# Patient Record
Sex: Female | Born: 1950
Health system: Southern US, Community
[De-identification: ages and names within clinical notes are randomized; demographics above are authoritative.]

## PROBLEM LIST (undated history)

## (undated) DIAGNOSIS — M7121 Synovial cyst of popliteal space [Baker], right knee: Secondary | ICD-10-CM

## (undated) DIAGNOSIS — E119 Type 2 diabetes mellitus without complications: Secondary | ICD-10-CM

## (undated) DIAGNOSIS — I951 Orthostatic hypotension: Secondary | ICD-10-CM

## (undated) DIAGNOSIS — J019 Acute sinusitis, unspecified: Secondary | ICD-10-CM

## (undated) DIAGNOSIS — E559 Vitamin D deficiency, unspecified: Secondary | ICD-10-CM

## (undated) DIAGNOSIS — G4733 Obstructive sleep apnea (adult) (pediatric): Secondary | ICD-10-CM

## (undated) DIAGNOSIS — R030 Elevated blood-pressure reading, without diagnosis of hypertension: Secondary | ICD-10-CM

## (undated) DIAGNOSIS — I639 Cerebral infarction, unspecified: Secondary | ICD-10-CM

## (undated) DIAGNOSIS — G5601 Carpal tunnel syndrome, right upper limb: Secondary | ICD-10-CM

## (undated) DIAGNOSIS — J9801 Acute bronchospasm: Secondary | ICD-10-CM

## (undated) DIAGNOSIS — J069 Acute upper respiratory infection, unspecified: Secondary | ICD-10-CM

## (undated) DIAGNOSIS — B001 Herpesviral vesicular dermatitis: Secondary | ICD-10-CM

## (undated) DIAGNOSIS — F411 Generalized anxiety disorder: Secondary | ICD-10-CM

## (undated) DIAGNOSIS — R002 Palpitations: Secondary | ICD-10-CM

## (undated) DIAGNOSIS — G44209 Tension-type headache, unspecified, not intractable: Secondary | ICD-10-CM

## (undated) DIAGNOSIS — E785 Hyperlipidemia, unspecified: Secondary | ICD-10-CM

## (undated) DIAGNOSIS — K5790 Diverticulosis of intestine, part unspecified, without perforation or abscess without bleeding: Secondary | ICD-10-CM

## (undated) DIAGNOSIS — F39 Unspecified mood [affective] disorder: Secondary | ICD-10-CM

## (undated) DIAGNOSIS — S93401A Sprain of unspecified ligament of right ankle, initial encounter: Secondary | ICD-10-CM

## (undated) DIAGNOSIS — K219 Gastro-esophageal reflux disease without esophagitis: Secondary | ICD-10-CM

## (undated) DIAGNOSIS — J4530 Mild persistent asthma, uncomplicated: Secondary | ICD-10-CM

## (undated) DIAGNOSIS — R202 Paresthesia of skin: Secondary | ICD-10-CM

## (undated) DIAGNOSIS — I499 Cardiac arrhythmia, unspecified: Secondary | ICD-10-CM

## (undated) DIAGNOSIS — F4321 Adjustment disorder with depressed mood: Secondary | ICD-10-CM

## (undated) DIAGNOSIS — J309 Allergic rhinitis, unspecified: Secondary | ICD-10-CM

## (undated) DIAGNOSIS — R29898 Other symptoms and signs involving the musculoskeletal system: Secondary | ICD-10-CM

## (undated) DIAGNOSIS — R531 Weakness: Secondary | ICD-10-CM

## (undated) DIAGNOSIS — J189 Pneumonia, unspecified organism: Secondary | ICD-10-CM

## (undated) DIAGNOSIS — J45909 Unspecified asthma, uncomplicated: Secondary | ICD-10-CM

## (undated) DIAGNOSIS — G479 Sleep disorder, unspecified: Secondary | ICD-10-CM

## (undated) DIAGNOSIS — F32A Depression, unspecified: Secondary | ICD-10-CM

## (undated) DIAGNOSIS — M9981 Other biomechanical lesions of cervical region: Secondary | ICD-10-CM

## (undated) HISTORY — DX: Obstructive sleep apnea (adult) (pediatric): G47.33

## (undated) HISTORY — DX: Gastro-esophageal reflux disease without esophagitis: K21.9

## (undated) HISTORY — DX: Other symptoms and signs involving the musculoskeletal system: R29.898

## (undated) HISTORY — PX: AUGMENTATION MAMMAPLASTY: SUR837

## (undated) HISTORY — DX: Hyperlipidemia, unspecified: E78.5

## (undated) HISTORY — DX: Cerebral infarction, unspecified: I63.9

## (undated) HISTORY — DX: Other biomechanical lesions of cervical region: M99.81

## (undated) HISTORY — DX: Depression, unspecified: F32.A

## (undated) HISTORY — DX: Mild persistent asthma, uncomplicated: J45.30

## (undated) HISTORY — DX: Paresthesia of skin: R20.2

## (undated) HISTORY — DX: Diverticulosis of intestine, part unspecified, without perforation or abscess without bleeding: K57.90

## (undated) HISTORY — DX: Palpitations: R00.2

## (undated) HISTORY — PX: COLONOSCOPY: SHX174

## (undated) HISTORY — PX: TRANSTHORACIC ECHOCARDIOGRAM: SHX275

---
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
## 1984-08-02 HISTORY — PX: BREAST ENHANCEMENT SURGERY: SHX7

## 1991-08-03 HISTORY — PX: ABDOMINAL HYSTERECTOMY: SHX81

## 1998-07-10 ENCOUNTER — Other Ambulatory Visit: Admission: RE | Admit: 1998-07-10 | Discharge: 1998-07-10 | Payer: Self-pay | Admitting: *Deleted

## 1999-09-07 ENCOUNTER — Other Ambulatory Visit: Admission: RE | Admit: 1999-09-07 | Discharge: 1999-09-07 | Payer: Self-pay | Admitting: *Deleted

## 2000-10-17 ENCOUNTER — Other Ambulatory Visit: Admission: RE | Admit: 2000-10-17 | Discharge: 2000-10-17 | Payer: Self-pay | Admitting: Internal Medicine

## 2001-03-16 ENCOUNTER — Encounter: Payer: Self-pay | Admitting: Cardiology

## 2001-03-16 ENCOUNTER — Inpatient Hospital Stay (HOSPITAL_COMMUNITY): Admission: EM | Admit: 2001-03-16 | Discharge: 2001-03-17 | Payer: Self-pay | Admitting: Emergency Medicine

## 2001-03-16 ENCOUNTER — Encounter: Payer: Self-pay | Admitting: Emergency Medicine

## 2001-03-17 ENCOUNTER — Encounter: Payer: Self-pay | Admitting: Cardiology

## 2009-09-19 ENCOUNTER — Encounter (INDEPENDENT_AMBULATORY_CARE_PROVIDER_SITE_OTHER): Payer: Self-pay | Admitting: *Deleted

## 2010-05-06 ENCOUNTER — Telehealth: Payer: Self-pay | Admitting: Internal Medicine

## 2010-09-03 NOTE — Progress Notes (Signed)
Summary: Schedule Colonoscopy  Phone Note Outgoing Call Call back at [facility] Phone [phone]   Call placed by: Harlow Mares CMA Duncan Dull),  May 06, 2010 10:32 AM Call placed to: Patient Summary of Call: Patients number is disconnected, we will mail them a letter to remind them they are due for their procedure and they need to call back and schedule.   Initial call taken by: Harlow Mares CMA (AAMA),  May 06, 2010 10:32 AM

## 2010-09-03 NOTE — Letter (Signed)
Summary: Colonoscopy Letter  [facility]  [address] [place], [place] [address]   Phone: [phone]  Fax: [phone]      September 19, 2009 MRN: [id number]   LYNDEL DANCEL [address] [place], [place]  [address]   Dear Ms. Larinda Buttery,   According to your medical record, it is time for you to schedule a Colonoscopy. The American Cancer Society recommends this procedure as a method to detect early colon cancer. Patients with a family history of colon cancer, or a personal history of colon polyps or inflammatory bowel disease are at increased risk.  This letter has beeen generated based on the recommendations made at the time of your procedure. If you feel that in your particular situation this may no longer apply, please contact our office.  Please call our office at [phone] to schedule this appointment or to update your records at your earliest convenience.  Thank you for cooperating with [place] to provide you with the very best care possible.   Sincerely,  Wilhemina Bonito. Marina Goodell, M.D.  [facility] [phone]

## 2010-11-06 ENCOUNTER — Emergency Department (INDEPENDENT_AMBULATORY_CARE_PROVIDER_SITE_OTHER): Payer: No Typology Code available for payment source

## 2010-11-06 ENCOUNTER — Emergency Department (HOSPITAL_BASED_OUTPATIENT_CLINIC_OR_DEPARTMENT_OTHER)
Admission: EM | Admit: 2010-11-06 | Discharge: 2010-11-06 | Disposition: A | Discharge status: Home / Self Care | Payer: No Typology Code available for payment source | Attending: Emergency Medicine | Admitting: Emergency Medicine

## 2010-11-06 DIAGNOSIS — M545 Low back pain, unspecified: Secondary | ICD-10-CM | POA: Insufficient documentation

## 2010-11-06 DIAGNOSIS — R079 Chest pain, unspecified: Secondary | ICD-10-CM | POA: Insufficient documentation

## 2010-11-06 DIAGNOSIS — M542 Cervicalgia: Secondary | ICD-10-CM | POA: Insufficient documentation

## 2010-11-06 DIAGNOSIS — E119 Type 2 diabetes mellitus without complications: Secondary | ICD-10-CM | POA: Insufficient documentation

## 2010-11-06 DIAGNOSIS — Y92410 Unspecified street and highway as the place of occurrence of the external cause: Secondary | ICD-10-CM | POA: Insufficient documentation

## 2011-08-30 ENCOUNTER — Ambulatory Visit: Payer: Self-pay

## 2011-08-30 DIAGNOSIS — E1065 Type 1 diabetes mellitus with hyperglycemia: Secondary | ICD-10-CM

## 2011-08-30 DIAGNOSIS — F411 Generalized anxiety disorder: Secondary | ICD-10-CM

## 2011-08-30 DIAGNOSIS — J019 Acute sinusitis, unspecified: Secondary | ICD-10-CM

## 2011-09-05 ENCOUNTER — Telehealth: Payer: Self-pay

## 2011-09-05 NOTE — Telephone Encounter (Signed)
Pt called stating that new diabetes medication is giving her jaw pain and swelling. Pt states it's like lockjaw. Pt would like to know what she should do. She saw Dr. Milus Glazier about a week ago. Please contact pt at [phone].   [facility] in [place]

## 2011-09-06 NOTE — Telephone Encounter (Signed)
Called pt back and gave her instructions from Dr. Milus Glazier. Pt agreed and will RTC Wed.

## 2011-09-06 NOTE — Telephone Encounter (Signed)
Pt states she started taking Onglyza on 1/29 and this past Saturday started having swelling from L ear, all along L jaw into neck and it is painful in this area, as well. No swelling anywhere else, except maybe a little tight in hands. Started taking Amox at same time but has taken several times in past w/ no SEs. Pt stated swelling got a little better, and then worse again after another dose of Onglyza. Pt stopped taking. Please advise.

## 2011-09-06 NOTE — Telephone Encounter (Signed)
Patient to stop the onglyza for 2 days.  Monitor blood sugar.  Return on Wednesday with BS results (bid testing)

## 2011-09-10 ENCOUNTER — Other Ambulatory Visit: Payer: Self-pay | Admitting: Family Medicine

## 2011-09-15 ENCOUNTER — Telehealth: Payer: Self-pay

## 2011-09-15 NOTE — Telephone Encounter (Signed)
Pt has finished antibotic  Thinks she is worse would like more antibotics [phone] [facility], [place], [place]

## 2011-09-16 NOTE — Telephone Encounter (Signed)
SPOKE WITH PATIENT--SHE STATES SHE STILL HAS GREEN MUCOUS AND STILL FEELS CLOGGED UP IN SINUSES AND EARS. PLEASE ADVISE.

## 2011-09-18 ENCOUNTER — Emergency Department (HOSPITAL_BASED_OUTPATIENT_CLINIC_OR_DEPARTMENT_OTHER)
Admission: EM | Admit: 2011-09-18 | Discharge: 2011-09-19 | Disposition: A | Discharge status: Home / Self Care | Payer: Self-pay | Attending: Emergency Medicine | Admitting: Emergency Medicine

## 2011-09-18 ENCOUNTER — Encounter (HOSPITAL_BASED_OUTPATIENT_CLINIC_OR_DEPARTMENT_OTHER): Payer: Self-pay | Admitting: *Deleted

## 2011-09-18 ENCOUNTER — Emergency Department (INDEPENDENT_AMBULATORY_CARE_PROVIDER_SITE_OTHER): Payer: Self-pay

## 2011-09-18 DIAGNOSIS — R51 Headache: Secondary | ICD-10-CM

## 2011-09-18 DIAGNOSIS — M25559 Pain in unspecified hip: Secondary | ICD-10-CM

## 2011-09-18 DIAGNOSIS — W19XXXA Unspecified fall, initial encounter: Secondary | ICD-10-CM

## 2011-09-18 DIAGNOSIS — E119 Type 2 diabetes mellitus without complications: Secondary | ICD-10-CM | POA: Insufficient documentation

## 2011-09-18 DIAGNOSIS — M542 Cervicalgia: Secondary | ICD-10-CM | POA: Insufficient documentation

## 2011-09-18 DIAGNOSIS — S0990XA Unspecified injury of head, initial encounter: Secondary | ICD-10-CM

## 2011-09-18 DIAGNOSIS — W010XXA Fall on same level from slipping, tripping and stumbling without subsequent striking against object, initial encounter: Secondary | ICD-10-CM | POA: Insufficient documentation

## 2011-09-18 DIAGNOSIS — M549 Dorsalgia, unspecified: Secondary | ICD-10-CM

## 2011-09-18 DIAGNOSIS — J45909 Unspecified asthma, uncomplicated: Secondary | ICD-10-CM | POA: Insufficient documentation

## 2011-09-18 DIAGNOSIS — Y92009 Unspecified place in unspecified non-institutional (private) residence as the place of occurrence of the external cause: Secondary | ICD-10-CM | POA: Insufficient documentation

## 2011-09-18 DIAGNOSIS — M79609 Pain in unspecified limb: Secondary | ICD-10-CM | POA: Insufficient documentation

## 2011-09-18 DIAGNOSIS — W1809XA Striking against other object with subsequent fall, initial encounter: Secondary | ICD-10-CM

## 2011-09-18 HISTORY — DX: Cardiac arrhythmia, unspecified: I49.9

## 2011-09-18 MED ORDER — ONDANSETRON 8 MG PO TBDP
ORAL_TABLET | ORAL | Status: AC
  Filled 2011-09-18: qty 1

## 2011-09-18 MED ORDER — ONDANSETRON 8 MG PO TBDP: 8.0000 mg | ORAL_TABLET | Freq: Two times a day (BID) | ORAL | Status: AC | PRN

## 2011-09-18 MED ORDER — HYDROCODONE-ACETAMINOPHEN 5-325 MG PO TABS
2.0000 | ORAL_TABLET | Freq: Once | ORAL | Status: AC
  Administered 2011-09-18: 2 via ORAL

## 2011-09-18 MED ORDER — HYDROCODONE-ACETAMINOPHEN 5-325 MG PO TABS
ORAL_TABLET | ORAL | Status: AC
  Administered 2011-09-18: 2 via ORAL
  Filled 2011-09-18: qty 2

## 2011-09-18 MED ORDER — ONDANSETRON 8 MG PO TBDP
8.0000 mg | ORAL_TABLET | Freq: Once | ORAL | Status: AC
  Administered 2011-09-18: 8 mg via ORAL

## 2011-09-18 MED ORDER — HYDROCODONE-ACETAMINOPHEN 5-325 MG PO TABS: 1.0000 | ORAL_TABLET | ORAL | Status: AC | PRN

## 2011-09-18 NOTE — Discharge Instructions (Signed)
Head Injury, Adult You have had a head injury that does not appear serious at this time. A concussion is a state of changed mental ability, usually from a blow to the head. You should take clear liquids for the rest of the day and then resume your regular diet. You should not take sedatives or alcoholic beverages for as long as directed by your caregiver after discharge. After injuries such as yours, most problems occur within the first 24 hours. SYMPTOMS These minor symptoms may be experienced after discharge:  Memory difficulties.   Dizziness.   Headaches.   Double vision.   Hearing difficulties.   Depression.   Tiredness.   Weakness.   Difficulty with concentration.  If you experience any of these problems, you should not be alarmed. A concussion requires a few days for recovery. Many patients with head injuries frequently experience such symptoms. Usually, these problems disappear without medical care. If symptoms last for more than one day, notify your caregiver. See your caregiver sooner if symptoms are becoming worse rather than better. HOME CARE INSTRUCTIONS   During the next 24 hours you must stay with someone who can watch you for the warning signs listed below.  Although it is unlikely that serious side effects will occur, you should be aware of signs and symptoms which may necessitate your return to this location. Side effects may occur up to 7 - 10 days following the injury. It is important for you to carefully monitor your condition and contact your caregiver or seek immediate medical attention if there is a change in your condition. SEEK IMMEDIATE MEDICAL CARE IF:   There is confusion or drowsiness.   You can not awaken the injured person.   There is nausea (feeling sick to your stomach) or continued, forceful vomiting.   You notice dizziness or unsteadiness which is getting worse, or inability to walk.   You have convulsions or unconsciousness.   You experience  severe, persistent headaches not relieved by over-the-counter or prescription medicines for pain. (Do not take aspirin as this impairs clotting abilities). Take other pain medications only as directed.   You can not use arms or legs normally.   There is clear or bloody discharge from the nose or ears.  MAKE SURE YOU:   Understand these instructions.   Will watch your condition.   Will get help right away if you are not doing well or get worse.  Document Released: 07/19/2005 Document Revised: 03/31/2011 Document Reviewed: 06/06/2009 [facility] Patient Information 2012 [place], [place].   Your blood pressure was mildly elevated today at 152/72. Get your blood pressure rechecked within the next 3 weeks. 8 Tylenol for mild pain or the pain medicine prescribed for bad pain. Return if her condition worsens for any reason

## 2011-09-18 NOTE — ED Notes (Signed)
Pt states she was walking down a ramp and slipped and fell hitting her head. Now c/o head, neck left side pain. Hurts to breathe. Both hips. Placed in c-collar at triage. ?LOC. PERL Ambulatory with limp to ED

## 2011-09-18 NOTE — ED Provider Notes (Addendum)
History    Scribed for Doug Sou, MD, the patient was seen in room MH07/MH07. This chart was scribed by Katha Cabal.   CSN: [id number]  Arrival date & time 09/18/11  2032   None     Chief Complaint  Patient presents with  . Fall    (Consider location/radiation/quality/duration/timing/severity/associated sxs/prior treatment) HPI Wendy Mcclain is a 61 y.o. female who presents to the Emergency Department for fall between 5:30 and 6 PM.  Patient was walking down the ramp on her back porch.  Patient did not see ice on ramp and feet went up from under her. Patient was helped up by a friend.  Patient landed flat on her back.  Patient hit head during fall.   Reports pain in bilateral hips, mid back, left neck and left middle finger.  Patient took ibuprofen prior to arrival.  Pain rated 6/10.  Pain worse with movement and some relief with rest.  Patient was baseline prior to fall.  Patient with history of asthma, diabetes mellitus and heart arrythmia.  Patient is not taking blood thinners.       Past Medical History  Diagnosis Date  . Diabetes mellitus   . Arrhythmia   . Asthma     Past Surgical History  Procedure Date  . Appendectomy   . Abdominal hysterectomy   . Breast surgery     History reviewed. No pertinent family history.  History  Substance Use Topics  . Smoking status: Never Smoker   . Smokeless tobacco: Not on file  . Alcohol Use: No    OB History    Grav Para Term Preterm Abortions TAB SAB Ect Mult Living                  Review of Systems  Constitutional: Negative.   HENT: Positive for neck pain.   Respiratory: Negative.   Cardiovascular: Negative.   Gastrointestinal: Negative.   Musculoskeletal: Positive for back pain.       Bilateral hip pain, left middle finger pain,   Skin: Negative.   Neurological: Negative.   Hematological: Negative.   Psychiatric/Behavioral: Negative.     Allergies  Review of patient's allergies indicates no known  allergies.  Home Medications   Current Outpatient Rx  Name Route Sig Dispense Refill  . GLIPIZIDE 10 MG PO TABS  TAKE ONE TABLET TWICE A DAY 60 tablet 2  . ADULT MULTIVITAMIN W/MINERALS CH Oral Take 1 tablet by mouth daily.    Marland Kitchen NAPROXEN SODIUM 220 MG PO TABS Oral Take 220 mg by mouth once.      BP 167/63  Pulse 80  Temp(Src) 97.8 F (36.6 C) (Oral)  Resp 20  Ht 5\' 3"  (1.6 m)  Wt 132 lb (59.875 kg)  BMI 23.38 kg/m2  SpO2 99%  Physical Exam  Nursing note and vitals reviewed. Constitutional: She appears well-developed and well-nourished.  HENT:  Head: Normocephalic and atraumatic.  Right Ear: Tympanic membrane normal. No hemotympanum.  Left Ear: Tympanic membrane normal. No hemotympanum.       Tender at occiput, no hematoma  Eyes: Conjunctivae are normal. Pupils are equal, round, and reactive to light.  Neck: Neck supple. No tracheal deviation present. No thyromegaly present.       C spine tenderness  Cardiovascular: Normal rate and regular rhythm.   No murmur heard. Pulmonary/Chest: Effort normal and breath sounds normal.       Posterior chest tenderness left greater than right, no flail, no crepitus, no ecchymosis,  Abdominal: Soft. Bowel sounds are normal. She exhibits no distension. There is no tenderness.  Musculoskeletal: Normal range of motion. She exhibits no edema and no tenderness.       Tender over iliac crest bilaterally, hands non tender with FROM bilaterally, extremities non tender   Neurological: She is alert. Coordination normal.       Normal gait,   Skin: Skin is warm and dry. No rash noted.  Psychiatric: She has a normal mood and affect.    ED Course  Procedures (including critical care time)   DIAGNOSTIC STUDIES: Oxygen Saturation is 99% on room air, normal by my interpretation.     COORDINATION OF CARE:  9:59 PM  Physical exam complete.  Pain control.  Will xray pelvis, ribs, CT head and CT cervical spine.   10:01 PM  Norco ordered.      11:40 PM Feels improved after treatment with hydrocodone-A. Pap alert Glasgow Coma Score 15  LABS / RADIOLOGY:   Labs Reviewed - No data to display Dg Ribs Unilateral W/chest Left  09/18/2011  *RADIOLOGY REPORT*  Clinical Data: Status post fall; upper back pain.  LEFT RIBS AND CHEST - 3+ VIEW  Comparison: Chest radiograph performed 11/06/2010  Findings: No displaced rib fractures are seen.  The lungs are well-aerated and clear.  There is no evidence of focal opacification, pleural effusion or pneumothorax.  The cardiomediastinal silhouette is within normal limits.  No acute osseous abnormalities are seen.  Bilateral peripherally calcified breast implants are noted.  IMPRESSION:  1.  No displaced rib fractures seen. 2.  No acute cardiopulmonary process identified.  Original Report Authenticated By: Tonia Ghent, M.D.   Dg Ribs Unilateral W/chest Right  09/18/2011  *RADIOLOGY REPORT*  Clinical Data: Status post fall; landed on back, with back pain.  RIGHT RIBS AND CHEST - 3+ VIEW  Comparison: Chest radiograph performed 11/06/2010  Findings: No displaced rib fractures are seen.  The lungs are well-aerated and clear.  There is no evidence of focal opacification, pleural effusion or pneumothorax.  The cardiomediastinal silhouette is within normal limits.  No acute osseous abnormalities are seen.  Bilateral calcified breast implants are noted.  IMPRESSION:  1.  No displaced rib fractures seen. 2.  No acute cardiopulmonary process identified.  Original Report Authenticated By: Tonia Ghent, M.D.   Dg Pelvis 1-2 Views  09/18/2011  *RADIOLOGY REPORT*  Clinical Data: Status post fall; bilateral hip pain.  PELVIS - 1-2 VIEW  Comparison: Lumbar spine radiographs performed 11/06/2010  Findings: There is no evidence of fracture or dislocation.  Both femoral heads are seated normally within their respective acetabula.  No significant degenerative change is appreciated.  The sacroiliac joints are unremarkable in  appearance.  Sclerotic change is noted at the pubic symphysis.  The visualized bowel gas pattern is grossly unremarkable in appearance.  Scattered phleboliths are noted within the pelvis.  IMPRESSION: No evidence of fracture or dislocation.  Original Report Authenticated By: Tonia Ghent, M.D.   Ct Head Wo Contrast  09/18/2011  *RADIOLOGY REPORT*  Clinical Data:  Status post fall; hit head, with headache and left neck pain.  CT HEAD WITHOUT CONTRAST AND CT CERVICAL SPINE WITHOUT CONTRAST  Technique:  Multidetector CT imaging of the head and cervical spine was performed following the standard protocol without intravenous contrast.  Multiplanar CT image reconstructions of the cervical spine were also generated.  Comparison: Cervical spine radiographs performed 11/06/2010  CT HEAD  Findings: There is no evidence of acute infarction, mass lesion,  or intra- or extra-axial hemorrhage on CT.  The posterior fossa, including the cerebellum, brainstem and fourth ventricle, is within normal limits.  The third and lateral ventricles, and basal ganglia are unremarkable in appearance.  The cerebral hemispheres are symmetric in appearance, with normal gray- white differentiation.  No mass effect or midline shift is seen.  There is no evidence of fracture; visualized osseous structures are unremarkable in appearance.  The orbits are within normal limits. The paranasal sinuses and mastoid air cells are well-aerated.  No significant soft tissue abnormalities are seen.  IMPRESSION: No evidence of traumatic intracranial injury or fracture.  CT CERVICAL SPINE  Findings: There is no evidence of fracture or subluxation. Vertebral bodies demonstrate normal height and alignment. Intervertebral disc spaces are preserved.  Prevertebral soft tissues are within normal limits.  The visualized neural foramina are grossly unremarkable.  The thyroid gland is unremarkable in appearance.  The visualized lung apices are clear.  No significant soft  tissue abnormalities are seen.  IMPRESSION: No evidence of fracture or subluxation along the cervical spine.  Original Report Authenticated By: Tonia Ghent, M.D.   Ct Cervical Spine Wo Contrast  09/18/2011  *RADIOLOGY REPORT*  Clinical Data:  Status post fall; hit head, with headache and left neck pain.  CT HEAD WITHOUT CONTRAST AND CT CERVICAL SPINE WITHOUT CONTRAST  Technique:  Multidetector CT imaging of the head and cervical spine was performed following the standard protocol without intravenous contrast.  Multiplanar CT image reconstructions of the cervical spine were also generated.  Comparison: Cervical spine radiographs performed 11/06/2010  CT HEAD  Findings: There is no evidence of acute infarction, mass lesion, or intra- or extra-axial hemorrhage on CT.  The posterior fossa, including the cerebellum, brainstem and fourth ventricle, is within normal limits.  The third and lateral ventricles, and basal ganglia are unremarkable in appearance.  The cerebral hemispheres are symmetric in appearance, with normal gray- white differentiation.  No mass effect or midline shift is seen.  There is no evidence of fracture; visualized osseous structures are unremarkable in appearance.  The orbits are within normal limits. The paranasal sinuses and mastoid air cells are well-aerated.  No significant soft tissue abnormalities are seen.  IMPRESSION: No evidence of traumatic intracranial injury or fracture.  CT CERVICAL SPINE  Findings: There is no evidence of fracture or subluxation. Vertebral bodies demonstrate normal height and alignment. Intervertebral disc spaces are preserved.  Prevertebral soft tissues are within normal limits.  The visualized neural foramina are grossly unremarkable.  The thyroid gland is unremarkable in appearance.  The visualized lung apices are clear.  No significant soft tissue abnormalities are seen.  IMPRESSION: No evidence of fracture or subluxation along the cervical spine.  Original  Report Authenticated By: Tonia Ghent, M.D.     No diagnosis found.    MDM  Plan prescription hydrocodone-A. Pap Blood pressure recheck within 3 weeks Diagnosis #1 fall #2 minor closed head injury # 3 contusions to multiple sites #4 elevated blood pressure   I personally performed the services described in this documentation, which was scribed in my presence. The recorded information has been reviewed and considered.   Doug Sou, MD 09/18/11 2345  11:50 PM addendum: Patient vomited upon getting up to be discharged, presumably from Vicodin. May be affect of mild concussion. Remains alert and appropriate. Zofran ordered and prescription written  for Zofran  Doug Sou, MD 09/18/11 2356

## 2011-10-27 ENCOUNTER — Encounter: Payer: Self-pay | Admitting: Sports Medicine

## 2011-10-27 ENCOUNTER — Other Ambulatory Visit: Payer: Self-pay | Admitting: Sports Medicine

## 2011-10-27 ENCOUNTER — Ambulatory Visit (INDEPENDENT_AMBULATORY_CARE_PROVIDER_SITE_OTHER): Payer: No Typology Code available for payment source | Admitting: Sports Medicine

## 2011-10-27 VITALS — BP 124/76 | Ht 63.0 in | Wt 133.0 lb

## 2011-10-27 DIAGNOSIS — F39 Unspecified mood [affective] disorder: Secondary | ICD-10-CM

## 2011-10-27 DIAGNOSIS — Z0000 Encounter for general adult medical examination without abnormal findings: Secondary | ICD-10-CM

## 2011-10-27 DIAGNOSIS — E119 Type 2 diabetes mellitus without complications: Secondary | ICD-10-CM

## 2011-10-27 MED ORDER — PAROXETINE HCL 20 MG PO TABS: 20.0000 mg | ORAL_TABLET | ORAL | Status: DC

## 2011-10-27 NOTE — Patient Instructions (Signed)
It was nice to meet you today.  I am sending in a prescription for Paxil to your pharmacy.  Please call your office if you have any problems with this new medication.   Please be in touch with Rudell Cobb regarding the orange card.  We will get labs at your next visit to see how your diabetes is doing.  I will plan on seeing you back in 1 week to check on your mood

## 2011-11-01 ENCOUNTER — Encounter: Payer: Self-pay | Admitting: Internal Medicine

## 2011-11-05 ENCOUNTER — Ambulatory Visit: Payer: No Typology Code available for payment source | Admitting: Sports Medicine

## 2011-11-11 ENCOUNTER — Ambulatory Visit: Payer: No Typology Code available for payment source | Admitting: Sports Medicine

## 2011-11-18 NOTE — Progress Notes (Signed)
HPI:  Wendy Mcclain is a 61 y.o. female presenting today to establish care.  [facility] as well as by Dr. Marina Goodell GI.  She currently reports that she has had depressed mood lately since losing her husband.  She is currently living by herself with plans to move in with her daughter.  She lives in [place] with family nearby.  She has 6 grandchildren.  Significant past medical history includes asthma diabetes reflux multiple benign polyps on colonoscopy, and surgical history including breast augmentation appendectomy, tubal ligation, hysterectomy.    She reports that her mood has been persistently depressed scoring a 7 on the PhD 9 with somewhat difficulty in her life.  She does not feel that this is coping thinning as it has been greater than 4 months.  She reports anhedonia as well as persistent fatigue.  She's had trouble sleeping and has been using "simply sleep."  ROS Negative for chest pain, suicidal ideation, shortness of breath, cough, fevers, chills.  Past Medical Hx Reviewed: yes, as above and in history -  Medications Reviewed: yes Family History Reviewed: yes, please see the history section   PE: GENERAL:    Caucasian  female.  Examined in [facility] .   syncopal in exam room chair   In mild discomfort; norespiratory distress.   PSYCH: Alert and appropriately interactive, tearful when discussing her husband ,  affect is flattened,  HNEENT: AT/[place], MMM, no scleral icterus, EOMi THORAX: HEART: RRR, S1/S2 heard, no murmur LUNGS: CTA B, no wheezes, no crackles ABDOMEN:  +BS, soft, non-tender, no rigidity, no guarding, no masses/organomegaly EXTREMITIES: Moves all 4 extremities spontaneously, warm well perfused, no edema, bilateral DP and PT pulses 2/4.

## 2011-11-23 ENCOUNTER — Encounter: Payer: Self-pay | Admitting: Sports Medicine

## 2011-11-23 DIAGNOSIS — F39 Unspecified mood [affective] disorder: Secondary | ICD-10-CM

## 2011-11-23 DIAGNOSIS — Z0000 Encounter for general adult medical examination without abnormal findings: Secondary | ICD-10-CM | POA: Insufficient documentation

## 2011-11-23 DIAGNOSIS — E119 Type 2 diabetes mellitus without complications: Secondary | ICD-10-CM

## 2011-11-23 HISTORY — DX: Type 2 diabetes mellitus without complications: E11.9

## 2011-11-23 HISTORY — DX: Unspecified mood (affective) disorder: F39

## 2011-11-23 NOTE — Assessment & Plan Note (Addendum)
Will continue glipizide.  Will check A1c at next visit when patient has orange card, reports compliance and her hemoglobin A1c has recently been close to goal

## 2011-11-23 NOTE — Assessment & Plan Note (Signed)
Patient with evidence of subclinical depressive disorder with question of MDD.  Patient is beyond normal coping timeframe and will likely benefit from SSRI.  Will start.  Will followup in one week.

## 2011-11-23 NOTE — Assessment & Plan Note (Addendum)
will plan to perform Pap smear at next visit. Discuss multiple facets of healthcare maintenance with her especially in the setting of her recent loss of her husband patient.  Patient is willing to undergo mammogram and  return for colonoscopy however financial issues have been prohibitive

## 2011-12-11 ENCOUNTER — Other Ambulatory Visit: Payer: Self-pay | Admitting: Physician Assistant

## 2011-12-13 ENCOUNTER — Other Ambulatory Visit: Payer: Self-pay | Admitting: Physician Assistant

## 2012-01-23 ENCOUNTER — Other Ambulatory Visit: Payer: Self-pay | Admitting: Physician Assistant

## 2012-03-01 ENCOUNTER — Ambulatory Visit: Payer: Self-pay | Admitting: Family Medicine

## 2012-03-01 VITALS — BP 120/75 | HR 72 | Temp 98.0°F | Resp 16 | Ht 62.5 in | Wt 131.0 lb

## 2012-03-01 DIAGNOSIS — J329 Chronic sinusitis, unspecified: Secondary | ICD-10-CM

## 2012-03-01 DIAGNOSIS — F3289 Other specified depressive episodes: Secondary | ICD-10-CM

## 2012-03-01 DIAGNOSIS — G4700 Insomnia, unspecified: Secondary | ICD-10-CM

## 2012-03-01 DIAGNOSIS — F329 Major depressive disorder, single episode, unspecified: Secondary | ICD-10-CM

## 2012-03-01 DIAGNOSIS — E119 Type 2 diabetes mellitus without complications: Secondary | ICD-10-CM

## 2012-03-01 DIAGNOSIS — F32A Depression, unspecified: Secondary | ICD-10-CM

## 2012-03-01 DIAGNOSIS — F4321 Adjustment disorder with depressed mood: Secondary | ICD-10-CM

## 2012-03-01 LAB — POCT GLYCOSYLATED HEMOGLOBIN (HGB A1C): Hemoglobin A1C: 9.8

## 2012-03-01 MED ORDER — CITALOPRAM HYDROBROMIDE 20 MG PO TABS: 20.0000 mg | ORAL_TABLET | Freq: Every day | ORAL | Status: AC

## 2012-03-01 MED ORDER — GLIPIZIDE 10 MG PO TABS: 10.0000 mg | ORAL_TABLET | Freq: Two times a day (BID) | ORAL | Status: DC

## 2012-03-01 MED ORDER — ZOLPIDEM TARTRATE 5 MG PO TABS: 5.0000 mg | ORAL_TABLET | Freq: Every evening | ORAL | Status: AC | PRN

## 2012-03-01 MED ORDER — AZITHROMYCIN 250 MG PO TABS: ORAL_TABLET | ORAL | Status: AC

## 2012-03-01 MED ORDER — METFORMIN HCL ER 500 MG PO TB24: 500.0000 mg | ORAL_TABLET | Freq: Every day | ORAL | Status: AC

## 2012-03-01 NOTE — Progress Notes (Signed)
[facility]:  Office Visit  Chief Complaint:  Chief Complaint  Patient presents with  . Diabetes    med refill  . Nasal Congestion    x couple months  . Insomnia    HPI: Wendy Mcclain is a 61 y.o. female who complains of : 1. DM-not on Olngyza, not on metformin, only on glipizide 2. Insmonia-difficulty sleeping only some nights, falling asleep and staying asleep 3. Grieving for husband from cancer, was put on Celexa and paxil but did not take either, afraid of meds  Past Medical History  Diagnosis Date  . Diabetes mellitus   . Arrhythmia   . Asthma    Past Surgical History  Procedure Date  . Appendectomy   . Abdominal hysterectomy   . Breast surgery    History   Social History  . Marital Status: Married    Spouse Name: N/A    Number of Children: N/A  . Years of Education: N/A   Social History Main Topics  . Smoking status: Never Smoker   . Smokeless tobacco: Never Used  . Alcohol Use: No  . Drug Use: No  . Sexually Active: None   Other Topics Concern  . None   Social History Narrative   [place]2 daughters - 1 in [place]; 1 in [place] grandchildren   Family History  Problem Relation Age of Onset  . Aortic aneurysm Paternal Aunt     2 aunts died at age 66  . Alzheimer's disease Mother   . Cancer Father     esophageal  . Cancer Sister     stomach  . Cancer Paternal Grandfather     mouth & larynx  . Cancer Paternal Aunt     colon  . Heart disease    . Diabetes    . Hyperlipidemia    . Hypertension    . Stroke    . Thyroid disease Sister    No Known Allergies Prior to Admission medications   Medication Sig Start Date End Date Taking? Authorizing Provider  glipiZIDE (GLUCOTROL) 10 MG tablet Take 1 tablet (10 mg total) by mouth 2 (two) times daily before a meal. NEEDS OFFICE VISIT/LABS 01/23/12  Yes Chelle S Jeffery, PA-C  Multiple Vitamin (MULITIVITAMIN WITH MINERALS) TABS  Take 1 tablet by mouth daily.   Yes Historical Provider, MD  naproxen sodium (ANAPROX) 220 MG tablet Take 220 mg by mouth once.    Historical Provider, MD  PARoxetine (PAXIL) 20 MG tablet TAKE 1 TABLET BY MOUTH EVERY MORNING 10/27/11   Andrena Mews, DO     ROS: The patient denies fevers, chills, night sweats, unintentional weight loss, chest pain, palpitations, wheezing, dyspnea on exertion, nausea, vomiting, abdominal pain, dysuria, hematuria, melena, numbness, weakness, or tingling.   All other systems have been reviewed and were otherwise negative with the exception of those mentioned in the HPI and as above.    PHYSICAL EXAM: Filed Vitals:   03/01/12 1840  BP: 120/75  Pulse: 72  Temp: 98 F (36.7 C)  Resp: 16   Filed Vitals:   03/01/12 1840  Height: 5' 2.5" (1.588 m)  Weight: 131 lb (59.421 kg)   Body mass index is 23.58 kg/(m^2).  General: Alert, no acute distress HEENT:  Normocephalic, atraumatic, oropharynx patent.  Cardiovascular:  Regular rate and rhythm, no rubs murmurs or gallops.  No Carotid bruits, radial pulse intact. No pedal edema.  Respiratory: Clear to auscultation bilaterally.  No wheezes, rales, or rhonchi.  No cyanosis, no use of accessory musculature GI: No organomegaly, abdomen is soft and non-tender, positive bowel sounds.  No masses. Skin: No rashes. Neurologic: Facial musculature symmetric. Psychiatric: Patient is appropriate throughout our interaction. Lymphatic: No cervical lymphadenopathy Musculoskeletal: Gait intact.   LABS: Results for orders placed in visit on 03/01/12  POCT GLYCOSYLATED HEMOGLOBIN (HGB A1C)      Component Value Range   Hemoglobin A1C 9.8       EKG/XRAY:   Primary read interpreted by Dr. Conley Rolls at [facility].   ASSESSMENT/PLAN: Encounter Diagnoses  Name Primary?  . Diabetes mellitus Yes  . Grieving   . Depression   . Insomnia   . Sinusitis     HBA1c too high 9.8 will restart Metformin ER 500 mg BID Start celexa for  depression ( patient never took it when initially rx, did not go to therapy)  Ambien for insomnia Z-pack for sinusitis F/u 3 months   LE, THAO PHUONG, DO 03/01/2012 8:18 PM

## 2012-03-02 ENCOUNTER — Telehealth: Payer: Self-pay

## 2012-03-02 NOTE — Telephone Encounter (Signed)
Pt did not get the ambien, I have called it to her pharmacy from her visit last pm.

## 2012-03-21 ENCOUNTER — Telehealth: Payer: Self-pay

## 2012-03-21 NOTE — Telephone Encounter (Signed)
Wendy Mcclain saw Dr. Conley Rolls 03/01/12 for sinus infection and is now has drainage from (fluid) in ear and pain in jaw.      [facility] [place].    [phone].

## 2012-03-22 MED ORDER — FLUTICASONE PROPIONATE 50 MCG/ACT NA SUSP: 2.0000 | Freq: Every day | NASAL | Status: DC

## 2012-03-22 NOTE — Telephone Encounter (Signed)
Have patient start Mucinex and I will send in some Flonase as well.

## 2012-03-22 NOTE — Telephone Encounter (Signed)
I have called her to advise.  

## 2012-04-24 ENCOUNTER — Telehealth: Payer: Self-pay

## 2012-04-24 NOTE — Telephone Encounter (Signed)
Patient states she is going on a cruise next week and is requesting ear patches for sea sickness.

## 2012-04-25 MED ORDER — SCOPOLAMINE 1 MG/3DAYS TD PT72: 1.0000 | MEDICATED_PATCH | TRANSDERMAL | Status: DC

## 2012-04-25 NOTE — Telephone Encounter (Signed)
Done

## 2012-06-12 ENCOUNTER — Encounter: Payer: Self-pay | Admitting: Home Health Services

## 2012-06-14 ENCOUNTER — Encounter: Payer: Self-pay | Admitting: Home Health Services

## 2012-09-07 ENCOUNTER — Other Ambulatory Visit: Payer: Self-pay | Admitting: Family Medicine

## 2012-09-07 ENCOUNTER — Other Ambulatory Visit: Payer: Self-pay | Admitting: Physician Assistant

## 2012-09-07 NOTE — Telephone Encounter (Signed)
Needs OV/labs, was due for f/u Oct 2013

## 2012-10-18 ENCOUNTER — Encounter: Payer: Self-pay | Admitting: *Deleted

## 2013-06-05 ENCOUNTER — Encounter: Payer: Self-pay | Admitting: Internal Medicine

## 2013-07-09 ENCOUNTER — Encounter: Payer: Self-pay | Admitting: Sports Medicine

## 2013-07-09 ENCOUNTER — Ambulatory Visit (INDEPENDENT_AMBULATORY_CARE_PROVIDER_SITE_OTHER): Payer: BC Managed Care – PPO | Admitting: Sports Medicine

## 2013-07-09 VITALS — BP 136/68 | HR 83 | Temp 97.9°F | Ht 63.0 in | Wt 146.5 lb

## 2013-07-09 DIAGNOSIS — G479 Sleep disorder, unspecified: Secondary | ICD-10-CM | POA: Insufficient documentation

## 2013-07-09 DIAGNOSIS — R030 Elevated blood-pressure reading, without diagnosis of hypertension: Secondary | ICD-10-CM

## 2013-07-09 DIAGNOSIS — J309 Allergic rhinitis, unspecified: Secondary | ICD-10-CM

## 2013-07-09 DIAGNOSIS — E1165 Type 2 diabetes mellitus with hyperglycemia: Secondary | ICD-10-CM

## 2013-07-09 DIAGNOSIS — F39 Unspecified mood [affective] disorder: Secondary | ICD-10-CM

## 2013-07-09 DIAGNOSIS — Z0000 Encounter for general adult medical examination without abnormal findings: Secondary | ICD-10-CM

## 2013-07-09 HISTORY — DX: Elevated blood-pressure reading, without diagnosis of hypertension: R03.0

## 2013-07-09 HISTORY — DX: Allergic rhinitis, unspecified: J30.9

## 2013-07-09 HISTORY — DX: Sleep disorder, unspecified: G47.9

## 2013-07-09 LAB — POCT UA - MICROALBUMIN: Microalbumin Ur, POC: 30 mg/L

## 2013-07-09 MED ORDER — HYDROCHLOROTHIAZIDE 12.5 MG PO TABS: 12.5000 mg | ORAL_TABLET | Freq: Every day | ORAL | Status: DC

## 2013-07-09 MED ORDER — FLUTICASONE PROPIONATE 50 MCG/ACT NA SUSP: 2.0000 | Freq: Every day | NASAL | Status: DC

## 2013-07-09 MED ORDER — INSULIN GLARGINE 100 UNIT/ML ~~LOC~~ SOLN: 32.0000 [IU] | SUBCUTANEOUS | Status: DC

## 2013-07-09 MED ORDER — DIPHENHYDRAMINE HCL (SLEEP) 25 MG PO TABS: 25.0000 mg | ORAL_TABLET | Freq: Every evening | ORAL | Status: DC | PRN

## 2013-07-09 MED ORDER — ADULT MULTIVITAMIN W/MINERALS CH: 1.0000 | ORAL_TABLET | Freq: Every day | ORAL | Status: DC

## 2013-07-09 NOTE — Progress Notes (Signed)
Wendy Mcclain - 62 y.o. female MRN [id number]  Date of birth: [birth year]  CC, HPI, Interval History & ROS  Wendy Mcclain is here today to followup on her chronic medical conditions including:  Diabetes, Depression, sleep disorder  She reports being followed by Dr. Sharl Ma who has recently started her on insulin therapy due to her A1c being greater than 12.  Pt denies hypoglycemic symptoms/episodes.  Most recent CBGs have been between 140 and 220.  No reported polyuria/polydipsia. Pt is compliant with foot exams and denies any new foot lesions or new sensory changes/dysesthesias.  She reports occasional dizzy spells as below  Pt denies chest pain, dyspnea at rest or exertion, PND, lower extremity edema.  No orthostasis  Patient denies any facial asymmetry, unilateral weakness, or dysarthria.  Dr. Sharl Ma wanted her to be on statin therapy however the patient has declined at this time.  She also reports he continues to have difficulty with falling asleep and sleep maintenance.  She is taking diphenhydramine.  She watches TV while trying to fall asleep.  She does have associated weight gain with the insulin.  She has also noted some fluid retention.  Other acute problems include:  Dizziness and Head congestion She has had 2 vertiginous dizzy spells.  She has a hard time describing this but it seems as though the room will spin when she is out walking.  During these 2 episodes she has checked her sugars and they have both been greater than 140  She associates this with worsening of her congestion.  She has not treated her congestion as discussed last year.  Pt denies any fevers, chills, or rigors.  She denies any significant hearing changes Pertinent History & Care Coordination  No Patient Care Coordination Note on file.  History  Smoking status  . Never Smoker   Smokeless tobacco  . Never Used   Health Maintenance Due  Topic  . Mammogram   . Colonoscopy   . Zostavax   . Influenza Vaccine     Recent Labs  07/09/13 0952  HGBA1C 7.5     Otherwise past Medical, Surgical, Social, and Family History Reviewed per EMR Medications and Allergies reviewed and all updated if necessary. Objective Findings  VITALS: HR: 83 bpm  BP: 136/68 mmHg  TEMP: 97.9 F (36.6 C) (Oral)  RESP:    HT: 5\' 3"  (160 cm)  WT: 146 lb 8 oz (66.452 kg)  BMI: 26   BP Readings from Last 3 Encounters:  07/09/13 136/68  03/01/12 120/75  10/27/11 124/76   Wt Readings from Last 3 Encounters:  07/09/13 146 lb 8 oz (66.452 kg)  03/01/12 131 lb (59.421 kg)  10/27/11 133 lb (60.328 kg)     PHYSICAL EXAM: GENERAL:  adult Caucasian female. In no discomfort; no respiratory distress  PSYCH: alert and appropriate, good insight   HNEENT: H&N: AT/, trachea midline  Eyes: no scleral icterus, no conjunctival exudate  Ears: B serous effusion  Nose: Minimal nasal congestion  Oropharynx: MMM  Dentention:     CARDIO: RRR, S1/S2 heard, no murmur  LUNGS: CTA B, no wheezes, no crackles  ABDOMEN: +BS, soft, non-tender, no rigidity, no guarding, no masses/hepatosplenomegaly  EXTREM:  Warm, well perfused.  Moves all 4 extremities spontaneously; no lateralization.  No noted foot lesions.  Sensation intact to monofilament testing throughout bilateral lower extremity.  Distal pulses 2+/4.  1+ pretibial edema.  GU:   SKIN:      Assessment & Plan   Problems addressed  today: General Plan & Pt Instructions:  1. DM (diabetes mellitus), type 2, uncontrolled   2. Allergic rhinitis   3. Sleep disorder   4. Mood disorder   5. Elevated blood pressure (not hypertension)       Try 5mg  of Melatonin 1 hour bed  See below for sleep behaviors  Call back to Dr. Marina Goodell  Flu Shot today  Start Flonase daily.  Follow up with Dr. Sharl Ma as scheduled  Start your Lipitor that Dr. Sharl Ma recommended  Start HCTZ and come back in 1 month for a BP recheck  If the spinning doesn't get better come back sooner     For further  discussion of A/P and for follow up issues see problem based charting.

## 2013-07-09 NOTE — Assessment & Plan Note (Addendum)
Given elevated blood pressure today and slight weight gain with evidence of edema will start on HCTZ.  If microalbumin positive will need ACE inhibitor as well. Encouraged to fill a prescription for statin that Dr. Sharl Ma had recommended.

## 2013-07-09 NOTE — Assessment & Plan Note (Signed)
Seems to be improved 

## 2013-07-09 NOTE — Assessment & Plan Note (Signed)
Given sleep behavior handout and encouraged melatonin

## 2013-07-09 NOTE — Assessment & Plan Note (Signed)
Rx for Flonase again

## 2013-07-09 NOTE — Assessment & Plan Note (Signed)
Encouraged to get her mammogram She is trying to get her colonoscopy arranged. Flu shot today

## 2013-07-09 NOTE — Assessment & Plan Note (Addendum)
Check microalbumin Deferr tx to Dr. Sharl Ma Consider other oral agents given weight gain with insulin

## 2013-07-09 NOTE — Patient Instructions (Signed)
   Try 5mg  of Melatonin 1 hour bed  See below for sleep behaviors  Call back to Dr. Marina Goodell  Flu Shot today  Start Flonase daily.  Follow up with Dr. Sharl Ma as scheduled  Start your Lipitor that Dr. Sharl Ma recommended  Start HCTZ and come back in 1 month for a BP recheck  If the spinning doesn't get better come back sooner    If you need anything prior to your next visit please call the clinic. Please Bring all medications or accurate medication list with you to each appointment; an accurate medication list is essential in providing you the best care possible.     Sleep is an integral part of our bodies ability to recover from our daily activities and is key to making you body perform at its maximal potential.  Establishing and maintaining a healthy sleep pattern can not only make you feel better it can help many chronic illnesses including high blood pressure, high cholesterol, obesity, chronic pain syndromes, and many others.  Some key things to remember regarding sleep are:  Establish a consistent nightly routine that you do each night before bed.  Avoid caffeine, tobacco and alcohol as all of these drugs will cause sleep disturbances.    Establish an exercise routine.  This can be as simple as walking, dancing or what every you find gets your heart rate elevated to the point you can speak in only 3-4 word sentences.  Exercise will help make falling and staying asleep easier.    If you have difficulty with sleep, reserve the bedroom for sleeping; do not watch TV, read, eat or exercise in your bed room.      - Watching TV in bed can trick your brain into thinking it is day time and will reset your internal clock.  If you are going to watch TV before bed do so outside of the bedroom.  Although it is best to avoid screens for the hour prior to bed that is difficult to do in our current technology driven society - at the very least it is imperative that you remove TV from the bed room.  - If you have a hard time falling asleep avoid showering and exercising prior to bed.

## 2013-07-10 ENCOUNTER — Telehealth: Payer: Self-pay | Admitting: *Deleted

## 2013-07-10 NOTE — Telephone Encounter (Signed)
Dr Berline Chough microalbumin was done waiting on the results. Aj Crunkleton, Virgel Bouquet

## 2013-07-10 NOTE — Telephone Encounter (Signed)
Message copied by Tanna Savoy on Tue Jul 10, 2013  8:54 AM ------      Message from: Gaspar Bidding D      Created: Mon Jul 09, 2013  6:11 PM       Did we get the microalbumin? ------

## 2013-07-16 NOTE — Progress Notes (Signed)
Normal microalbumin - no ACE indicated

## 2013-07-18 ENCOUNTER — Telehealth: Payer: Self-pay | Admitting: Sports Medicine

## 2013-07-18 ENCOUNTER — Ambulatory Visit (AMBULATORY_SURGERY_CENTER): Payer: Self-pay | Admitting: *Deleted

## 2013-07-18 VITALS — Ht 63.0 in | Wt 148.6 lb

## 2013-07-18 DIAGNOSIS — Z1211 Encounter for screening for malignant neoplasm of colon: Secondary | ICD-10-CM

## 2013-07-18 MED ORDER — MOVIPREP 100 G PO SOLR: ORAL | Status: DC

## 2013-07-18 MED ORDER — AZITHROMYCIN 250 MG PO TABS: ORAL_TABLET | ORAL | Status: DC

## 2013-07-18 NOTE — Progress Notes (Signed)
No allergies to eggs or soy. Nausea after anesthesia.

## 2013-07-18 NOTE — Telephone Encounter (Signed)
Spoke with patient and informed her of below 

## 2013-07-18 NOTE — Telephone Encounter (Signed)
Called in Z-pac per pt request; has been >10 days. Pt will need further follow up if not improving.

## 2013-07-18 NOTE — Telephone Encounter (Signed)
Was given flonase for allergies but has symptons of sinus infection. settleing in chest with a cough.  Can  Dr Berline Chough call in a z-pack?

## 2013-07-19 ENCOUNTER — Encounter: Payer: Self-pay | Admitting: Internal Medicine

## 2013-07-19 ENCOUNTER — Encounter: Payer: Self-pay | Admitting: Gastroenterology

## 2013-07-24 ENCOUNTER — Encounter: Payer: Self-pay | Admitting: Internal Medicine

## 2013-07-30 ENCOUNTER — Encounter: Payer: Self-pay | Admitting: Internal Medicine

## 2013-08-01 ENCOUNTER — Ambulatory Visit (AMBULATORY_SURGERY_CENTER): Payer: BC Managed Care – PPO | Admitting: Internal Medicine

## 2013-08-01 ENCOUNTER — Encounter: Payer: Self-pay | Admitting: Internal Medicine

## 2013-08-01 ENCOUNTER — Encounter: Payer: BC Managed Care – PPO | Admitting: Internal Medicine

## 2013-08-01 VITALS — BP 133/76 | HR 60 | Temp 97.2°F | Resp 12 | Ht 63.0 in | Wt 148.0 lb

## 2013-08-01 DIAGNOSIS — Z1211 Encounter for screening for malignant neoplasm of colon: Secondary | ICD-10-CM

## 2013-08-01 LAB — GLUCOSE, CAPILLARY

## 2013-08-01 MED ORDER — SODIUM CHLORIDE 0.9 % IV SOLN: 500.0000 mL | INTRAVENOUS | Status: DC

## 2013-08-01 NOTE — Op Note (Signed)
[facility] [address] [place], [address]   COLONOSCOPY PROCEDURE REPORT  PATIENT: Wendy Mcclain, Wendy Mcclain  MR#: [id number] BIRTHDATE: [birth year] , 62  yrs. old GENDER: Female ENDOSCOPIST: Roxy Cedar, MD REFERRED [id number] Recall PROCEDURE DATE:  08/01/2013 PROCEDURE:   Colonoscopy, screening First Screening Colonoscopy - Avg.  risk and is 50 yrs.  old or older - No.  Prior Negative Screening - Now for repeat screening. 10 or more years since last screening  History of Adenoma - Now for follow-up colonoscopy & has been > or = to 3 yrs.  N/A  Polyps Removed Today? No.  Recommend repeat exam, <10 yrs? No. ASA CLASS:   Class II INDICATIONS:average risk screening. MEDICATIONS: MAC sedation, administered by CRNA and propofol (Diprivan) 200mg  IV  DESCRIPTION OF PROCEDURE:   After the risks benefits and alternatives of the procedure were thoroughly explained, informed consent was obtained.  A digital rectal exam revealed no abnormalities of the rectum.   The LB [id number]  endoscope was introduced through the anus and advanced to the cecum, which was identified by both the appendix and ileocecal valve. No adverse events experienced.   The quality of the prep was good, using MoviPrep  The instrument was then slowly withdrawn as the colon was fully examined.      COLON FINDINGS: Moderate diverticulosis was noted The finding was in the right colon and The finding was left colon.   The colon mucosa was otherwise normal.  Retroflexed views revealed internal hemorrhoids. The time to cecum=3 minutes 28 seconds.  Withdrawal time=10 minutes 45 seconds.  The scope was withdrawn and the procedure completed.  COMPLICATIONS: There were no complications.  ENDOSCOPIC IMPRESSION: 1.   Moderate diverticulosis was noted in the right colon and left colon 2.   The colon mucosa was otherwise normal  RECOMMENDATIONS: 1. Continue current colorectal screening  recommendations for "routine risk" patients with a repeat colonoscopy in 10 years.   eSigned:  Roxy Cedar, MD 08/01/2013 10:10 AM   cc: The Patient    Flo Shanks, DO [facility])

## 2013-08-01 NOTE — Patient Instructions (Signed)

## 2013-08-01 NOTE — Progress Notes (Signed)
Patient did not experience any of the following events: a burn prior to discharge; a fall within the facility; wrong site/side/patient/procedure/implant event; or a hospital transfer or hospital admission upon discharge from the facility. (G8907)  

## 2013-08-03 ENCOUNTER — Telehealth: Payer: Self-pay

## 2013-08-03 NOTE — Telephone Encounter (Signed)
  Follow up Call-  Call back number 08/01/2013  Post procedure Call Back phone  # [phone]  Permission to leave phone message Yes     Patient questions:  Do you have a fever, pain , or abdominal swelling? no Pain Score  0 *  Have you tolerated food without any problems? yes  Have you been able to return to your normal activities? yes  Do you have any questions about your discharge instructions: Diet   no Medications  no Follow up visit  no  Do you have questions or concerns about your Care? no  Actions: * If pain score is 4 or above: No action needed, pain <4.

## 2013-08-13 ENCOUNTER — Encounter: Payer: Self-pay | Admitting: Internal Medicine

## 2013-11-02 NOTE — Telephone Encounter (Signed)
Procedure done/yf

## 2014-02-13 ENCOUNTER — Ambulatory Visit (INDEPENDENT_AMBULATORY_CARE_PROVIDER_SITE_OTHER): Admitting: Emergency Medicine

## 2014-02-13 ENCOUNTER — Ambulatory Visit (INDEPENDENT_AMBULATORY_CARE_PROVIDER_SITE_OTHER)

## 2014-02-13 VITALS — BP 140/88 | HR 86 | Temp 97.4°F | Resp 16 | Ht 62.5 in | Wt 143.8 lb

## 2014-02-13 DIAGNOSIS — R079 Chest pain, unspecified: Secondary | ICD-10-CM

## 2014-02-13 DIAGNOSIS — R05 Cough: Secondary | ICD-10-CM

## 2014-02-13 DIAGNOSIS — R059 Cough, unspecified: Secondary | ICD-10-CM

## 2014-02-13 DIAGNOSIS — S46819A Strain of other muscles, fascia and tendons at shoulder and upper arm level, unspecified arm, initial encounter: Secondary | ICD-10-CM

## 2014-02-13 DIAGNOSIS — S43499A Other sprain of unspecified shoulder joint, initial encounter: Secondary | ICD-10-CM

## 2014-02-13 MED ORDER — NAPROXEN SODIUM 550 MG PO TABS: 550.0000 mg | ORAL_TABLET | Freq: Two times a day (BID) | ORAL | Status: AC

## 2014-02-13 MED ORDER — CYCLOBENZAPRINE HCL 10 MG PO TABS: 10.0000 mg | ORAL_TABLET | Freq: Three times a day (TID) | ORAL | Status: DC | PRN

## 2014-02-13 NOTE — Progress Notes (Signed)
[facility] [address], [place] [address] [phone]  Date:  02/13/2014   Name:  Wendy Mcclain   DOB:  Apr 11, 1951   MRN:  [id number]  PCP:  Janora Norlander, DO    Chief Complaint: Chest Pain   History of Present Illness:  Wendy Mcclain is a 63 y.o. very pleasant female patient who presents with the following:  Patient comes in complaining of pain across upper back intermittently over the past month.  She has an intermittent non productive cough. No wheezing or shortness of breath.  No nausea or vomiting.  No stool change.  Her pain is not pleuritic and lasts only a short time of 20 minutes and eases.  Not associated with diet or activity.  No fever or chills.  No stool change.  No rash.  Has NIDDM and borderline blood pressure elevation not on medication.  Has some nasal congestion.  No drainage or post nasal drip.  No sore throat.  No history of back injury or overuse.  Has been on vacation. No improvement with over the counter medications or other home remedies. jd   Patient Active Problem List   Diagnosis Date Noted  . Allergic rhinitis 07/09/2013  . Sleep disorder 07/09/2013  . Elevated blood pressure (not hypertension) 07/09/2013  . Mood disorder 11/23/2011  . DM (diabetes mellitus), type 2, uncontrolled 11/23/2011  . Health care maintenance 11/23/2011    Past Medical History  Diagnosis Date  . Diabetes mellitus   . Arrhythmia   . Asthma   . GERD (gastroesophageal reflux disease)     Past Surgical History  Procedure Laterality Date  . Appendectomy  1963  . Abdominal hysterectomy  1993  . Breast enhancement surgery  1986    History  Substance Use Topics  . Smoking status: Never Smoker   . Smokeless tobacco: Never Used  . Alcohol Use: No    Family History  Problem Relation Age of Onset  . Aortic aneurysm Paternal Aunt     2 aunts died at age 26  . Alzheimer's disease Mother   . Cancer Father     esophageal  . Cancer Sister      stomach  . Cancer Paternal Grandfather     mouth & larynx  . Cancer Paternal Aunt     colon  . Heart disease    . Diabetes    . Hyperlipidemia    . Hypertension    . Stroke    . Thyroid disease Sister   . Colon cancer Neg Hx     Allergies  Allergen Reactions  . Metformin And Related Diarrhea    Medication list has been reviewed and updated.  Current Outpatient Prescriptions on File Prior to Visit  Medication Sig Dispense Refill  . azithromycin (ZITHROMAX) 250 MG tablet Take 2 tablets today, then 1 tablet daily  6 tablet  0  . diphenhydrAMINE (SOMINEX) 25 MG tablet Take 1 tablet (25 mg total) by mouth at bedtime as needed for sleep.  30 tablet  0  . fluticasone (FLONASE) 50 MCG/ACT nasal spray Place 2 sprays into both nostrils daily.  16 g  6  . glipiZIDE (GLUCOTROL) 10 MG tablet TAKE 1 TABLET BY MOUTH TWICE DAILY BEFORE A MEAL  60 tablet  0  . hydrochlorothiazide (HYDRODIURIL) 12.5 MG tablet Take 1 tablet (12.5 mg total) by mouth daily.  30 tablet  2  . insulin glargine (LANTUS) 100 UNIT/ML injection Inject 0.32 mLs (  32 Units total) into the skin every morning.  10 mL  12  . Multiple Vitamin (MULTIVITAMIN WITH MINERALS) TABS tablet Take 1 tablet by mouth daily.       No current facility-administered medications on file prior to visit.    Review of Systems:  As per HPI, otherwise negative.    Physical Examination: Filed Vitals:   02/13/14 2028  BP: 140/88  Pulse: 86  Temp: 97.4 F (36.3 C)  Resp: 16   Filed Vitals:   02/13/14 2028  Height: 5' 2.5" (1.588 m)  Weight: 143 lb 12.8 oz (65.227 kg)   Body mass index is 25.87 kg/(m^2). Ideal Body Weight: Weight in (lb) to have BMI = 25: 138.6  GEN: WDWN, NAD, Non-toxic, A & O x 3  Persistent cough HEENT: Atraumatic, Normocephalic. Neck supple. No masses, No LAD. Ears and Nose: No external deformity. CV: RRR, No M/G/R. No JVD. No thrill. No extra heart sounds. PULM: CTA B, no wheezes, crackles, rhonchi. No  retractions. No resp. distress. No accessory muscle use. ABD: S, NT, ND, +BS. No rebound. No HSM. EXTR: No c/c/e NEURO Normal gait.  PSYCH: Normally interactive. Conversant. Not depressed or anxious appearing.  Calm demeanor.  BACK:  Tenderness in trapezius muscles across upper back.   Assessment and Plan: Back pain Anaprox Flexeril Cough CXR  Signed,  Ellison Carwin, MD   [facility] reading (PRIMARY) by  Dr. Ouida Sills. negative.

## 2014-03-20 ENCOUNTER — Ambulatory Visit (INDEPENDENT_AMBULATORY_CARE_PROVIDER_SITE_OTHER): Admitting: Family Medicine

## 2014-03-20 ENCOUNTER — Encounter: Payer: Self-pay | Admitting: Family Medicine

## 2014-03-20 DIAGNOSIS — Z79899 Other long term (current) drug therapy: Secondary | ICD-10-CM

## 2014-03-20 DIAGNOSIS — IMO0002 Reserved for concepts with insufficient information to code with codable children: Secondary | ICD-10-CM

## 2014-03-20 DIAGNOSIS — B009 Herpesviral infection, unspecified: Secondary | ICD-10-CM

## 2014-03-20 DIAGNOSIS — Z0000 Encounter for general adult medical examination without abnormal findings: Secondary | ICD-10-CM

## 2014-03-20 DIAGNOSIS — Z1159 Encounter for screening for other viral diseases: Secondary | ICD-10-CM

## 2014-03-20 DIAGNOSIS — F39 Unspecified mood [affective] disorder: Secondary | ICD-10-CM

## 2014-03-20 DIAGNOSIS — IMO0001 Reserved for inherently not codable concepts without codable children: Secondary | ICD-10-CM

## 2014-03-20 DIAGNOSIS — J309 Allergic rhinitis, unspecified: Secondary | ICD-10-CM

## 2014-03-20 DIAGNOSIS — G479 Sleep disorder, unspecified: Secondary | ICD-10-CM

## 2014-03-20 DIAGNOSIS — E1165 Type 2 diabetes mellitus with hyperglycemia: Secondary | ICD-10-CM

## 2014-03-20 DIAGNOSIS — Z1321 Encounter for screening for nutritional disorder: Secondary | ICD-10-CM

## 2014-03-20 DIAGNOSIS — B001 Herpesviral vesicular dermatitis: Secondary | ICD-10-CM

## 2014-03-20 DIAGNOSIS — K219 Gastro-esophageal reflux disease without esophagitis: Secondary | ICD-10-CM | POA: Diagnosis not present

## 2014-03-20 DIAGNOSIS — R030 Elevated blood-pressure reading, without diagnosis of hypertension: Secondary | ICD-10-CM

## 2014-03-20 DIAGNOSIS — Z1329 Encounter for screening for other suspected endocrine disorder: Secondary | ICD-10-CM

## 2014-03-20 DIAGNOSIS — Z13228 Encounter for screening for other metabolic disorders: Secondary | ICD-10-CM

## 2014-03-20 DIAGNOSIS — Z130 Encounter for screening for diseases of the blood and blood-forming organs and certain disorders involving the immune mechanism: Secondary | ICD-10-CM

## 2014-03-20 DIAGNOSIS — Z114 Encounter for screening for human immunodeficiency virus [HIV]: Secondary | ICD-10-CM

## 2014-03-20 HISTORY — DX: Gastro-esophageal reflux disease without esophagitis: K21.9

## 2014-03-20 HISTORY — DX: Herpesviral vesicular dermatitis: B00.1

## 2014-03-20 LAB — POCT GLYCOSYLATED HEMOGLOBIN (HGB A1C): HEMOGLOBIN A1C: 7.6

## 2014-03-20 MED ORDER — ALBUTEROL SULFATE HFA 108 (90 BASE) MCG/ACT IN AERS: 2.0000 | INHALATION_SPRAY | Freq: Four times a day (QID) | RESPIRATORY_TRACT | Status: DC | PRN

## 2014-03-20 MED ORDER — OMEPRAZOLE 20 MG PO CPDR: 20.0000 mg | DELAYED_RELEASE_CAPSULE | Freq: Every day | ORAL | Status: DC

## 2014-03-20 MED ORDER — FLUTICASONE PROPIONATE 50 MCG/ACT NA SUSP: 2.0000 | Freq: Every day | NASAL | Status: DC

## 2014-03-20 NOTE — Assessment & Plan Note (Addendum)
HgbA1c: 7.6 today.  CBG avg 122.   -Patient to continue current regimen of Lantus 20u with Invokana 300mg  qd as directed. -Patient to f/u with Dr Buddy Duty (endocrinologist) in October -Continue to monitor CBGs daily.  -Patient denies any hypoglycemic episodes but has been counseled on what to do with low CBGs -lipid panel

## 2014-03-20 NOTE — Assessment & Plan Note (Addendum)
Stable. -Well controlled with Sominex PRN (diphenhydramine)

## 2014-03-20 NOTE — Patient Instructions (Signed)
It was such a pleasure meeting you today, Wendy Mcclain!  Please make sure to schedule an appointment with the lab so that you can get your FASTING labs done.  I will contact you with these results.  Plan to followup with Dr Buddy Duty regarding your diabetes medications.  Plan to schedule you Mammogram.  You should try Abreva or its generic for the blisters on your lip.  Cold Sore A cold sore (fever blister) is a skin infection caused by a certain type of germ (virus). They are small sores filled with fluid that dry up and heal within 2 weeks. Cold sores form inside of the mouth or on the lips, gums, and other parts of the body. Cold sores can be easily passed (contagious) to other people. This can happen through close personal contact, such as kissing or sharing a drinking glass. HOME CARE  Only take medicine as told by your doctor. Do not use aspirin.  Use a cotton-tip swab to put creams or gels on your sores.  Do not touch sores or pick scabs. Wash your hands often. Do not touch your eyes without washing your hands first.  Avoid kissing, oral sex, and sharing personal items until the sores heal.  Put an ice pack on your sores for 10-15 minutes to ease discomfort.  Avoid hot, cold, or salty foods. Eat a soft, bland diet. Use a straw to drink if it helps lessen pain.  Keep sores clean and dry.  Avoid the sun and limit stress if these things cause you to have sores. Apply sunscreen on your lips if the sun causes cold sores. GET HELP IF:  You have a fever or lasting symptoms for more than 2-3 days.  You have a fever and your symptoms suddenly get worse.  You have yellow-white fluid (not clear) coming from the sores.  You have redness that is spreading.  You have pain or irritation in your eye.  You get sores on your genitals.  Your sores do not heal within 2 weeks.  You have a tough time fighting off sickness and infections (weakened immune system).  You get cold sores often. MAKE SURE  YOU:   Understand these instructions.  Will watch your condition.  Will get help right away if you are not doing well or get worse. Document Released: 01/18/2012 Document Reviewed: 01/18/2012 [facility] Patient Information 2015 [place]. This information is not intended to replace advice given to you by your health care provider. Make sure you discuss any questions you have with your health care provider.

## 2014-03-20 NOTE — Assessment & Plan Note (Addendum)
Patient reports increased allergies- post nasal drip, non productive cough, wheeze.  Patient reports a history of allergy associated asthma. -Continue use of Flonase NS as directed -Claritin or Zyrtec qd -Albuterol HFA for allergy induced asthma -Patient to seek medical attention if she becomes febrile, develops uncontrolled SOB or if needs to use her inhaler more than 1-2x/week.

## 2014-03-20 NOTE — Assessment & Plan Note (Signed)
BP stable with no medications. -continue to monitor.

## 2014-03-20 NOTE — Assessment & Plan Note (Signed)
Patient to schedule annual mammogram, eye appointment.

## 2014-03-20 NOTE — Assessment & Plan Note (Addendum)
3 month h/o of fever blister on upper and lower lip -Apply abreva to affected area as directed. -Information given on fever blister and care -Will reassess in 2-3 weeks for resolution

## 2014-03-20 NOTE — Progress Notes (Signed)
Patient ID: Wendy Mcclain, female   DOB: February 28, 1951, 63 y.o.   MRN: [id number]      Subjective: CC: annual wellness exam HPI: Patient is a 63 y.o. female presenting to clinic today for annual physical. Concerns today include:  1. Fever blister: Patient states that she developed a fever blister on her upper and lower lip about 4-5 months ago.  She states that her sister had one and that she thinks that's where she got it.  She denies fever but does admit to intermittent postnasal drip and cough.  2. Acid reflux: Patient reports that she has acid reflux 3-4x weekly.  Relieved by OTC omeprazole.  Admits to belching and bloating.  Denies n/v. She is requesting a formal Rx for Omeprazole today.  3. Allergies Patient states that she has only been intermittently using her Flonase.  She reports that she has had an intermittent postnasal drip that causes her to cough at night.  Denies fevers, sinus pressure, headache.  Denies sick contacts (except for sister with fever blister).  4. T2DM: Patient is taking Invokana 300mg  in conjunction with 20u of Lantus.  She states that CBGs 118-130 in the am with an average 122.  Denies any hypoglycemic episodes since lowering Lantus to accommodate Invokana dose increase.  She also denies dysuria, polyuria (outside of normal), or hematuria.  She is managed by her endocrinologist Dr Buddy Duty.  Appt scheduled for October.  History Reviewed: non smoker. Health Maintenance: Mammogram to be scheduled, saw Dentist this year, to schedule Eye exam, Received Pneumoccocal vaccine at another facility last year.  Social Hx UTD see EMR SurgHx UTD see EMR Family Hx UTD see EMR  ROS: All other systems reviewed and are negative.  Objective: Office vital signs reviewed.  BP 129/79 HR 77 Wt 142 Temp 97.26F  Physical Exam  Constitutional: She is oriented to person, place, and time and well-developed, well-nourished, and in no distress. No distress.  HENT:  Head:  Normocephalic and atraumatic.  Nose: Nose normal.  Mouth/Throat: Oropharynx is clear and moist. No oropharyngeal exudate.  Small blister on lower lip consistent with herpes labialis lesion. Non exudative, non bleeding  Eyes: Conjunctivae and EOM are normal. Pupils are equal, round, and reactive to light. No scleral icterus.  Neck: Normal range of motion. Neck supple. No JVD present. No tracheal deviation present. No thyromegaly present.  Cardiovascular: Normal rate, regular rhythm, normal heart sounds and intact distal pulses.  Exam reveals no gallop and no friction rub.   No murmur heard. Pulmonary/Chest: Effort normal. No stridor. No respiratory distress. She has no wheezes. She has no rales. She exhibits no tenderness.  Abdominal: Soft. Bowel sounds are normal. She exhibits no distension and no mass. There is no tenderness. There is no rebound and no guarding.  Musculoskeletal: Normal range of motion. She exhibits no edema and no tenderness.  Lymphadenopathy:    She has no cervical adenopathy.  Neurological: She is alert and oriented to person, place, and time. She has normal reflexes. She displays normal reflexes. No cranial nerve deficit. She exhibits normal muscle tone. Gait normal. Coordination normal.  Skin: Skin is warm and dry. No rash noted. She is not diaphoretic. No erythema. No pallor.  1 round non exudative non bleeding lesion on upper and lower lip concordant with herpes labialis   Psychiatric: Mood, memory, affect and judgment normal.   Assessment: 63 y.o. female with: 1. Herpes labialis 2. Gastro-esophageal reflux 3. Asthma secondary to allergic rhinitis  4. T2DM  Plan: See Problem List and After Visit Summary   Janora Norlander, DO PGY-1, [place]

## 2014-03-20 NOTE — Assessment & Plan Note (Signed)
Experiencing heart burn 3-4 times weekly- relieved by OTC Omeprazole 20mg . -Continue to take Omeprazole 20mg  daily. -Will consider testing for h.pylori at a future visit.

## 2014-03-20 NOTE — Assessment & Plan Note (Signed)
Patient is stable. -Admits to intermittent depression regarding her husband's passing.  However, is otherwise doing well. -Patient reassured and informed of various resources available at [facility].   -Patient will let me know if she feels like she needs counseling or medication. -Continue to monitor going forward.

## 2014-03-21 MED ORDER — FLUTICASONE PROPIONATE 50 MCG/ACT NA SUSP: 2.0000 | Freq: Every day | NASAL | Status: DC

## 2014-03-22 NOTE — Progress Notes (Signed)
Reviewed

## 2014-03-26 ENCOUNTER — Ambulatory Visit (INDEPENDENT_AMBULATORY_CARE_PROVIDER_SITE_OTHER): Admitting: Internal Medicine

## 2014-03-26 ENCOUNTER — Other Ambulatory Visit

## 2014-03-26 VITALS — BP 120/78 | HR 69 | Temp 97.4°F | Resp 16 | Ht 63.0 in | Wt 141.4 lb

## 2014-03-26 DIAGNOSIS — Z114 Encounter for screening for human immunodeficiency virus [HIV]: Secondary | ICD-10-CM

## 2014-03-26 DIAGNOSIS — J020 Streptococcal pharyngitis: Secondary | ICD-10-CM

## 2014-03-26 DIAGNOSIS — Z1321 Encounter for screening for nutritional disorder: Secondary | ICD-10-CM

## 2014-03-26 DIAGNOSIS — R059 Cough, unspecified: Secondary | ICD-10-CM

## 2014-03-26 DIAGNOSIS — R05 Cough: Secondary | ICD-10-CM

## 2014-03-26 DIAGNOSIS — Z79899 Other long term (current) drug therapy: Secondary | ICD-10-CM

## 2014-03-26 DIAGNOSIS — E1165 Type 2 diabetes mellitus with hyperglycemia: Secondary | ICD-10-CM

## 2014-03-26 DIAGNOSIS — IMO0002 Reserved for concepts with insufficient information to code with codable children: Secondary | ICD-10-CM

## 2014-03-26 DIAGNOSIS — Z1329 Encounter for screening for other suspected endocrine disorder: Secondary | ICD-10-CM

## 2014-03-26 LAB — CBC WITH DIFFERENTIAL/PLATELET
BASOS PCT: 1 % (ref 0–1)
Basophils Absolute: 0.1 10*3/uL (ref 0.0–0.1)
EOS ABS: 0.2 10*3/uL (ref 0.0–0.7)
Eosinophils Relative: 4 % (ref 0–5)
HCT: 40.8 % (ref 36.0–46.0)
Hemoglobin: 13.8 g/dL (ref 12.0–15.0)
Lymphocytes Relative: 42 % (ref 12–46)
Lymphs Abs: 2.1 10*3/uL (ref 0.7–4.0)
MCH: 30.1 pg (ref 26.0–34.0)
MCHC: 33.8 g/dL (ref 30.0–36.0)
MCV: 88.9 fL (ref 78.0–100.0)
MONOS PCT: 9 % (ref 3–12)
Monocytes Absolute: 0.5 10*3/uL (ref 0.1–1.0)
NEUTROS PCT: 44 % (ref 43–77)
Neutro Abs: 2.2 10*3/uL (ref 1.7–7.7)
PLATELETS: 268 10*3/uL (ref 150–400)
RBC: 4.59 MIL/uL (ref 3.87–5.11)
RDW: 13.7 % (ref 11.5–15.5)
WBC: 5.1 10*3/uL (ref 4.0–10.5)

## 2014-03-26 LAB — POCT RAPID STREP A (OFFICE): RAPID STREP A SCREEN: NEGATIVE

## 2014-03-26 MED ORDER — HYDROCODONE-HOMATROPINE 5-1.5 MG/5ML PO SYRP: 5.0000 mL | ORAL_SOLUTION | Freq: Four times a day (QID) | ORAL | Status: DC | PRN

## 2014-03-26 MED ORDER — LIDOCAINE VISCOUS 2 % MT SOLN: OROMUCOSAL | Status: DC

## 2014-03-26 NOTE — Progress Notes (Signed)
CBC WITH DIFF,FLP,TSH,VIT D AND HIV DONE TODAY Wendy Mcclain

## 2014-03-26 NOTE — Progress Notes (Signed)
   Subjective:    Patient ID: Wendy Mcclain, female    DOB: April 24, 1951, 63 y.o.   MRN: [id number]  This chart was scribed for Wendy Lin, MD by Erling Conte, Medical Scribe. This patient was seen in Room 4 and the patient's care was started at 8:10 PM.  Chief Complaint  Patient presents with  . Sore Throat    Strep Exposure     HPI HPI Comments: [name] is a 63 y.o. female who presents to the [facility] complaining of a constant, moderate, sore throat for 4 days. She states she has had some exposure to step throat. She admits that her grandchildren are both sick with strep and she has been around them. She states that's she has had an associated intermittent cough. She has not taken anything for the symptoms. She denies any fever, chills, otalgia, rhinorrhea, nausea, emesis, HA, or abdominal pain.    Review of Systems  Constitutional: Negative for fever and chills.  HENT: Positive for sore throat. Negative for ear pain and rhinorrhea.   Respiratory: Positive for cough.   Gastrointestinal: Negative for nausea, vomiting and abdominal pain.  Neurological: Negative for headaches.  All other systems reviewed and are negative.      Objective:   Physical Exam Physical Exam  Nursing note and vitals reviewed. Constitutional: She is oriented to person, place, and time. She appears well-developed and well-nourished. No distress.  HENT:  Head: Normocephalic and atraumatic.  Eyes: Conjunctivae and EOM are normal.  Ears: Right and Left TM's clear.  Throat: Posterior oropharyngeal erythema without exudate. Nose: Normal and clear Neck: Neck supple.  Cardiovascular: Normal rate.   Pulmonary/Chest: Effort normal. No respiratory distress. CTA. No wheezes. Musculoskeletal: Normal range of motion.  Neurological: She is alert and oriented to person, place, and time.  Skin: Skin is warm and dry.  Psychiatric: She has a normal mood and affect. Her behavior is  normal.  Filed Vitals:   03/26/14 2005  BP: 120/78  Pulse: 69  Temp: 97.4 F (36.3 C)  TempSrc: Oral  Resp: 16  Height: 5\' 3"  (1.6 m)  Weight: 141 lb 6 oz (64.127 kg)  SpO2: 98%        Assessment & Plan:  Streptococcal sore throat - Plan: POCT rapid strep A, Culture, Group A Strep  Cough  Meds ordered this encounter  Medications  . HYDROcodone-homatropine (HYCODAN) 5-1.5 MG/5ML syrup    Sig: Take 5 mLs by mouth every 6 (six) hours as needed.    Dispense:  120 mL    Refill:  0  . lidocaine (XYLOCAINE) 2 % solution    Sig: Use 1 teaspoon every 2 hours to swish and swallow or spit as needed for pain    Dispense:  60 mL    Refill:  0   Throat culture sent

## 2014-03-27 ENCOUNTER — Encounter: Payer: Self-pay | Admitting: Family Medicine

## 2014-03-27 LAB — VITAMIN D 25 HYDROXY (VIT D DEFICIENCY, FRACTURES): Vit D, 25-Hydroxy: 30 ng/mL (ref 30–89)

## 2014-03-27 LAB — LIPID PANEL
Cholesterol: 186 mg/dL (ref 0–200)
HDL: 53 mg/dL (ref >39)
LDL CALC: 108 mg/dL — AB (ref 0–99)
Total CHOL/HDL Ratio: 3.5 Ratio
Triglycerides: 127 mg/dL (ref <150)
VLDL: 25 mg/dL (ref 0–40)

## 2014-03-27 LAB — HIV ANTIBODY (ROUTINE TESTING W REFLEX): HIV 1&2 Ab, 4th Generation: NONREACTIVE

## 2014-03-27 LAB — TSH: TSH: 4.077 u[IU]/mL (ref 0.350–4.500)

## 2014-03-29 LAB — CULTURE, GROUP A STREP: ORGANISM ID, BACTERIA: NORMAL

## 2014-04-03 ENCOUNTER — Telehealth: Payer: Self-pay | Admitting: Radiology

## 2014-04-03 ENCOUNTER — Encounter: Payer: Self-pay | Admitting: Internal Medicine

## 2014-04-03 NOTE — Telephone Encounter (Signed)
Pt calling about lab results  

## 2014-04-03 NOTE — Telephone Encounter (Signed)
Labs wnl and should have been mailed already

## 2014-04-03 NOTE — Telephone Encounter (Signed)
Spoke to pt- advised lab results and letter mailed.

## 2014-04-05 ENCOUNTER — Telehealth: Payer: Self-pay

## 2014-04-05 NOTE — Telephone Encounter (Signed)
Ok to call in trans derm scop patch

## 2014-04-05 NOTE — Telephone Encounter (Signed)
Patient requesting a prescription for motion sickness (patches). Per patient she is leaving late today to go to the beach and will be on a boat. Patient requesting if possible to have it called in to [facility] in [place]. Patients call back number is [phone]

## 2014-04-08 MED ORDER — SCOPOLAMINE 1 MG/3DAYS TD PT72: 1.0000 | MEDICATED_PATCH | TRANSDERMAL | Status: DC

## 2014-04-08 NOTE — Telephone Encounter (Signed)
Sent in Rx and notified pt that it is at her local pharm if she wants to have [place] the beach. Pt thanked [place].

## 2014-05-20 ENCOUNTER — Encounter: Payer: Self-pay | Admitting: Family Medicine

## 2014-05-20 ENCOUNTER — Ambulatory Visit (INDEPENDENT_AMBULATORY_CARE_PROVIDER_SITE_OTHER): Admitting: Family Medicine

## 2014-05-20 ENCOUNTER — Other Ambulatory Visit: Payer: Self-pay | Admitting: Family Medicine

## 2014-05-20 VITALS — BP 119/54 | HR 75 | Temp 98.3°F | Ht 63.0 in | Wt 141.1 lb

## 2014-05-20 DIAGNOSIS — B001 Herpesviral vesicular dermatitis: Secondary | ICD-10-CM

## 2014-05-20 DIAGNOSIS — K219 Gastro-esophageal reflux disease without esophagitis: Secondary | ICD-10-CM

## 2014-05-20 DIAGNOSIS — E559 Vitamin D deficiency, unspecified: Secondary | ICD-10-CM

## 2014-05-20 DIAGNOSIS — D692 Other nonthrombocytopenic purpura: Secondary | ICD-10-CM

## 2014-05-20 HISTORY — DX: Vitamin D deficiency, unspecified: E55.9

## 2014-05-20 MED ORDER — VALACYCLOVIR HCL 1 G PO TABS: ORAL_TABLET | ORAL | Status: DC

## 2014-05-20 MED ORDER — OMEPRAZOLE 20 MG PO CPDR: 20.0000 mg | DELAYED_RELEASE_CAPSULE | Freq: Every day | ORAL | Status: DC

## 2014-05-20 MED ORDER — ERGOCALCIFEROL 1.25 MG (50000 UT) PO CAPS: 50000.0000 [IU] | ORAL_CAPSULE | ORAL | Status: DC

## 2014-05-20 NOTE — Progress Notes (Signed)
Patient ID: Wendy Mcclain, female   DOB: 08/27/1950, 63 y.o.   MRN: [id number]    Subjective: CC:f/u vitamin d and lip lesions HPI: Patient is a 63 y.o. female presenting to clinic today for followup. Concerns today include:  1. Vitamin D Level was 30 (low end of normal).  Patient reports continued decreased energy.  Admits to adequate calcium intake but unsure of Vitamin D intake. She reports poor physical activity, time spent out doors. Denies falls.  2. Herpes Labialis Patient reports that Abreva has helped minimally with cold sores.  She denies any new ones "popping up".  She denies fevers, chills, sick contacts but does admit to increased life stressors.  3. Lesion on R forearm Patient's daughter noticed a lesion on her forearm a few weeks ago.  She states that lesion is usually brown but occassionally turns red when she bumps it.  She is unsure as to how long the lesion has been there.  She states that she also has no idea if it has changed in shape or size.  Denies excessive bruising or bleeding, pain or exudate from lesion.  She does report daily use of Asprin.  History Reviewed: non smoker.  ROS: All other systems reviewed and are negative.  Objective: Office vital signs reviewed. BP 119/54  Pulse 75  Temp(Src) 98.3 F (36.8 C) (Oral)  Ht 5\' 3"  (1.6 m)  Wt 141 lb 1.6 oz (64.003 kg)  BMI 25.00 kg/m2  Physical Examination:  General: Awake, alert, well nourished, pale female, NAD HEENT: Atraumatic, normocephalic MSK: Normal gait and station Skin: dry, intact, no rashes  Lips: small, mildly erythematous lesions on her upper and lower lips.  Non exudative, non bleeding.  Appears smaller in size compared to previous exam.  Right forearm: 1.2cm x 0.75cm heart shaped lesion with a partially brown and a partially non blanching erythematous base. Non bleeding, non scaly, non ulcerative, non exudative.    Assessment/ Plan: See Problem List and After Visit Summary  63 y.o.  female with 1. Borderline vitamin d deficiency 2. Herpes labialis 3. Senile purpura: Benign.  Will continue to monitor for red flag changes going forward.     Janora Norlander, DO PGY-1, [place]

## 2014-05-20 NOTE — Patient Instructions (Signed)
It was a pleasure seeing you today!  Information regarding what we discussed is included in this packet.  Please feel free to call our office if any questions or concerns arise.  Plan to follow up with me after the new year. Ashly M. Gottschalk, DO  Vitamin D Deficiency  Not having enough vitamin D is called a deficiency. Your body needs this vitamin to keep your bones strong and healthy. Having too little of it can make your bones soft or can cause other health problems.  HOME CARE  Take all vitamins, herbs, or nutrition drinks (supplements) as told by your doctor.  Have your blood tested 2 months after taking vitamins, herbs, or nutrition drinks.  Eat foods that have vitamin D. This includes:  Dairy products, cereals, or juices with added vitamin D. Check the label.  Fatty fish like salmon or trout.  Eggs.  Oysters.  Do not use tanning beds.  Stay at a healthy weight. Lose weight if needed.  Keep all doctor visits as told. GET HELP IF:  You have questions.  You continue to have problems.  You feel sick to your stomach (nauseous) or throw up (vomit).  You cannot go poop (constipated).  You feel confused.  You have severe belly (abdominal) or back pain. MAKE SURE YOU:  Understand these instructions.  Will watch your condition.  Will get help right away if you are not doing well or get worse. Document Released: 07/08/2011 Document Revised: 11/13/2012 Document Reviewed: 07/08/2011 [facility] Patient Information 2015 [place], [place]. This information is not intended to replace advice given to you by your health care provider. Make sure you discuss any questions you have with your health care provider.

## 2014-05-20 NOTE — Assessment & Plan Note (Signed)
Abreva has mildly improved lesions on lips.   -Valtrex 2g q12 x1 day sent in -Will consider prophylactic treatment if warranted at next visit. -Patient to follow up with me after the new year.

## 2014-05-20 NOTE — Assessment & Plan Note (Addendum)
Vitamin D level low end of normal (30).  Because patient is post-menopausal, has reported chronic low energy, and has limited exposure to sun because of risk of sunburn, supplementation is merited.  -Vit D 50,000u q week x8 weeks -Will reassess levels after the new year (1 month after completion of therapy).

## 2014-07-18 ENCOUNTER — Other Ambulatory Visit: Payer: Self-pay | Admitting: Family Medicine

## 2014-07-29 ENCOUNTER — Other Ambulatory Visit: Payer: Self-pay | Admitting: Family Medicine

## 2014-07-29 NOTE — Telephone Encounter (Signed)
Pt called and would like a refill on her Valtrex called in. jw °

## 2014-07-30 MED ORDER — VALACYCLOVIR HCL 1 G PO TABS: ORAL_TABLET | ORAL | Status: DC

## 2014-07-30 NOTE — Telephone Encounter (Signed)
Spoke with patient and informed her of rx sent in

## 2014-08-19 ENCOUNTER — Encounter: Payer: Self-pay | Admitting: Family Medicine

## 2014-08-19 ENCOUNTER — Ambulatory Visit (INDEPENDENT_AMBULATORY_CARE_PROVIDER_SITE_OTHER): Admitting: Family Medicine

## 2014-08-19 VITALS — BP 131/76 | HR 88 | Temp 98.1°F | Ht 63.0 in | Wt 140.8 lb

## 2014-08-19 DIAGNOSIS — J019 Acute sinusitis, unspecified: Secondary | ICD-10-CM | POA: Insufficient documentation

## 2014-08-19 DIAGNOSIS — B379 Candidiasis, unspecified: Secondary | ICD-10-CM | POA: Insufficient documentation

## 2014-08-19 DIAGNOSIS — J0100 Acute maxillary sinusitis, unspecified: Secondary | ICD-10-CM

## 2014-08-19 HISTORY — DX: Acute sinusitis, unspecified: J01.90

## 2014-08-19 MED ORDER — AMOXICILLIN-POT CLAVULANATE 875-125 MG PO TABS: 1.0000 | ORAL_TABLET | Freq: Two times a day (BID) | ORAL | Status: DC

## 2014-08-19 MED ORDER — FLUCONAZOLE 150 MG PO TABS: 150.0000 mg | ORAL_TABLET | Freq: Once | ORAL | Status: DC

## 2014-08-19 NOTE — Patient Instructions (Signed)
It was nice to see you today.  Take the antibiotic as prescribed.  Sinusitis Sinusitis is redness, soreness, and inflammation of the paranasal sinuses. Paranasal sinuses are air pockets within the bones of your face (beneath the eyes, the middle of the forehead, or above the eyes). In healthy paranasal sinuses, mucus is able to drain out, and air is able to circulate through them by way of your nose. However, when your paranasal sinuses are inflamed, mucus and air can become trapped. This can allow bacteria and other germs to grow and cause infection. Sinusitis can develop quickly and last only a short time (acute) or continue over a long period (chronic). Sinusitis that lasts for more than 12 weeks is considered chronic.  CAUSES  Causes of sinusitis include:  Allergies.  Structural abnormalities, such as displacement of the cartilage that separates your nostrils (deviated septum), which can decrease the air flow through your nose and sinuses and affect sinus drainage.  Functional abnormalities, such as when the small hairs (cilia) that line your sinuses and help remove mucus do not work properly or are not present. SIGNS AND SYMPTOMS  Symptoms of acute and chronic sinusitis are the same. The primary symptoms are pain and pressure around the affected sinuses. Other symptoms include:  Upper toothache.  Earache.  Headache.  Bad breath.  Decreased sense of smell and taste.  A cough, which worsens when you are lying flat.  Fatigue.  Fever.  Thick drainage from your nose, which often is green and may contain pus (purulent).  Swelling and warmth over the affected sinuses. DIAGNOSIS  Your health care provider will perform a physical exam. During the exam, your health care provider may:  Look in your nose for signs of abnormal growths in your nostrils (nasal polyps).  Tap over the affected sinus to check for signs of infection.  View the inside of your sinuses (endoscopy) using an  imaging device that has a light attached (endoscope). If your health care provider suspects that you have chronic sinusitis, one or more of the following tests may be recommended:  Allergy tests.  Nasal culture. A sample of mucus is taken from your nose, sent to a lab, and screened for bacteria.  Nasal cytology. A sample of mucus is taken from your nose and examined by your health care provider to determine if your sinusitis is related to an allergy. TREATMENT  Most cases of acute sinusitis are related to a viral infection and will resolve on their own within 10 days. Sometimes medicines are prescribed to help relieve symptoms (pain medicine, decongestants, nasal steroid sprays, or saline sprays).  However, for sinusitis related to a bacterial infection, your health care provider will prescribe antibiotic medicines. These are medicines that will help kill the bacteria causing the infection.  Rarely, sinusitis is caused by a fungal infection. In theses cases, your health care provider will prescribe antifungal medicine. For some cases of chronic sinusitis, surgery is needed. Generally, these are cases in which sinusitis recurs more than 3 times per year, despite other treatments. HOME CARE INSTRUCTIONS   Drink plenty of water. Water helps thin the mucus so your sinuses can drain more easily.  Use a humidifier.  Inhale steam 3 to 4 times a day (for example, sit in the bathroom with the shower running).  Apply a warm, moist washcloth to your face 3 to 4 times a day, or as directed by your health care provider.  Use saline nasal sprays to help moisten and clean  your sinuses.  Take medicines only as directed by your health care provider.  If you were prescribed either an antibiotic or antifungal medicine, finish it all even if you start to feel better. SEEK IMMEDIATE MEDICAL CARE IF:  You have increasing pain or severe headaches.  You have nausea, vomiting, or drowsiness.  You have  swelling around your face.  You have vision problems.  You have a stiff neck.  You have difficulty breathing. MAKE SURE YOU:   Understand these instructions.  Will watch your condition.  Will get help right away if you are not doing well or get worse. Document Released: 07/19/2005 Document Revised: 12/03/2013 Document Reviewed: 08/03/2011 [facility] Patient Information 2015 [place], [place]. This information is not intended to replace advice given to you by your health care provider. Make sure you discuss any questions you have with your health care provider.

## 2014-08-19 NOTE — Assessment & Plan Note (Signed)
Given history of frequent infections and Invokana use will treat empirically with Diflucan.

## 2014-08-19 NOTE — Progress Notes (Signed)
   Subjective:    Patient ID: Wendy Mcclain, female    DOB: [birth year], 64 y.o.   MRN: [id number]  HPI 64 year old female with DM 2 presents for same day appointment with several complaints.  Patient reports a six-day history of sinus congestion, earache, and sore throat. Patient reports that she has had intermittent fever as well (Tmax 101).  Patient reports that she has had yellow nasal drainage. She also reports sinus tenderness.  She has tried over-the-counter sinus medications as well as Flonase without significant relief.  No current fever or chills. No shortness of breath.  She has had some cough.    Additionally, patient states she is on Invokana and currently has a yeast infection (she has had several of these in the past due to the medication).   Review of Systems Per HPI    Objective:   Physical Exam Filed Vitals:   08/19/14 1556  BP: 131/76  Pulse: 88  Temp: 98.1 F (36.7 C)   Exam: General: well appearing female in no acute distress. HEENT: NCAT. Normal TM's bilaterally. Oropharynx clear. Severe maxillary sinus tenderness to palpation. Cardiovascular: RRR. No murmurs, rubs, or gallops. Respiratory: CTAB. No rales, rhonchi, or wheeze. Abdomen: soft, nontender, nondistended.    Assessment & Plan:  See Problem List

## 2014-08-19 NOTE — Assessment & Plan Note (Signed)
History and physical exam consistent with acute sinusitis. Will treat with Augmentin 10 days.

## 2014-09-06 DIAGNOSIS — G5601 Carpal tunnel syndrome, right upper limb: Secondary | ICD-10-CM | POA: Diagnosis not present

## 2014-09-06 DIAGNOSIS — M25511 Pain in right shoulder: Secondary | ICD-10-CM | POA: Diagnosis not present

## 2014-09-06 DIAGNOSIS — M542 Cervicalgia: Secondary | ICD-10-CM | POA: Diagnosis not present

## 2014-09-06 DIAGNOSIS — G5602 Carpal tunnel syndrome, left upper limb: Secondary | ICD-10-CM | POA: Diagnosis not present

## 2014-09-09 DIAGNOSIS — E039 Hypothyroidism, unspecified: Secondary | ICD-10-CM | POA: Diagnosis not present

## 2014-09-09 DIAGNOSIS — M8580 Other specified disorders of bone density and structure, unspecified site: Secondary | ICD-10-CM | POA: Diagnosis not present

## 2014-09-09 DIAGNOSIS — L659 Nonscarring hair loss, unspecified: Secondary | ICD-10-CM | POA: Diagnosis not present

## 2014-09-09 DIAGNOSIS — E1142 Type 2 diabetes mellitus with diabetic polyneuropathy: Secondary | ICD-10-CM | POA: Diagnosis not present

## 2014-09-30 DIAGNOSIS — M542 Cervicalgia: Secondary | ICD-10-CM | POA: Diagnosis not present

## 2014-10-09 DIAGNOSIS — M542 Cervicalgia: Secondary | ICD-10-CM | POA: Diagnosis not present

## 2014-10-23 DIAGNOSIS — G5601 Carpal tunnel syndrome, right upper limb: Secondary | ICD-10-CM | POA: Diagnosis not present

## 2014-10-23 DIAGNOSIS — G5621 Lesion of ulnar nerve, right upper limb: Secondary | ICD-10-CM | POA: Diagnosis not present

## 2014-11-06 DIAGNOSIS — M542 Cervicalgia: Secondary | ICD-10-CM | POA: Diagnosis not present

## 2015-01-07 ENCOUNTER — Ambulatory Visit (INDEPENDENT_AMBULATORY_CARE_PROVIDER_SITE_OTHER): Payer: Medicare Other | Admitting: Family Medicine

## 2015-01-07 ENCOUNTER — Encounter: Payer: Self-pay | Admitting: Family Medicine

## 2015-01-07 ENCOUNTER — Ambulatory Visit: Admitting: Family Medicine

## 2015-01-07 VITALS — BP 120/75 | HR 81 | Temp 97.9°F | Ht 63.0 in | Wt 144.2 lb

## 2015-01-07 DIAGNOSIS — J0100 Acute maxillary sinusitis, unspecified: Secondary | ICD-10-CM | POA: Diagnosis present

## 2015-01-07 DIAGNOSIS — J309 Allergic rhinitis, unspecified: Secondary | ICD-10-CM | POA: Diagnosis not present

## 2015-01-07 MED ORDER — AMOXICILLIN-POT CLAVULANATE 875-125 MG PO TABS: 1.0000 | ORAL_TABLET | Freq: Two times a day (BID) | ORAL | Status: DC

## 2015-01-07 MED ORDER — FLUCONAZOLE 150 MG PO TABS: 150.0000 mg | ORAL_TABLET | Freq: Once | ORAL | Status: DC

## 2015-01-07 MED ORDER — FLUTICASONE PROPIONATE 50 MCG/ACT NA SUSP: 2.0000 | Freq: Every day | NASAL | Status: DC

## 2015-01-07 NOTE — Progress Notes (Signed)
Patient ID: Wendy Mcclain, female   DOB: 22-Jan-1951, 64 y.o.   MRN: [id number] Subjective:   CC: Dry cough, face pain  HPI:   Sameday visit for 2 weeks of sinus pain and pressure and dry cough, subjective fevers, 5/10 pain under eyes, occasional shortness of breath, "sticky eyes", and rhinorrhea. Normal PO intake and no abdominal pain, nausea, vomiting, or diarrhea. Feels like allergies but also with chest congestion. 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  Also concerned about bumps on her lips present 2 years since grandson was sick. They do not hurt or leak any pus but she has a hard time not touching/picking at them.   Review of Systems - Per HPI.   PMH - mood disorder, uncontrolled type 2 diabetes, allergic rhinitis, sleep disorder, elevated blood pressure, GERD, vitamin D deficiency    Objective:  Physical Exam BP 120/75 mmHg  Pulse 81  Temp(Src) 97.9 F (36.6 C) (Oral)  Ht 5\' 3"  (1.6 m)  Wt 144 lb 3 oz (65.403 kg)  BMI 25.55 kg/m2  SpO2 99% GEN: NAD, well-appearing HEENT: atraumatic, normocephalic, sclera clear, extraocular movements intact, TMs w/clear retraction bilaterally, oropharynx clear with moist mucous membranes, Nares patent, frontal and maxillary sinus tenderness to palpation bilaterally, neck supple, no lymphadenopathy Pulmonary: Clear to auscultation bilaterally, normal effort, no wheezes or crackles Abdomen: Soft, nontender Cardiovascular: Regular rate and rhythm, no murmurs rubs or gallops SKIN: Lips with 3-4 scattered 22mm raised nodules, nontender or indurated, no warmth or purulence, no erythema, at vermillion border of upper and lower lips  Assessment:     Wendy Mcclain is a 64 y.o. female here for cough and face pain.    Plan:     # See problem list and after visit summary for problem-specific plans. - Bumps on lips are most likely irritated/scarred cold sores that patient has a hard time not touching/irritating.  Discussed avoiding touching and return in 2-4 weeks if not improving.  Follow-up: Follow up in 1 week if symptoms not improving.   Hilton Sinclair, MD [place]

## 2015-01-07 NOTE — Patient Instructions (Signed)
This looks like a sinusitis again. Because of how long you have been dealing with this, take the antibiotic again twice daily for 10 days. Drink plenty of warm fluids and get lots of rest. If you start having any trouble breathing or severe worsening of symptoms, please seek immediate reevaluation. Use Flonase every day to help keep sinuses from getting too congested and avoid infections. You can also try nasal saline sprays that you can get over-the-counter.  Sinusitis Sinusitis is redness, soreness, and puffiness (inflammation) of the air pockets in the bones of your face (sinuses). The redness, soreness, and puffiness can cause air and mucus to get trapped in your sinuses. This can allow germs to grow and cause an infection.  HOME CARE   Drink enough fluids to keep your pee (urine) clear or pale yellow.  Use a humidifier in your home.  Run a hot shower to create steam in the bathroom. Sit in the bathroom with the door closed. Breathe in the steam 3-4 times a day.  Put a warm, moist washcloth on your face 3-4 times a day, or as told by your doctor.  Use salt water sprays (saline sprays) to wet the thick fluid in your nose. This can help the sinuses drain.  Only take medicine as told by your doctor. GET HELP RIGHT AWAY IF:   Your pain gets worse.  You have very bad headaches.  You are sick to your stomach (nauseous).  You throw up (vomit).  You are very sleepy (drowsy) all the time.  Your face is puffy (swollen).  Your vision changes.  You have a stiff neck.  You have trouble breathing. MAKE SURE YOU:   Understand these instructions.  Will watch your condition.  Will get help right away if you are not doing well or get worse. Document Released: 01/05/2008 Document Revised: 04/12/2012 Document Reviewed: 02/22/2012 [facility] Patient Information 2015 [place], [place]. This information is not intended to replace advice given to you by your health care provider. Make sure  you discuss any questions you have with your health care provider.

## 2015-01-10 NOTE — Assessment & Plan Note (Signed)
Frontal maxillary sinusitis with duration of 2 weeks. Due to duration and continued symptoms along with tenderness on exam, we will re-dose augmentin 10 day course (rcd in Jan). Lungs clear and breathing easily on exam with normal vitals/O2 sat. -Augmentin prescribed  -warm fluids, rest, daily Flonase for likely postnasal drip causing cough, nasal saline rinse -Return precautions reviewed; if not improving, would check urine legionella due to recent cruise.

## 2015-02-12 ENCOUNTER — Telehealth: Payer: Self-pay | Admitting: Family Medicine

## 2015-02-12 NOTE — Telephone Encounter (Signed)
Needs OV for antibiotics. Please advise

## 2015-02-12 NOTE — Telephone Encounter (Signed)
LVM for pt to call office. Please advise her of the information below. Ottis Stain, CMA

## 2015-02-12 NOTE — Telephone Encounter (Signed)
Sinus infection has come back Can something be called in?

## 2015-02-13 ENCOUNTER — Ambulatory Visit: Payer: Medicare Other | Admitting: Family Medicine

## 2015-02-17 ENCOUNTER — Ambulatory Visit (INDEPENDENT_AMBULATORY_CARE_PROVIDER_SITE_OTHER): Payer: Medicare Other | Admitting: Family Medicine

## 2015-02-17 ENCOUNTER — Encounter: Payer: Self-pay | Admitting: Family Medicine

## 2015-02-17 VITALS — BP 128/71 | HR 86 | Temp 97.7°F | Ht 63.0 in | Wt 145.1 lb

## 2015-02-17 DIAGNOSIS — J029 Acute pharyngitis, unspecified: Secondary | ICD-10-CM | POA: Diagnosis not present

## 2015-02-17 LAB — POCT RAPID STREP A (OFFICE): Rapid Strep A Screen: NEGATIVE

## 2015-02-17 MED ORDER — CEPHALEXIN 500 MG PO CAPS: 500.0000 mg | ORAL_CAPSULE | Freq: Two times a day (BID) | ORAL | Status: DC

## 2015-02-17 NOTE — Progress Notes (Signed)
    Subjective   Wendy Mcclain is a 64 y.o. female that presents for a same day visit  1. Throat irritation: Recently treated for a previous sinus infection last month. She completed the antibiotic therapy. Symptoms returned three weeks ago. Has associated nonproductive cough, oral blisters and sore throat. Symptoms have been worsening. She feels warm but no measured fevers. No chills. She has a history of asthma and uses her albuterol about once per month.  ROS Per HPI  History  Substance Use Topics  . Smoking status: Never Smoker   . Smokeless tobacco: Never Used  . Alcohol Use: No    Allergies  Allergen Reactions  . Metformin And Related Diarrhea    Objective   BP 128/71 mmHg  Pulse 86  Temp(Src) 97.7 F (36.5 C) (Oral)  Ht 5\' 3"  (1.6 m)  Wt 145 lb 1.6 oz (65.817 kg)  BMI 25.71 kg/m2  General: Well appearing, no distress HEENT: Oropharynx non-erythematous with a white lesion on left tonsil. Do not feel obvious cervical adenopathy and no tenderness  Assessment and Plan   No orders of the defined types were placed in this encounter.    Sore throat: symptoms not very consistent with strep. Rapid strep negative. Lesion noted on tonsil that appears to be calcified lesion vs exudate. Centor score of 0  Will treat as strep throat with Keflex 500mg  BID x10 days since she has a recent course of augmentin  GAS probe

## 2015-02-17 NOTE — Patient Instructions (Signed)
Thank you for coming to see me today. It was a pleasure. Today we talked about:   Sore throat: This could be related to a strep infection. Your rapid strep was negative but I will prescribe antibiotics for you. If symptoms worsen or persist past one week, please return as we may need to refer you to the [facility] doctor.  If you have any questions or concerns, please do not hesitate to call the office at [phone].  Sincerely,  Cordelia Poche, MD

## 2015-02-18 ENCOUNTER — Encounter: Payer: Self-pay | Admitting: Family Medicine

## 2015-02-18 LAB — STREP A DNA PROBE: GASP: NEGATIVE

## 2015-03-17 DIAGNOSIS — M8580 Other specified disorders of bone density and structure, unspecified site: Secondary | ICD-10-CM | POA: Diagnosis not present

## 2015-03-17 DIAGNOSIS — Z794 Long term (current) use of insulin: Secondary | ICD-10-CM | POA: Diagnosis not present

## 2015-03-17 DIAGNOSIS — E1142 Type 2 diabetes mellitus with diabetic polyneuropathy: Secondary | ICD-10-CM | POA: Diagnosis not present

## 2015-03-17 DIAGNOSIS — E039 Hypothyroidism, unspecified: Secondary | ICD-10-CM | POA: Diagnosis not present

## 2015-03-21 ENCOUNTER — Telehealth: Payer: Self-pay | Admitting: Family Medicine

## 2015-03-21 DIAGNOSIS — J029 Acute pharyngitis, unspecified: Secondary | ICD-10-CM

## 2015-03-21 NOTE — Telephone Encounter (Signed)
Patient needs to be seen before starting a new course of antibiotics. Given that it is late on Friday afternoon, she will probably need to go to an urgent care.  Wendy Mcclain. Jerline Pain, [place] Resident PGY-2 03/21/2015 4:02 PM

## 2015-03-21 NOTE — Telephone Encounter (Signed)
Called patient back regarding the symptoms. Patient stated now she has a lot of thick yellow mucus along with the other symptoms.  Patient denies fever.  Patient requested if she could get a z-pac instead of the last antibiotics.  Please send response to RN Team, clinic nurse Tamika, RN will be leaving for the day.  Derl Barrow, RN

## 2015-03-21 NOTE — Telephone Encounter (Signed)
Pt seen last month for pharyngitis, is sick again with same symptoms except this time she has yellow/thicker mucus. Wants to know if something can be called in or does she have to go to urgent care.

## 2015-03-24 NOTE — Telephone Encounter (Signed)
Called patient this morning advised her that she would need to be seen by a provider for any new treatment.  Patient stated she would wait and see how she feels today, if not any better by tomorrow she would call for an appointment.  Derl Barrow, RN

## 2015-04-28 ENCOUNTER — Other Ambulatory Visit: Payer: Self-pay | Admitting: Family Medicine

## 2015-07-22 DIAGNOSIS — E039 Hypothyroidism, unspecified: Secondary | ICD-10-CM | POA: Diagnosis not present

## 2015-07-22 DIAGNOSIS — M8580 Other specified disorders of bone density and structure, unspecified site: Secondary | ICD-10-CM | POA: Diagnosis not present

## 2015-07-22 DIAGNOSIS — Z794 Long term (current) use of insulin: Secondary | ICD-10-CM | POA: Diagnosis not present

## 2015-07-22 DIAGNOSIS — E1142 Type 2 diabetes mellitus with diabetic polyneuropathy: Secondary | ICD-10-CM | POA: Diagnosis not present

## 2015-07-22 DIAGNOSIS — E1165 Type 2 diabetes mellitus with hyperglycemia: Secondary | ICD-10-CM | POA: Diagnosis not present

## 2015-07-30 ENCOUNTER — Ambulatory Visit (INDEPENDENT_AMBULATORY_CARE_PROVIDER_SITE_OTHER): Payer: Medicare Other | Admitting: Family Medicine

## 2015-07-30 VITALS — BP 132/84 | HR 74 | Temp 97.5°F | Resp 16 | Ht 63.0 in | Wt 146.4 lb

## 2015-07-30 DIAGNOSIS — J0140 Acute pansinusitis, unspecified: Secondary | ICD-10-CM

## 2015-07-30 MED ORDER — FLUTICASONE PROPIONATE 50 MCG/ACT NA SUSP: 2.0000 | Freq: Every day | NASAL | Status: DC

## 2015-07-30 MED ORDER — AMOXICILLIN 875 MG PO TABS: 875.0000 mg | ORAL_TABLET | Freq: Two times a day (BID) | ORAL | Status: DC

## 2015-07-30 NOTE — Progress Notes (Signed)
Patient ID: Wendy Mcclain, female    DOB: 08/19/1950  Age: 64 y.o. MRN: [id number]  Chief Complaint  Patient presents with  . Sinusitis    x 3 weeks    Subjective:   Patient has a three-week history of an upper respiratory congestion which is just hung on with nasal congestion. Over the last few days she started having more purulent mucus with blood in it. No fevers. Not coughing a lot, but cough some.  Current allergies, medications, problem list, past/family and social histories reviewed.  Objective:  BP 132/84 mmHg  Pulse 74  Temp(Src) 97.5 F (36.4 C)  Resp 16  Ht 5\' 3"  (1.6 m)  Wt 146 lb 6.4 oz (66.407 kg)  BMI 25.94 kg/m2  SpO2 98%  Pleasant lady, alert and oriented. TMs normal. Nose clear. Sinuses are tender in the maxillary and frontal areas. Throat clear. Neck supple without nodes. Chest clear to auscultation. Heart regular without murmur.  Assessment & Plan:   Assessment: 1. Acute pansinusitis, recurrence not specified       Plan: See instructions  No orders of the defined types were placed in this encounter.    Meds ordered this encounter  Medications  . Insulin Degludec (TRESIBA FLEXTOUCH [place])    Sig: Inject into the skin.  . fluticasone (FLONASE) 50 MCG/ACT nasal spray    Sig: Place 2 sprays into both nostrils daily.    Dispense:  16 g    Refill:  6  . amoxicillin (AMOXIL) 875 MG tablet    Sig: Take 1 tablet (875 mg total) by mouth 2 (two) times daily.    Dispense:  20 tablet    Refill:  0         Patient Instructions  Use Flonase 2 sprays each nostril twice daily for about 4 days, then decrease to once daily usage  Take amoxicillin 875 one twice daily for infection  Drink lots of fluids and get plenty of rest  Recommend taking plain Mucinex to try and thin the secretions.  Return if getting worse on improving     Return if symptoms worsen or fail to improve.   Zakariah Urwin, MD 07/30/2015

## 2015-07-30 NOTE — Patient Instructions (Signed)
Use Flonase 2 sprays each nostril twice daily for about 4 days, then decrease to once daily usage  Take amoxicillin 875 one twice daily for infection  Drink lots of fluids and get plenty of rest  Recommend taking plain Mucinex to try and thin the secretions.  Return if getting worse on improving

## 2015-08-14 ENCOUNTER — Emergency Department (HOSPITAL_COMMUNITY)
Admission: EM | Admit: 2015-08-14 | Discharge: 2015-08-14 | Disposition: A | Discharge status: Home / Self Care | Payer: Medicare Other | Attending: Emergency Medicine | Admitting: Emergency Medicine

## 2015-08-14 ENCOUNTER — Emergency Department (HOSPITAL_COMMUNITY): Payer: Medicare Other

## 2015-08-14 ENCOUNTER — Encounter (HOSPITAL_COMMUNITY): Payer: Self-pay | Admitting: Emergency Medicine

## 2015-08-14 DIAGNOSIS — Z8679 Personal history of other diseases of the circulatory system: Secondary | ICD-10-CM | POA: Insufficient documentation

## 2015-08-14 DIAGNOSIS — E119 Type 2 diabetes mellitus without complications: Secondary | ICD-10-CM | POA: Diagnosis not present

## 2015-08-14 DIAGNOSIS — Z79899 Other long term (current) drug therapy: Secondary | ICD-10-CM | POA: Diagnosis not present

## 2015-08-14 DIAGNOSIS — R0789 Other chest pain: Secondary | ICD-10-CM | POA: Diagnosis not present

## 2015-08-14 DIAGNOSIS — Z794 Long term (current) use of insulin: Secondary | ICD-10-CM | POA: Insufficient documentation

## 2015-08-14 DIAGNOSIS — K219 Gastro-esophageal reflux disease without esophagitis: Secondary | ICD-10-CM | POA: Insufficient documentation

## 2015-08-14 DIAGNOSIS — J45909 Unspecified asthma, uncomplicated: Secondary | ICD-10-CM | POA: Diagnosis not present

## 2015-08-14 DIAGNOSIS — Z792 Long term (current) use of antibiotics: Secondary | ICD-10-CM | POA: Insufficient documentation

## 2015-08-14 DIAGNOSIS — R079 Chest pain, unspecified: Secondary | ICD-10-CM | POA: Diagnosis not present

## 2015-08-14 DIAGNOSIS — Z7951 Long term (current) use of inhaled steroids: Secondary | ICD-10-CM | POA: Diagnosis not present

## 2015-08-14 LAB — CBC
HEMATOCRIT: 39.6 % (ref 36.0–46.0)
HEMOGLOBIN: 13.4 g/dL (ref 12.0–15.0)
MCH: 30.5 pg (ref 26.0–34.0)
MCHC: 33.8 g/dL (ref 30.0–36.0)
MCV: 90.2 fL (ref 78.0–100.0)
Platelets: 244 10*3/uL (ref 150–400)
RBC: 4.39 MIL/uL (ref 3.87–5.11)
RDW: 13 % (ref 11.5–15.5)
WBC: 7.8 10*3/uL (ref 4.0–10.5)

## 2015-08-14 LAB — BASIC METABOLIC PANEL
Anion gap: 12 (ref 5–15)
BUN: 12 mg/dL (ref 6–20)
CHLORIDE: 102 mmol/L (ref 101–111)
CO2: 22 mmol/L (ref 22–32)
Calcium: 9.1 mg/dL (ref 8.9–10.3)
Creatinine, Ser: 0.86 mg/dL (ref 0.44–1.00)
GFR calc non Af Amer: >60 mL/min (ref >60)
Glucose, Bld: 195 mg/dL — ABNORMAL HIGH (ref 65–99)
POTASSIUM: 3.6 mmol/L (ref 3.5–5.1)
Sodium: 136 mmol/L (ref 135–145)

## 2015-08-14 LAB — I-STAT TROPONIN, ED
Troponin i, poc: 0 ng/mL (ref 0.00–0.08)
Troponin i, poc: 0 ng/mL (ref 0.00–0.08)

## 2015-08-14 MED ORDER — KETOROLAC TROMETHAMINE 30 MG/ML IJ SOLN
15.0000 mg | Freq: Once | INTRAMUSCULAR | Status: AC
  Administered 2015-08-14: 15 mg via INTRAVENOUS
  Filled 2015-08-14: qty 1

## 2015-08-14 NOTE — ED Notes (Signed)
Pt to ED via [facility] for sharp L sided chest pain that radiates to back with sob and nausea x 1 hour.  Reports diaphoresis earlier in the night.  EMS administered Asa 324 mg and 2 SL NTG.  Pain decreased from 10/10- 7/10 with NTG.  Reports 3-4 episodes of same in the past few weeks.

## 2015-08-14 NOTE — Discharge Instructions (Signed)

## 2015-08-14 NOTE — ED Provider Notes (Signed)
TIME SEEN:  By signing my name below, I, Arianna Nassar, attest that this documentation has been prepared under the direction and in the presence of [employer], DO. Electronically Signed: Julien Nordmann, ED Scribe. 08/14/2015. 3:36 AM.   CHIEF COMPLAINT: Chest pain  HPI:  HPI Comments: Wendy Mcclain is a 65 y.o. female with a hx of DM, GERD brought in by ambulance, who presents to the Emergency Department complaining of intermittent, gradual worsening, moderate, sharp chest pain that radiates into her back onset two weeks ago. She notes rubbing her chest and laying down alleviates her pain. Pt stands on her feet all days and notes sometimes her chest pain will appear occasionally. Pain is not exertional or pleuritic. Pt has both paternal and maternal hx of heart problems. She has seen a cardiologist in the past for a stress test two years ago. She denies shortness of breath, nausea or vomiting, diaphoresis (contrary to nursing notes). No history of HTN, high cholesterol. Pt is a non-smoker.  ROS: See HPI Constitutional: no fever  Eyes: no drainage  ENT: no runny nose   Cardiovascular: chest pain  Resp: no SOB  GI: no vomiting GU: no dysuria Integumentary: no rash  Allergy: no hives  Musculoskeletal: no leg swelling  Neurological: no slurred speech ROS otherwise negative  PAST MEDICAL HISTORY/PAST SURGICAL HISTORY:  Past Medical History  Diagnosis Date  . Diabetes mellitus   . Arrhythmia   . Asthma   . GERD (gastroesophageal reflux disease)     MEDICATIONS:  Prior to Admission medications   Medication Sig Start Date End Date Taking? Authorizing Provider  albuterol (PROVENTIL HFA;VENTOLIN HFA) 108 (90 BASE) MCG/ACT inhaler Inhale 2 puffs into the lungs every 6 (six) hours as needed for wheezing or shortness of breath. 03/20/14   Ashly Windell Moulding, DO  amoxicillin (AMOXIL) 875 MG tablet Take 1 tablet (875 mg total) by mouth 2 (two) times daily. 07/30/15   Posey Boyer, MD   Canagliflozin (INVOKANA) 300 MG TABS Take 1 tablet by mouth daily. Reported on 07/30/2015    Historical Provider, MD  cephALEXin (KEFLEX) 500 MG capsule Take 1 capsule (500 mg total) by mouth 2 (two) times daily. Patient not taking: Reported on 07/30/2015 02/17/15   Mariel Aloe, MD  cyclobenzaprine (FLEXERIL) 10 MG tablet Take 1 tablet (10 mg total) by mouth 3 (three) times daily as needed for muscle spasms. Patient not taking: Reported on 07/30/2015 02/13/14   Roselee Culver, MD  diphenhydrAMINE (SOMINEX) 25 MG tablet Take 1 tablet (25 mg total) by mouth at bedtime as needed for sleep. Patient not taking: Reported on 07/30/2015 07/09/13   Gerda Diss, MD  ergocalciferol (VITAMIN D2) 50000 UNITS capsule Take 1 capsule (50,000 Units total) by mouth once a week. Patient not taking: Reported on 07/30/2015 05/20/14   Janora Norlander, DO  fluconazole (DIFLUCAN) 150 MG tablet Take 1 tablet (150 mg total) by mouth once. Repeat dose in 72 hours. Patient not taking: Reported on 07/30/2015 01/07/15   Hilton Sinclair, MD  fluticasone [facility]) 50 MCG/ACT nasal spray Place 2 sprays into both nostrils daily. Patient not taking: Reported on 07/30/2015 01/07/15   Hilton Sinclair, MD  fluticasone [facility]) 50 MCG/ACT nasal spray Place 2 sprays into both nostrils daily. 07/30/15   Posey Boyer, MD  HYDROcodone-homatropine [facility]) 5-1.5 MG/5ML syrup Take 5 mLs by mouth every 6 (six) hours as needed. Patient not taking: Reported on 07/30/2015 03/26/14   Linton Ham  Laney Pastor, MD  Insulin Degludec (TRESIBA FLEXTOUCH [place]) Inject into the skin.    Historical Provider, MD  insulin glargine (LANTUS) 100 UNIT/ML injection Inject 20 Units into the skin every morning. Reported on 07/30/2015 07/09/13   Gerda Diss, MD  lidocaine (XYLOCAINE) 2 % solution Use 1 teaspoon every 2 hours to swish and swallow or spit as needed for pain Patient not taking: Reported on 07/30/2015 03/26/14   Leandrew Koyanagi, MD   Multiple Vitamin (MULTIVITAMIN WITH MINERALS) TABS tablet Take 1 tablet by mouth daily. Patient not taking: Reported on 07/30/2015 07/09/13   Gerda Diss, MD  omeprazole (PRILOSEC) 20 MG capsule Take 1 capsule (20 mg total) by mouth daily. Patient not taking: Reported on 07/30/2015 05/20/14   Janora Norlander, DO  scopolamine (TRANSDERM-SCOP) 1 MG/3DAYS Place 1 patch (1.5 mg total) onto the skin every 3 (three) days. Patient not taking: Reported on 07/30/2015 04/08/14   Leandrew Koyanagi, MD    ALLERGIES:  Allergies  Allergen Reactions  . Metformin And Related Diarrhea    SOCIAL HISTORY:  Social History  Substance Use Topics  . Smoking status: Never Smoker   . Smokeless tobacco: Never Used  . Alcohol Use: No    FAMILY HISTORY: Family History  Problem Relation Age of Onset  . Aortic aneurysm Paternal Aunt     2 aunts died at age 61  . Alzheimer's disease Mother   . Cancer Father     esophageal  . Cancer Sister     stomach  . Cancer Paternal Grandfather     mouth & larynx  . Cancer Paternal Aunt     colon  . Heart disease    . Diabetes    . Hyperlipidemia    . Hypertension    . Stroke    . Thyroid disease Sister   . Colon cancer Neg Hx   . Hyperlipidemia Daughter   . Thyroid disease Daughter     EXAM: Triage vitals: BP 142/65 mmHg  Pulse 82  Temp(Src) 97.2 F (36.2 C) (Oral)  Resp 13  Ht 5\' 3"  (1.6 m)  Wt 146 lb (66.225 kg)  BMI 25.87 kg/m2  SpO2 97% CONSTITUTIONAL: Alert and oriented and responds appropriately to questions. Well-appearing; well-nourished HEAD: Normocephalic EYES: Conjunctivae clear, PERRL ENT: normal nose; no rhinorrhea; moist mucous membranes; pharynx without lesions noted NECK: Supple, no meningismus, no LAD  CARD: RRR; S1 and S2 appreciated; no murmurs, no clicks, no rubs, no gallops CHEST: TTP over left anterior chest that reproduces her pain with no ecchymosis, crepitus or deformity RESP: Normal chest excursion without  splinting or tachypnea; breath sounds clear and equal bilaterally; no wheezes, no rhonchi, no rales, no hypoxia or respiratory distress, speaking full sentences ABD/GI: Normal bowel sounds; non-distended; soft, non-tender, no rebound, no guarding, no peritoneal signs BACK:  The back appears normal and is non-tender to palpation, there is no CVA tenderness EXT: Normal ROM in all joints; non-tender to palpation; no edema; normal capillary refill; no cyanosis, no calf tenderness or swelling    SKIN: Normal color for age and race; warm NEURO: Moves all extremities equally, sensation to light touch intact diffusely, cranial nerves II through XII intact PSYCH: The patient's mood and manner are appropriate. Grooming and personal hygiene are appropriate.  MEDICAL DECISION MAKING: Patient here with atypical chest pain. She does have risk factors for ACS including age and diabetes the pain is completely reproducible with palpation of her chest wall and improves when she  rubs this area. It is not exertional or pleuritic. Her EKG shows no ischemic changes since prior in 2002. First troponin is negative. Plan to repeat second troponin and if this is negative will discharge patient home. She received aspirin and nitroglycerin with EMS with only minimal relief. She agrees with plan for discharge and will follow-up with her cardiologist.  ED PROGRESS: 5:30 AM  Pt's second troponin is negative. Patient's pain was completely resolved with Toradol. I suspect this is muscular skeletal in nature. She does have a cardiologist and PCP for follow-up. Have recommended outpatient follow-up. Discussed return precautions. She verbalized understanding and is comfortable with this plan.     EKG Interpretation  Date/Time:  Thursday August 14 2015 01:54:02 EST Ventricular Rate:  81 PR Interval:  186 QRS Duration: 96 QT Interval:  381 QTC Calculation: 442 R Axis:   19 Text Interpretation:  Sinus rhythm Low voltage, extremity  and precordial leads No significant change since 2002 Confirmed by Rashan Patient,  DO, Lashea Goda [phone]) on 08/14/2015 2:00:43 AM       I personally performed the services described in this documentation, which was scribed in my presence. The recorded information has been reviewed and is accurate.   [place], DO 08/14/15 [phone]

## 2015-08-15 ENCOUNTER — Ambulatory Visit (INDEPENDENT_AMBULATORY_CARE_PROVIDER_SITE_OTHER): Payer: Medicare Other | Admitting: Family Medicine

## 2015-08-15 ENCOUNTER — Encounter: Payer: Self-pay | Admitting: Family Medicine

## 2015-08-15 VITALS — BP 129/66 | HR 87 | Temp 98.4°F | Wt 142.0 lb

## 2015-08-15 DIAGNOSIS — R079 Chest pain, unspecified: Secondary | ICD-10-CM | POA: Insufficient documentation

## 2015-08-15 DIAGNOSIS — J0100 Acute maxillary sinusitis, unspecified: Secondary | ICD-10-CM

## 2015-08-15 DIAGNOSIS — K219 Gastro-esophageal reflux disease without esophagitis: Secondary | ICD-10-CM | POA: Diagnosis not present

## 2015-08-15 MED ORDER — AMOXICILLIN-POT CLAVULANATE 875-125 MG PO TABS: 1.0000 | ORAL_TABLET | Freq: Two times a day (BID) | ORAL | Status: DC

## 2015-08-15 MED ORDER — CETIRIZINE HCL 10 MG PO TABS: 10.0000 mg | ORAL_TABLET | Freq: Every day | ORAL | Status: DC

## 2015-08-15 MED ORDER — GUAIFENESIN ER 600 MG PO TB12: 600.0000 mg | ORAL_TABLET | Freq: Two times a day (BID) | ORAL | Status: DC

## 2015-08-15 MED ORDER — OMEPRAZOLE 20 MG PO CPDR: 20.0000 mg | DELAYED_RELEASE_CAPSULE | Freq: Every day | ORAL | Status: DC

## 2015-08-15 NOTE — Progress Notes (Signed)
Patient ID: Wendy Mcclain, female   DOB: 10-22-1950, 65 y.o.   MRN: [id number]    Subjective: CC: sinus congestion HPI: Patient is a 65 y.o. female with a past medical history of asthma and allergic rhinitis presenting to clinic today for a same day appt for sinus congestion.  Sinus pressure: Sinus congestion for approximately 1 month. Went to urgent care on 12/28 and was prescribed amoxicillin. Symptoms improved but didn't resolve completely. She noted worsening symptoms a few days ago. She currently has sinus pressure and yellow/green nasal discharge. She has an occasional cough. More fatigued recently.  No fevers. Multiple sick contacts. She's been using Flonase daily. Needs albuterol refilled. She denies shortness of breath.   Patient presented to ED on 08/14/15 for chest pain that was intermittent, sharp left/centrally located and radiated to back that was alleviated by rubbing her chest and lying down; it was also reproducible with palpation. She was discharged home and advised to f/u with PCP.  No chest pain now. Occasionally feels palpitations and notes it has been some times since she saw a cardiologist.    Social History: non-smoker  Health Maintenance: had a reaction on annual flu vaccine in the past (arm swelling and fever)  ROS: All other systems reviewed and are negative.  Past Medical History Patient Active Problem List   Diagnosis Date Noted  . Chest pain 08/15/2015  . Acute sinusitis 08/19/2014  . Yeast infection 08/19/2014  . Vitamin D deficiency 05/20/2014  . Gastro-esophageal reflux 03/20/2014  . Allergic rhinitis 07/09/2013  . Sleep disorder 07/09/2013  . Elevated blood pressure (not hypertension) 07/09/2013  . Mood disorder ([place]) 11/23/2011  . DM (diabetes mellitus), type 2, uncontrolled ([place]) 11/23/2011  . Health care maintenance 11/23/2011    Medications- reviewed and updated Current Outpatient Prescriptions  Medication Sig Dispense Refill  . albuterol  (PROVENTIL HFA;VENTOLIN HFA) 108 (90 BASE) MCG/ACT inhaler Inhale 2 puffs into the lungs every 6 (six) hours as needed for wheezing or shortness of breath. 1 Inhaler 1  . amoxicillin-clavulanate (AUGMENTIN) 875-125 MG tablet Take 1 tablet by mouth 2 (two) times daily. 20 tablet 0  . cetirizine (ZYRTEC) 10 MG tablet Take 1 tablet (10 mg total) by mouth daily. 30 tablet 0  . ergocalciferol (VITAMIN D2) 50000 UNITS capsule Take 1 capsule (50,000 Units total) by mouth once a week. (Patient not taking: Reported on 07/30/2015) 4 capsule 1  . guaiFENesin (MUCINEX) 600 MG 12 hr tablet Take 1 tablet (600 mg total) by mouth 2 (two) times daily. For thinning of secretions 30 tablet 0  . Multiple Vitamin (MULTIVITAMIN WITH MINERALS) TABS tablet Take 1 tablet by mouth daily. (Patient not taking: Reported on 07/30/2015)    . Multiple Vitamin (MULTIVITAMIN) tablet Take 1 tablet by mouth daily.    Marland Kitchen omeprazole (PRILOSEC) 20 MG capsule Take 1 capsule (20 mg total) by mouth daily. 90 capsule 1  . scopolamine (TRANSDERM-SCOP) 1 MG/3DAYS Place 1 patch (1.5 mg total) onto the skin every 3 (three) days. (Patient not taking: Reported on 07/30/2015) 10 patch 0  . TRESIBA FLEXTOUCH 100 UNIT/ML SOPN Inject 32 Units into the skin daily.  1   No current facility-administered medications for this visit.    Objective: Office vital signs reviewed. BP 129/66 mmHg  Pulse 87  Temp(Src) 98.4 F (36.9 C) (Oral)  Wt 142 lb (64.411 kg)   Physical Examination:  General: Awake, alert, well- nourished, NAD ENMT:  Erythema of the ear  Canals. TMs intact, normal light  reflex, mild erythema, no bulging. Nasal turbinates moist with yellow drainage noted on the left. MMM, Oropharynx clear without erythema or tonsillar exudate/hypertrophy Eyes: Conjunctiva non-injected. PERRL. Significant frontal and maxillary tenderness.  Cardio: RRR, no m/r/g noted. No pain with palpation over the anterior chest wall. 2+ peripheral pulses. No pitting  edema.  Pulm: No increased WOB.  CTAB, without wheezes, rhonchi or crackles noted.   Assessment/Plan: Acute sinusitis Frontomaxillary sinusitis. Given duration and non-response to amoxicillin, will re-treat with Augmentin. Lungs clear without any SOB.  - Augmentin x 10 days. - continue Flonase daily, Mucinex and Zyrtec (in case there is some allergic component given her history). - Return precautions reviewed.   Chest pain Chest pain resolved. Given palpitations patient would like a referral back to cardiology which I feel is reasonable given her complaints of palpitations- may need a holter monitor in the future if this continues. - Referral back to [facility]. - Discussed return precautions.     No orders of the defined types were placed in this encounter.    Meds ordered this encounter  Medications  . omeprazole (PRILOSEC) 20 MG capsule    Sig: Take 1 capsule (20 mg total) by mouth daily.    Dispense:  90 capsule    Refill:  1  . guaiFENesin (MUCINEX) 600 MG 12 hr tablet    Sig: Take 1 tablet (600 mg total) by mouth 2 (two) times daily. For thinning of secretions    Dispense:  30 tablet    Refill:  0  . amoxicillin-clavulanate (AUGMENTIN) 875-125 MG tablet    Sig: Take 1 tablet by mouth 2 (two) times daily.    Dispense:  20 tablet    Refill:  0  . cetirizine (ZYRTEC) 10 MG tablet    Sig: Take 1 tablet (10 mg total) by mouth daily.    Dispense:  30 tablet    Refill:  [place] PGY-2, [place]

## 2015-08-15 NOTE — Assessment & Plan Note (Signed)
Chest pain resolved. Given palpitations patient would like a referral back to cardiology which I feel is reasonable given her complaints of palpitations- may need a holter monitor in the future if this continues. - Referral back to [facility]. - Discussed return precautions.

## 2015-08-15 NOTE — Patient Instructions (Signed)
I hope you feel better. Take Augmentin twice daily for 10 days. Continue to use Flonase daily  Take Mucinex 600mg  twice daily as needed for sinus pressure to help thin secretions Take Zyrtec daily until you start to feel better.  Sinusitis, Adult Sinusitis is redness, soreness, and inflammation of the paranasal sinuses. Paranasal sinuses are air pockets within the bones of your face. They are located beneath your eyes, in the middle of your forehead, and above your eyes. In healthy paranasal sinuses, mucus is able to drain out, and air is able to circulate through them by way of your nose. However, when your paranasal sinuses are inflamed, mucus and air can become trapped. This can allow bacteria and other germs to grow and cause infection. Sinusitis can develop quickly and last only a short time (acute) or continue over a long period (chronic). Sinusitis that lasts for more than 12 weeks is considered chronic. CAUSES Causes of sinusitis include:  Allergies.  Structural abnormalities, such as displacement of the cartilage that separates your nostrils (deviated septum), which can decrease the air flow through your nose and sinuses and affect sinus drainage.  Functional abnormalities, such as when the small hairs (cilia) that line your sinuses and help remove mucus do not work properly or are not present. SIGNS AND SYMPTOMS Symptoms of acute and chronic sinusitis are the same. The primary symptoms are pain and pressure around the affected sinuses. Other symptoms include:  Upper toothache.  Earache.  Headache.  Bad breath.  Decreased sense of smell and taste.  A cough, which worsens when you are lying flat.  Fatigue.  Fever.  Thick drainage from your nose, which often is green and may contain pus (purulent).  Swelling and warmth over the affected sinuses. DIAGNOSIS Your health care provider will perform a physical exam. During your exam, your health care provider may perform any  of the following to help determine if you have acute sinusitis or chronic sinusitis:  Look in your nose for signs of abnormal growths in your nostrils (nasal polyps).  Tap over the affected sinus to check for signs of infection.  View the inside of your sinuses using an imaging device that has a light attached (endoscope). If your health care provider suspects that you have chronic sinusitis, one or more of the following tests may be recommended:  Allergy tests.  Nasal culture. A sample of mucus is taken from your nose, sent to a lab, and screened for bacteria.  Nasal cytology. A sample of mucus is taken from your nose and examined by your health care provider to determine if your sinusitis is related to an allergy. TREATMENT Most cases of acute sinusitis are related to a viral infection and will resolve on their own within 10 days. Sometimes, medicines are prescribed to help relieve symptoms of both acute and chronic sinusitis. These may include pain medicines, decongestants, nasal steroid sprays, or saline sprays. However, for sinusitis related to a bacterial infection, your health care provider will prescribe antibiotic medicines. These are medicines that will help kill the bacteria causing the infection. Rarely, sinusitis is caused by a fungal infection. In these cases, your health care provider will prescribe antifungal medicine. For some cases of chronic sinusitis, surgery is needed. Generally, these are cases in which sinusitis recurs more than 3 times per year, despite other treatments. HOME CARE INSTRUCTIONS  Drink plenty of water. Water helps thin the mucus so your sinuses can drain more easily.  Use a humidifier.  Inhale steam  3-4 times a day (for example, sit in the bathroom with the shower running).  Apply a warm, moist washcloth to your face 3-4 times a day, or as directed by your health care provider.  Use saline nasal sprays to help moisten and clean your sinuses.  Take  medicines only as directed by your health care provider.  If you were prescribed either an antibiotic or antifungal medicine, finish it all even if you start to feel better. SEEK IMMEDIATE MEDICAL CARE IF:  You have increasing pain or severe headaches.  You have nausea, vomiting, or drowsiness.  You have swelling around your face.  You have vision problems.  You have a stiff neck.  You have difficulty breathing.   This information is not intended to replace advice given to you by your health care provider. Make sure you discuss any questions you have with your health care provider.   Document Released: 07/19/2005 Document Revised: 08/09/2014 Document Reviewed: 08/03/2011 Elsevier Interactive Patient Education [employer].

## 2015-08-15 NOTE — Assessment & Plan Note (Signed)
Frontomaxillary sinusitis. Given duration and non-response to amoxicillin, will re-treat with Augmentin. Lungs clear without any SOB.  - Augmentin x 10 days. - continue Flonase daily, Mucinex and Zyrtec (in case there is some allergic component given her history). - Return precautions reviewed.

## 2015-09-17 ENCOUNTER — Encounter: Payer: Self-pay | Admitting: Cardiovascular Disease

## 2015-09-18 ENCOUNTER — Ambulatory Visit (INDEPENDENT_AMBULATORY_CARE_PROVIDER_SITE_OTHER): Payer: Medicare Other | Admitting: Cardiovascular Disease

## 2015-09-18 ENCOUNTER — Encounter: Payer: Self-pay | Admitting: Cardiovascular Disease

## 2015-09-18 VITALS — BP 110/80 | HR 64 | Ht 63.0 in | Wt 146.8 lb

## 2015-09-18 DIAGNOSIS — I951 Orthostatic hypotension: Secondary | ICD-10-CM

## 2015-09-18 HISTORY — DX: Orthostatic hypotension: I95.1

## 2015-09-18 NOTE — Patient Instructions (Signed)

## 2015-09-18 NOTE — Progress Notes (Signed)
Cardiology Office Note   Date:  09/18/2015   ID:  Wendy Mcclain, DOB 1950/08/28, MRN [id number]  PCP:  Ronnie Doss, DO  Cardiologist:   Thayer Headings, MD   Chief Complaint  Patient presents with  . Chest Pain   Problem List 1. Heart murmur 2. Hx of syncope 3. Diabetes Mellitus      History of Present Illness: Wendy Mcclain is a 65 y.o. female who presents for heart murmur. Has a remote hx of syncope.   Has seen Dr. Caryl Comes and had a + tilt table test.  Has occasional CP and occasional lightheadedness  Has occasional CP - not necessarily associated with exertion .   No real exercise.  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  Eats a farily good diet.    May not drink enough water   Non smoker   Past Medical History  Diagnosis Date  . Diabetes mellitus   . Arrhythmia   . Asthma   . GERD (gastroesophageal reflux disease)     Past Surgical History  Procedure Laterality Date  . Appendectomy  1963  . Abdominal hysterectomy  1993  . Breast enhancement surgery  1986     Current Outpatient Prescriptions  Medication Sig Dispense Refill  . albuterol (PROVENTIL HFA;VENTOLIN HFA) 108 (90 BASE) MCG/ACT inhaler Inhale 2 puffs into the lungs every 6 (six) hours as needed for wheezing or shortness of breath. 1 Inhaler 1  . cetirizine (ZYRTEC) 10 MG tablet Take 1 tablet (10 mg total) by mouth daily. 30 tablet 0  . guaiFENesin (MUCINEX) 600 MG 12 hr tablet Take 1 tablet (600 mg total) by mouth 2 (two) times daily. For thinning of secretions 30 tablet 0  . Multiple Vitamin (MULTIVITAMIN) tablet Take 1 tablet by mouth daily.    Marland Kitchen omeprazole (PRILOSEC) 20 MG capsule Take 1 capsule (20 mg total) by mouth daily. 90 capsule 1   No current facility-administered medications for this visit.    Allergies:   Metformin and related    Social History:  The patient  reports that she has never smoked. She has never used smokeless tobacco. She reports  that she does not drink alcohol or use illicit drugs.   Family History:  The patient's family history includes Alzheimer's disease in her mother; Aortic aneurysm in her paternal aunt; Cancer in her father, paternal aunt, paternal grandfather, and sister; Hyperlipidemia in her daughter; Thyroid disease in her daughter and sister. There is no history of Colon cancer.    ROS:  Please see the history of present illness.    Review of Systems: Constitutional:  denies fever, chills, diaphoresis, appetite change and fatigue.  HEENT: denies photophobia, eye pain, redness, hearing loss, ear pain, congestion, sore throat, rhinorrhea, sneezing, neck pain, neck stiffness and tinnitus.  Respiratory: denies SOB, DOE, cough, chest tightness, and wheezing.  Cardiovascular: denies chest pain, palpitations and leg swelling.  Gastrointestinal: denies nausea, vomiting, abdominal pain, diarrhea, constipation, blood in stool.  Genitourinary: denies dysuria, urgency, frequency, hematuria, flank pain and difficulty urinating.  Musculoskeletal: denies  myalgias, back pain, joint swelling, arthralgias and gait problem.   Skin: denies pallor, rash and wound.  Neurological: denies dizziness, seizures, syncope, weakness, light-headedness, numbness and headaches.   Hematological: denies adenopathy, easy bruising, personal or family bleeding history.  Psychiatric/ Behavioral: denies suicidal ideation, mood changes, confusion, nervousness, sleep disturbance and agitation.       All other systems  are reviewed and negative.    PHYSICAL EXAM: VS:  BP 110/80 mmHg  Pulse 64  Ht 5\' 3"  (1.6 m)  Wt 146 lb 12.8 oz (66.588 kg)  BMI 26.01 kg/m2 , BMI Body mass index is 26.01 kg/(m^2). GEN: Well nourished, well developed, in no acute distress HEENT: normal Neck: no JVD, carotid bruits, or masses Cardiac: RRR; no murmurs, rubs, or gallops,no edema  Respiratory:  clear to auscultation bilaterally, normal work of  breathing GI: soft, nontender, nondistended, + BS MS: no deformity or atrophy Skin: warm and dry, no rash Neuro:  Strength and sensation are intact Psych: normal   EKG:  EKG is not ordered today. The ekg ordered Jan. 12, 2017  demonstrates NSR at 81.   No ST or T wave changes.   Recent Labs: 08/14/2015: BUN 12; Creatinine, Ser 0.86; Hemoglobin 13.4; Platelets 244; Potassium 3.6; Sodium 136    Lipid Panel    Component Value Date/Time   CHOL 186 03/26/2014 0848   TRIG 127 03/26/2014 0848   HDL 53 03/26/2014 0848   CHOLHDL 3.5 03/26/2014 0848   VLDL 25 03/26/2014 0848   LDLCALC 108* 03/26/2014 0848      Wt Readings from Last 3 Encounters:  09/18/15 146 lb 12.8 oz (66.588 kg)  08/15/15 142 lb (64.411 kg)  08/14/15 146 lb (66.225 kg)      Other studies Reviewed: Additional studies/ records that were reviewed today include: . Review of the above records demonstrates:    ASSESSMENT AND PLAN:  1.  Orthostatic hypotension:    She drinks lots of back through the day. I've encouraged her to drink more water through the day and also to incorporate a V8 into her daily routine.  She needs to cut out her diet Coke intake.  2. Heart murmur: She's been told she has a leaky valve. Her cardiac exam is fairly normal. She may have a very slight systolic murmur. We'll get her most recent echocardiogram from Dr. Irven Shelling office.       Current medicines are reviewed at length with the patient today.  The patient does not have concerns regarding medicines.  The following changes have been made:  no change  Labs/ tests ordered today include:  No orders of the defined types were placed in this encounter.     Disposition:   FU with me in 1 year      Nahser, Wonda Cheng, MD  09/18/2015 10:19 AM    [facility] [place], [place], [place]  [address] Phone: [phone]; Fax: [phone]   [facility]  [address] [place],    [address] [phone]   Fax [phone]

## 2015-10-29 DIAGNOSIS — M25561 Pain in right knee: Secondary | ICD-10-CM | POA: Diagnosis not present

## 2015-11-10 DIAGNOSIS — E039 Hypothyroidism, unspecified: Secondary | ICD-10-CM | POA: Diagnosis not present

## 2015-11-10 DIAGNOSIS — Z794 Long term (current) use of insulin: Secondary | ICD-10-CM | POA: Diagnosis not present

## 2015-11-10 DIAGNOSIS — E1142 Type 2 diabetes mellitus with diabetic polyneuropathy: Secondary | ICD-10-CM | POA: Diagnosis not present

## 2015-11-10 DIAGNOSIS — M25561 Pain in right knee: Secondary | ICD-10-CM | POA: Diagnosis not present

## 2015-11-13 ENCOUNTER — Other Ambulatory Visit: Payer: Self-pay | Admitting: Family Medicine

## 2015-11-18 DIAGNOSIS — M8580 Other specified disorders of bone density and structure, unspecified site: Secondary | ICD-10-CM | POA: Diagnosis not present

## 2015-11-18 DIAGNOSIS — E1165 Type 2 diabetes mellitus with hyperglycemia: Secondary | ICD-10-CM | POA: Diagnosis not present

## 2015-11-18 DIAGNOSIS — Z794 Long term (current) use of insulin: Secondary | ICD-10-CM | POA: Diagnosis not present

## 2015-11-18 DIAGNOSIS — E039 Hypothyroidism, unspecified: Secondary | ICD-10-CM | POA: Diagnosis not present

## 2015-11-18 DIAGNOSIS — E1142 Type 2 diabetes mellitus with diabetic polyneuropathy: Secondary | ICD-10-CM | POA: Diagnosis not present

## 2015-11-21 DIAGNOSIS — M1711 Unilateral primary osteoarthritis, right knee: Secondary | ICD-10-CM | POA: Diagnosis not present

## 2016-01-20 DIAGNOSIS — Z794 Long term (current) use of insulin: Secondary | ICD-10-CM | POA: Diagnosis not present

## 2016-01-20 DIAGNOSIS — M8580 Other specified disorders of bone density and structure, unspecified site: Secondary | ICD-10-CM | POA: Diagnosis not present

## 2016-01-20 DIAGNOSIS — E1142 Type 2 diabetes mellitus with diabetic polyneuropathy: Secondary | ICD-10-CM | POA: Diagnosis not present

## 2016-01-20 DIAGNOSIS — E039 Hypothyroidism, unspecified: Secondary | ICD-10-CM | POA: Diagnosis not present

## 2016-02-18 DIAGNOSIS — E119 Type 2 diabetes mellitus without complications: Secondary | ICD-10-CM | POA: Diagnosis not present

## 2016-04-07 DIAGNOSIS — E1142 Type 2 diabetes mellitus with diabetic polyneuropathy: Secondary | ICD-10-CM | POA: Diagnosis not present

## 2016-04-07 DIAGNOSIS — Z794 Long term (current) use of insulin: Secondary | ICD-10-CM | POA: Diagnosis not present

## 2016-04-07 DIAGNOSIS — M8580 Other specified disorders of bone density and structure, unspecified site: Secondary | ICD-10-CM | POA: Diagnosis not present

## 2016-04-07 DIAGNOSIS — E039 Hypothyroidism, unspecified: Secondary | ICD-10-CM | POA: Diagnosis not present

## 2016-04-17 DIAGNOSIS — J209 Acute bronchitis, unspecified: Secondary | ICD-10-CM | POA: Diagnosis not present

## 2016-06-09 ENCOUNTER — Other Ambulatory Visit: Payer: Self-pay | Admitting: Family Medicine

## 2016-06-20 DIAGNOSIS — L259 Unspecified contact dermatitis, unspecified cause: Secondary | ICD-10-CM | POA: Diagnosis not present

## 2016-09-24 ENCOUNTER — Telehealth: Payer: Self-pay | Admitting: Family Medicine

## 2016-09-24 NOTE — Telephone Encounter (Signed)
Scheduled AWV 10/12/16 at 1:30p. Pt no longer takes flu vaccines due to allergic reactions in the past. - Mesha Guinyard

## 2016-10-01 DIAGNOSIS — Z794 Long term (current) use of insulin: Secondary | ICD-10-CM | POA: Diagnosis not present

## 2016-10-01 DIAGNOSIS — E1142 Type 2 diabetes mellitus with diabetic polyneuropathy: Secondary | ICD-10-CM | POA: Diagnosis not present

## 2016-10-01 DIAGNOSIS — E038 Other specified hypothyroidism: Secondary | ICD-10-CM | POA: Diagnosis not present

## 2016-10-04 DIAGNOSIS — H6982 Other specified disorders of Eustachian tube, left ear: Secondary | ICD-10-CM | POA: Diagnosis not present

## 2016-10-06 DIAGNOSIS — E1142 Type 2 diabetes mellitus with diabetic polyneuropathy: Secondary | ICD-10-CM | POA: Diagnosis not present

## 2016-10-06 DIAGNOSIS — Z794 Long term (current) use of insulin: Secondary | ICD-10-CM | POA: Diagnosis not present

## 2016-10-06 DIAGNOSIS — E039 Hypothyroidism, unspecified: Secondary | ICD-10-CM | POA: Diagnosis not present

## 2016-10-06 DIAGNOSIS — M8580 Other specified disorders of bone density and structure, unspecified site: Secondary | ICD-10-CM | POA: Diagnosis not present

## 2016-10-11 ENCOUNTER — Encounter (HOSPITAL_COMMUNITY): Payer: Self-pay | Admitting: Emergency Medicine

## 2016-10-11 ENCOUNTER — Inpatient Hospital Stay (HOSPITAL_COMMUNITY)
Admission: EM | Admit: 2016-10-11 | Discharge: 2016-10-13 | DRG: 065 | Disposition: A | Discharge status: Home / Self Care | Payer: Medicare Other | Attending: Family Medicine | Admitting: Family Medicine

## 2016-10-11 ENCOUNTER — Emergency Department (HOSPITAL_COMMUNITY): Payer: Medicare Other

## 2016-10-11 ENCOUNTER — Inpatient Hospital Stay (HOSPITAL_COMMUNITY): Payer: Medicare Other

## 2016-10-11 DIAGNOSIS — G8194 Hemiplegia, unspecified affecting left nondominant side: Secondary | ICD-10-CM | POA: Diagnosis present

## 2016-10-11 DIAGNOSIS — R531 Weakness: Secondary | ICD-10-CM | POA: Diagnosis not present

## 2016-10-11 DIAGNOSIS — K219 Gastro-esophageal reflux disease without esophagitis: Secondary | ICD-10-CM | POA: Diagnosis present

## 2016-10-11 DIAGNOSIS — I208 Other forms of angina pectoris: Secondary | ICD-10-CM | POA: Diagnosis present

## 2016-10-11 DIAGNOSIS — I10 Essential (primary) hypertension: Secondary | ICD-10-CM

## 2016-10-11 DIAGNOSIS — E876 Hypokalemia: Secondary | ICD-10-CM | POA: Diagnosis present

## 2016-10-11 DIAGNOSIS — E1165 Type 2 diabetes mellitus with hyperglycemia: Secondary | ICD-10-CM | POA: Diagnosis present

## 2016-10-11 DIAGNOSIS — Z833 Family history of diabetes mellitus: Secondary | ICD-10-CM

## 2016-10-11 DIAGNOSIS — Z8249 Family history of ischemic heart disease and other diseases of the circulatory system: Secondary | ICD-10-CM

## 2016-10-11 DIAGNOSIS — Z823 Family history of stroke: Secondary | ICD-10-CM | POA: Diagnosis not present

## 2016-10-11 DIAGNOSIS — E1151 Type 2 diabetes mellitus with diabetic peripheral angiopathy without gangrene: Secondary | ICD-10-CM | POA: Diagnosis present

## 2016-10-11 DIAGNOSIS — E1159 Type 2 diabetes mellitus with other circulatory complications: Secondary | ICD-10-CM | POA: Diagnosis not present

## 2016-10-11 DIAGNOSIS — E785 Hyperlipidemia, unspecified: Secondary | ICD-10-CM | POA: Diagnosis not present

## 2016-10-11 DIAGNOSIS — J45909 Unspecified asthma, uncomplicated: Secondary | ICD-10-CM | POA: Diagnosis present

## 2016-10-11 DIAGNOSIS — Z7982 Long term (current) use of aspirin: Secondary | ICD-10-CM

## 2016-10-11 DIAGNOSIS — I639 Cerebral infarction, unspecified: Secondary | ICD-10-CM | POA: Diagnosis not present

## 2016-10-11 DIAGNOSIS — R29898 Other symptoms and signs involving the musculoskeletal system: Secondary | ICD-10-CM | POA: Diagnosis not present

## 2016-10-11 DIAGNOSIS — M6281 Muscle weakness (generalized): Secondary | ICD-10-CM | POA: Diagnosis not present

## 2016-10-11 DIAGNOSIS — R079 Chest pain, unspecified: Secondary | ICD-10-CM | POA: Diagnosis not present

## 2016-10-11 DIAGNOSIS — Z90710 Acquired absence of both cervix and uterus: Secondary | ICD-10-CM

## 2016-10-11 DIAGNOSIS — I63311 Cerebral infarction due to thrombosis of right middle cerebral artery: Secondary | ICD-10-CM | POA: Diagnosis not present

## 2016-10-11 DIAGNOSIS — G4733 Obstructive sleep apnea (adult) (pediatric): Secondary | ICD-10-CM | POA: Diagnosis not present

## 2016-10-11 DIAGNOSIS — E118 Type 2 diabetes mellitus with unspecified complications: Secondary | ICD-10-CM | POA: Diagnosis not present

## 2016-10-11 DIAGNOSIS — R51 Headache: Secondary | ICD-10-CM | POA: Diagnosis not present

## 2016-10-11 DIAGNOSIS — I6789 Other cerebrovascular disease: Secondary | ICD-10-CM | POA: Diagnosis not present

## 2016-10-11 DIAGNOSIS — Z794 Long term (current) use of insulin: Secondary | ICD-10-CM | POA: Diagnosis not present

## 2016-10-11 HISTORY — DX: Weakness: R53.1

## 2016-10-11 LAB — CBC WITH DIFFERENTIAL/PLATELET
Basophils Absolute: 0 10*3/uL (ref 0.0–0.1)
Basophils Relative: 1 %
Eosinophils Absolute: 0.1 10*3/uL (ref 0.0–0.7)
Eosinophils Relative: 2 %
HCT: 39.6 % (ref 36.0–46.0)
Hemoglobin: 13.4 g/dL (ref 12.0–15.0)
LYMPHS ABS: 2.1 10*3/uL (ref 0.7–4.0)
LYMPHS PCT: 39 %
MCH: 30.7 pg (ref 26.0–34.0)
MCHC: 33.8 g/dL (ref 30.0–36.0)
MCV: 90.8 fL (ref 78.0–100.0)
MONO ABS: 0.4 10*3/uL (ref 0.1–1.0)
MONOS PCT: 7 %
Neutro Abs: 2.9 10*3/uL (ref 1.7–7.7)
Neutrophils Relative %: 51 %
PLATELETS: 241 10*3/uL (ref 150–400)
RBC: 4.36 MIL/uL (ref 3.87–5.11)
RDW: 13.4 % (ref 11.5–15.5)
WBC: 5.5 10*3/uL (ref 4.0–10.5)

## 2016-10-11 LAB — RAPID URINE DRUG SCREEN, HOSP PERFORMED
Amphetamines: NOT DETECTED
Barbiturates: NOT DETECTED
Benzodiazepines: NOT DETECTED
Cocaine: NOT DETECTED
OPIATES: NOT DETECTED
Tetrahydrocannabinol: NOT DETECTED

## 2016-10-11 LAB — BASIC METABOLIC PANEL
Anion gap: 9 (ref 5–15)
BUN: 7 mg/dL (ref 6–20)
CHLORIDE: 107 mmol/L (ref 101–111)
CO2: 22 mmol/L (ref 22–32)
CREATININE: 0.81 mg/dL (ref 0.44–1.00)
Calcium: 9 mg/dL (ref 8.9–10.3)
GFR calc Af Amer: >60 mL/min (ref >60)
GFR calc non Af Amer: >60 mL/min (ref >60)
GLUCOSE: 142 mg/dL — AB (ref 65–99)
POTASSIUM: 3.4 mmol/L — AB (ref 3.5–5.1)
Sodium: 138 mmol/L (ref 135–145)

## 2016-10-11 LAB — I-STAT TROPONIN, ED: Troponin i, poc: 0 ng/mL (ref 0.00–0.08)

## 2016-10-11 LAB — GLUCOSE, CAPILLARY: Glucose-Capillary: 237 mg/dL — ABNORMAL HIGH (ref 65–99)

## 2016-10-11 LAB — TSH: TSH: 2.876 u[IU]/mL (ref 0.350–4.500)

## 2016-10-11 LAB — TROPONIN I: Troponin I: <0.03 ng/mL (ref <0.03)

## 2016-10-11 MED ORDER — ASPIRIN EC 81 MG PO TBEC
81.0000 mg | DELAYED_RELEASE_TABLET | Freq: Every day | ORAL | Status: DC
  Administered 2016-10-11: 81 mg via ORAL
  Filled 2016-10-11: qty 1

## 2016-10-11 MED ORDER — ACETAMINOPHEN 325 MG PO TABS: 650.0000 mg | ORAL_TABLET | Freq: Four times a day (QID) | ORAL | Status: DC | PRN

## 2016-10-11 MED ORDER — ACETAMINOPHEN 650 MG RE SUPP: 650.0000 mg | RECTAL | Status: DC | PRN

## 2016-10-11 MED ORDER — ACETAMINOPHEN 160 MG/5ML PO SOLN: 650.0000 mg | ORAL | Status: DC | PRN

## 2016-10-11 MED ORDER — SODIUM CHLORIDE 0.9 % IV SOLN
INTRAVENOUS | Status: AC
  Administered 2016-10-11: 22:00:00 via INTRAVENOUS

## 2016-10-11 MED ORDER — STROKE: EARLY STAGES OF RECOVERY BOOK
Freq: Once | Status: AC
  Administered 2016-10-12

## 2016-10-11 MED ORDER — PANTOPRAZOLE SODIUM 40 MG PO TBEC
40.0000 mg | DELAYED_RELEASE_TABLET | Freq: Every day | ORAL | Status: DC
  Administered 2016-10-11 – 2016-10-13 (×3): 40 mg via ORAL
  Filled 2016-10-11 (×3): qty 1

## 2016-10-11 MED ORDER — LORATADINE 10 MG PO TABS
10.0000 mg | ORAL_TABLET | Freq: Every day | ORAL | Status: DC
  Administered 2016-10-11 – 2016-10-13 (×3): 10 mg via ORAL
  Filled 2016-10-11 (×3): qty 1

## 2016-10-11 MED ORDER — SENNOSIDES-DOCUSATE SODIUM 8.6-50 MG PO TABS: 1.0000 | ORAL_TABLET | Freq: Every evening | ORAL | Status: DC | PRN

## 2016-10-11 MED ORDER — ENOXAPARIN SODIUM 40 MG/0.4ML ~~LOC~~ SOLN
40.0000 mg | SUBCUTANEOUS | Status: DC
  Administered 2016-10-11 – 2016-10-12 (×2): 40 mg via SUBCUTANEOUS
  Filled 2016-10-11 (×2): qty 0.4

## 2016-10-11 MED ORDER — ENOXAPARIN SODIUM 40 MG/0.4ML ~~LOC~~ SOLN: 40.0000 mg | SUBCUTANEOUS | Status: DC

## 2016-10-11 MED ORDER — ACETAMINOPHEN 325 MG PO TABS
650.0000 mg | ORAL_TABLET | ORAL | Status: DC | PRN
Start: 2016-10-11 — End: 2016-10-13

## 2016-10-11 MED ORDER — POTASSIUM CHLORIDE CRYS ER 20 MEQ PO TBCR
40.0000 meq | EXTENDED_RELEASE_TABLET | Freq: Two times a day (BID) | ORAL | Status: AC
  Administered 2016-10-11 (×2): 40 meq via ORAL
  Filled 2016-10-11 (×2): qty 2

## 2016-10-11 MED ORDER — INSULIN ASPART 100 UNIT/ML ~~LOC~~ SOLN
0.0000 [IU] | Freq: Three times a day (TID) | SUBCUTANEOUS | Status: DC
  Administered 2016-10-12 (×3): 2 [IU] via SUBCUTANEOUS
  Administered 2016-10-13: 3 [IU] via SUBCUTANEOUS

## 2016-10-11 NOTE — Consult Note (Signed)
Requesting Physician: Dr. Maryan Rued    Chief Complaint: leg weakness  History obtained from:  Patient     HPI:                                                                                                                                         Wendy Mcclain is an 66 y.o. female who woke up approximately 7 AM this morning and was unable to move her left lower leg. She states "it felt like a rag doll". Patient states she was able to use her upper body in her right leg to sit up in bed. She was able to stand on her right leg and lip to a chair. States that she thought that it would get better but the weakness persisted. She called her primary care doctor told her to come to the ED for evaluation. States that since this morning the weakness has improved. She still complains of mild weakness to the left lower extremity but has more mobility at this time. She denies any other extremity weakness including upper extremity is in right lower extremity. Does endorse having a mild left-sided frontal headache that started with the onset of her weakness symptoms this morning. Describes it as dull in nature.   Currently patient is not complaining of any headache. She feels her left leg has improved significantly. Her main complaint at this time is minor decreased sensation in her left leg left arm and the very lateral aspect of her V1 through V3 aspect on the left side. Patient denies having any visual or verbal problems.  Patient does not smoke, patient does take aspirin but only 3 times a week as per directed from her endocrinologist.  Date last known well: Date: 10/10/2016 Time last known well: Time: 20:00 tPA not given due to resolving symptoms  Past Medical History:  Diagnosis Date  . Arrhythmia   . Asthma   . Diabetes mellitus   . GERD (gastroesophageal reflux disease)     Past Surgical History:  Procedure Laterality Date  . ABDOMINAL HYSTERECTOMY  1993  . APPENDECTOMY  1963  . BREAST  ENHANCEMENT SURGERY  1986    Family History  Problem Relation Age of Onset  . Aortic aneurysm Paternal Aunt     2 aunts died at age 67  . Alzheimer's disease Mother   . Cancer Father     esophageal  . Cancer Sister     stomach  . Cancer Paternal Grandfather     mouth & larynx  . Cancer Paternal Aunt     colon  . Heart disease    . Diabetes    . Hyperlipidemia    . Hypertension    . Stroke    . Thyroid disease Sister   . Colon cancer Neg Hx   . Hyperlipidemia Daughter   . Thyroid disease Daughter    Social History:  reports that she has never smoked. She has never used smokeless tobacco. She reports that she does not drink alcohol or use drugs.  Allergies:  Allergies  Allergen Reactions  . Metformin And Related Diarrhea    Medications:                                                                                                                           No current facility-administered medications for this encounter.    Current Outpatient Prescriptions  Medication Sig Dispense Refill  . albuterol (PROVENTIL HFA;VENTOLIN HFA) 108 (90 BASE) MCG/ACT inhaler Inhale 2 puffs into the lungs every 6 (six) hours as needed for wheezing or shortness of breath. 1 Inhaler 1  . cetirizine (ZYRTEC) 10 MG tablet TAKE 1 TABLET BY MOUTH DAILY. 30 tablet 12  . Insulin Degludec (TRESIBA FLEXTOUCH) 100 UNIT/ML SOPN Inject 32 Units into the skin daily.    . Multiple Vitamin (MULTIVITAMIN) tablet Take 1 tablet by mouth daily.    Marland Kitchen omeprazole (PRILOSEC) 20 MG capsule Take 1 capsule (20 mg total) by mouth daily. 90 capsule 1  . guaiFENesin (MUCINEX) 600 MG 12 hr tablet Take 1 tablet (600 mg total) by mouth 2 (two) times daily. For thinning of secretions (Patient not taking: Reported on 10/11/2016) 30 tablet 0     ROS:                                                                                                                                       History obtained from the  patient  General ROS: negative for - chills, fatigue, fever, night sweats, weight gain or weight loss Psychological ROS: negative for - behavioral disorder, hallucinations, memory difficulties, mood swings or suicidal ideation Ophthalmic ROS: negative for - blurry vision, double vision, eye pain or loss of vision ENT ROS: negative for - epistaxis, nasal discharge, oral lesions, sore throat, tinnitus or vertigo Allergy and Immunology ROS: negative for - hives or itchy/watery eyes Hematological and Lymphatic ROS: negative for - bleeding problems, bruising or swollen lymph nodes Endocrine ROS: negative for - galactorrhea, hair pattern changes, polydipsia/polyuria or temperature intolerance Respiratory ROS: negative for - cough, hemoptysis, shortness of breath or wheezing Cardiovascular ROS: negative for - chest pain, dyspnea on exertion, edema or irregular heartbeat Gastrointestinal ROS: negative for - abdominal pain, diarrhea, hematemesis, nausea/vomiting or stool incontinence Genito-Urinary ROS: negative for -  dysuria, hematuria, incontinence or urinary frequency/urgency Musculoskeletal ROS: negative for - joint swelling or muscular weakness Neurological ROS: as noted in HPI Dermatological ROS: negative for rash and skin lesion changes  Neurologic Examination:                                                                                                      Blood pressure 116/83, pulse 74, temperature 97.7 F (36.5 C), temperature source Oral, resp. rate 17, height 5\' 3"  (1.6 m), weight 143 lb (64.9 kg), SpO2 98 %.  HEENT-  Normocephalic, no lesions, without obvious abnormality.  Normal external eye and conjunctiva.  Normal TM's bilaterally.  Normal auditory canals and external ears. Normal external nose, mucus membranes and septum.  Normal pharynx. Cardiovascular- S1, S2 normal, pulses palpable throughout   Lungs- chest clear, no wheezing, rales, normal symmetric air entry Abdomen- normal  findings: bowel sounds normal Extremities- no edema Lymph-no adenopathy palpable Musculoskeletal-no joint tenderness, deformity or swelling Skin-warm and dry, no hyperpigmentation, vitiligo, or suspicious lesions  Neurological Examination Mental Status: Alert, oriented, thought content appropriate.  Speech fluent without evidence of aphasia.  Able to follow 3 step commands without difficulty. Cranial Nerves: II:  Visual fields grossly normal,  III,IV, VI: ptosis not present, extra-ocular motions intact bilaterally, pupils equal, round, reactive to light and accommodation V,VII: smile symmetric, facial light touch sensation decreased on the very lateral aspect of the left portion of her face VIII: hearing normal bilaterally IX,X: uvula rises symmetrically XI: bilateral shoulder shrug XII: midline tongue extension Motor: Right : Upper extremity   5/5    Left:     Upper extremity   5/5  Lower extremity   5/5     Lower extremity   5/5 Tone and bulk:normal tone throughout; no atrophy noted Sensory: Pinprick and light touch intact throughout, right arm and leg. Patient states that her left arm and leg have decreased sensation compared to the right. Deep Tendon Reflexes: 2+ and symmetric throughout Plantars: Right: downgoing   Left: downgoing Cerebellar: normal finger-to-nose, and normal heel-to-shin test Gait: Not tested due to patient safety       Lab Results: Basic Metabolic Panel:  Recent Labs Lab 10/11/16 1222  NA 138  K 3.4*  CL 107  CO2 22  GLUCOSE 142*  BUN 7  CREATININE 0.81  CALCIUM 9.0    Liver Function Tests: No results for input(s): AST, ALT, ALKPHOS, BILITOT, PROT, ALBUMIN in the last 168 hours. No results for input(s): LIPASE, AMYLASE in the last 168 hours. No results for input(s): AMMONIA in the last 168 hours.  CBC:  Recent Labs Lab 10/11/16 1222  WBC 5.5  NEUTROABS 2.9  HGB 13.4  HCT 39.6  MCV 90.8  PLT 241    Cardiac Enzymes: No results  for input(s): CKTOTAL, CKMB, CKMBINDEX, TROPONINI in the last 168 hours.  Lipid Panel: No results for input(s): CHOL, TRIG, HDL, CHOLHDL, VLDL, LDLCALC in the last 168 hours.  CBG: No results for input(s): GLUCAP in the last 168 hours.  Microbiology: Results for orders placed or performed in  visit on 02/17/15  Strep A DNA Probe     Status: None   Collection Time: 02/17/15 11:10 AM  Result Value Ref Range Status   GASP NEGATIVE  Final    Coagulation Studies: No results for input(s): LABPROT, INR in the last 72 hours.  Imaging: Ct Head Wo Contrast  Result Date: 10/11/2016 CLINICAL DATA:  Left side weakness.  Headache EXAM: CT HEAD WITHOUT CONTRAST TECHNIQUE: Contiguous axial images were obtained from the base of the skull through the vertex without intravenous contrast. COMPARISON:  09/18/2011 FINDINGS: Brain: No acute intracranial abnormality. Specifically, no hemorrhage, hydrocephalus, mass lesion, acute infarction, or significant intracranial injury. Vascular: No hyperdense vessel or unexpected calcification. Skull: No acute calvarial abnormality. Sinuses/Orbits: Visualized paranasal sinuses and mastoids clear. Orbital soft tissues unremarkable. Other: None IMPRESSION: No intracranial abnormality. Electronically Signed   By: Rolm Baptise M.D.   On: 10/11/2016 12:46       Assessment and plan discussed with with attending physician and they are in agreement.    Etta Quill PA-C [facility] [phone]  10/11/2016, 2:20 PM   Assessment: 66 y.o. female who was last seen normal at approximately 8 PM last night. Patient woke this morning with a flaccid left leg. Since then her symptoms have significantly improved and only residual is her left arm leg decreased sensation. CT of head shows no acute infarct. Given history, patient most likely has suffered a right internal capsule/thalamic infarct which is resolving.  Stroke Risk Factors - none  Recommend  1. HgbA1c,  fasting lipid panel 2. MRI, MRA  of the brain without contrast--patient does have a glucose monitor that is on her arm that will need to be taken off prior to MRI. 3. PT consult, OT consult, Speech consult 4. Echocardiogram 5. Carotid dopplers 6. Prophylactic therapy-Antiplatelet med: Aspirin - dose 81 mg daily if okay with patient's endocrinologist due to currently having a glucose monitor on her arm. 7. Risk factor modification 8. Telemetry monitoring 9. Frequent neuro checks 10 NPO until passes stroke swallow screen 11 please page stroke NP  Or  PA  Or MD from 8am -4 pm  as this patient from this time will be  followed by the stroke.   You can look them up on [email or web address]  Password [email or web address]  I have reviewed the above note and I am in full agreement with history assessment and plan.  I have seen and examined the patient.  She will need to be started on daily antiplatelet therapy.  Allow for permissive hypertension until tomorrow, or until after the carotid doppler and MRI/MRA are completed to ensure absence of flow dependence.  Will need pharamacological DVT prophylaxis and PT/OT.  Stroke team will continue to follow this patient.

## 2016-10-11 NOTE — ED Triage Notes (Signed)
Per EMS: pt from home woke up with left sided weakness this am that is improving; pt sts some HA this am

## 2016-10-11 NOTE — H&P (Signed)
[facility] Admission History and Physical Service Pager: [phone]  Patient name: Wendy Mcclain Medical record number: [id number] Date of birth: Oct 14, 1950 Age: 66 y.o. Gender: female  Primary Care Provider: Ronnie Doss, DO Consultants: Neurology  Code Status: FULL   Chief Complaint:  L-sided weakness, chest pain   Assessment and Plan: ACASIA Wendy Mcclain is a 65 y.o. female presenting with L-sided weakness that began this AM. PMH is significant for uncontrolled T2DM, asthma, GERD, allergic rhinitis and elevated blood pressures without a diagnosis of HTN.    L-sided weakness, r/o CVA.  Patient reports she woke up this morning with L-sided weakness and inability to move her leg.  Signs and symptoms concerning for stroke.  In ED, head CT negative for acute intracranial abnormality and neurology was consulted. No tPA given as no longer within window.  Symptoms have since improved upon arrival to ED however she does endorse some residual L leg weakness and mild left-sided frontal headache that began with onset of symptoms this morning. Headache is dull.  No history of prior stroke.  VSS, and patient afebrile with no white count.  Labs significant for K+ 3.4, blood glucose elevated to 142, and otherwise unremarkable.  -Admit to telemetry, attending Dr. Ree Kida  -Neurology consulted -MRI/MRA without contrast - patient has a glucose monitor on L arm that will need to be removed prior to MRI -Echocardiogram -Carotid Dopplers -Telemetry monitoring -PT/OT/SLP eval and treat -NPO pending RN bedside swallow, RN to order diet if appropriate  -IVFs NS @ 75 cc.hr while NPO -Aspirin 81 mg daily  -Permissive HTN for 24-48 hours: can add Hydralazine prn with parameters if needed -lipid panel, TSH, Hgb A1c  -Tylenol 650 mg Q4 prn for headache  -CBC, BMET  -Fall precautions -Q2 hour neuro checks x12h then space to Q4 -SCDs in setting of stroke  -vitals per unit routine    Chest pain, typical.  Reports chest pain over last several weeks.  Symptoms consistent with stable angina as they worsen with exertion and associated with some shortness of breath.  This morning the episode lasted only several seconds and subsequently  resolved.   No associated nausea or diaphoresis. Initial troponin 0.00.   EKG with NSR and no new changes since prior.  She does have risk factors including reported elevated blood pressures with no diagnosis of HTN and maybe some "borderline higher cholesterol." Patient has negative exercise stress test from 2015. Differentials include CAD, PE, MSK etiology, and CHF.  Unlikely PE given her Well's score is 0.  No history of recent surgery, malignancy, long periods of travel or smoking.  Unlikely MSK as pain is not reproducible with palpation of chest wall.  CHF is less likely given no dyspnea on exam, no reported leg swelling and no signs of fluid overload on exam.   -trend troponins  -consider cardiology consult in AM  -AM EKG  -risk stratification labs as above  -if echo abnormal or MRI shows embolic pattern of infarct would consider d-dimer to evaluate for PE   T2DM, uncontrolled.  Most recent A1c 7.6% in 03/2014.  CBG on admission elevated to 142.  At home on Tresiba 32 U daily.   -Will hold home Tyler Aas in setting of her not having eaten today.  -sSSI  -Will resume 15 U Lantus (1/2 home dose) in the AM when no longer NPO  -monitor CBGs 4x daily before meals and at bedtime  Hypokalemia K+ 3.4 on admission.   -Will replete  with K-dur 40 mEq x2.  -Recheck BMET in AM  Allergic Rhinitis, stable.  At home on Claritin.  -Continue home Claritin 10 mg daily.    FEN/GI: NPO pending RN eval, IV NS @75cc .hr, Protonix  Prophylaxis: SCDs in setting of stroke   Disposition: Admit to inpatient telemetry, attending Dr. Ree Kida   History of Present Illness:  Wendy Mcclain is a 66 y.o. female presenting with new L-sided weakness.  Unable to move left  leg when she got up at 7:40 am. CBG 72 was this morning. Was essentially using right leg to ambulate. Ate some candy and CBG was 99. Eventually was able to move left leg but endorses some left leg weakness. Headache since this morning as well but has had increased frequency of headaches over the past few weeks. Thinks she might have had a tinge of a left facial droop. Potentially some confusion at the time. Speech has been normal. Doctor told her she needed to call the ambulance when she called their phone. EMS was called. Some on and off numbness/tingling of her left side. She does not have history of prior strokes. Shaking this morning she thought related to her low blood sugar and improved after eating some candy. Has also been having some stabbing chest pain/pressure in the midline over past several weeks. Has this pain occasionally. It gets worse with exertion. Gets SOB when walking up the stairs. Unsure if CP radiates. It is not constant.  She has not taken her medications this morning.  Has been having some visual changes since this past weekend.  Blurry vision is new for her.   Review Of Systems: Per HPI with the following additions: No abdominal pain. No lower extremity edema. No shortness of breath.   Review of Systems  Constitutional: Negative for diaphoresis, fever and weight loss.  HENT: Negative for congestion.   Eyes: Positive for blurred vision. Negative for pain.  Respiratory: Negative for cough, shortness of breath and wheezing.   Cardiovascular: Positive for chest pain. Negative for leg swelling.  Gastrointestinal: Negative for abdominal pain, nausea and vomiting.  Genitourinary: Negative for dysuria, frequency and urgency.  Neurological: Positive for weakness.   Patient Active Problem List   Diagnosis Date Noted  . Left-sided weakness 10/11/2016  . Orthostatic hypotension 09/18/2015  . Chest pain 08/15/2015  . Acute sinusitis 08/19/2014  . Yeast infection 08/19/2014  .  Vitamin D deficiency 05/20/2014  . Gastro-esophageal reflux 03/20/2014  . Allergic rhinitis 07/09/2013  . Sleep disorder 07/09/2013  . Elevated blood pressure (not hypertension) 07/09/2013  . Mood disorder ([place]) 11/23/2011  . DM (diabetes mellitus), type 2, uncontrolled ([place]) 11/23/2011  . Health care maintenance 11/23/2011   Past Medical History: Past Medical History:  Diagnosis Date  . Arrhythmia   . Asthma   . Diabetes mellitus   . GERD (gastroesophageal reflux disease)    Past Surgical History: Past Surgical History:  Procedure Laterality Date  . ABDOMINAL HYSTERECTOMY  1993  . APPENDECTOMY  1963  . BREAST ENHANCEMENT SURGERY  1986   Social History: Social History  Substance Use Topics  . Smoking status: Never Smoker  . Smokeless tobacco: Never Used  . Alcohol use No   Additional social history: Denies EtOH and drug use.  Lives by herself and has her grandsons with her sometimes. Completes all ADLs and IDLs by herself. 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  Please also refer to relevant sections of EMR.  Family History: Family History  Problem Relation  Age of Onset  . Aortic aneurysm Paternal Aunt     2 aunts died at age 8  . Alzheimer's disease Mother   . Cancer Father     esophageal  . Cancer Sister     stomach  . Cancer Paternal Grandfather     mouth & larynx  . Cancer Paternal Aunt     colon  . Heart disease    . Diabetes    . Hyperlipidemia    . Hypertension    . Stroke    . Thyroid disease Sister   . Colon cancer Neg Hx   . Hyperlipidemia Daughter   . Thyroid disease Daughter    Allergies and Medications: Allergies  Allergen Reactions  . Metformin And Related Diarrhea   No current facility-administered medications on file prior to encounter.    Current Outpatient Prescriptions on File Prior to Encounter  Medication Sig Dispense Refill  . albuterol (PROVENTIL HFA;VENTOLIN HFA) 108 (90 BASE) MCG/ACT inhaler Inhale 2 puffs into  the lungs every 6 (six) hours as needed for wheezing or shortness of breath. 1 Inhaler 1  . cetirizine (ZYRTEC) 10 MG tablet TAKE 1 TABLET BY MOUTH DAILY. 30 tablet 12  . Insulin Degludec (TRESIBA FLEXTOUCH) 100 UNIT/ML SOPN Inject 32 Units into the skin daily.    . Multiple Vitamin (MULTIVITAMIN) tablet Take 1 tablet by mouth daily.    Marland Kitchen omeprazole (PRILOSEC) 20 MG capsule Take 1 capsule (20 mg total) by mouth daily. 90 capsule 1  . guaiFENesin (MUCINEX) 600 MG 12 hr tablet Take 1 tablet (600 mg total) by mouth 2 (two) times daily. For thinning of secretions (Patient not taking: Reported on 10/11/2016) 30 tablet 0   Objective: BP 132/68   Pulse 76   Temp 97.7 F (36.5 C) (Oral)   Resp 17   Ht 5\' 3"  (1.6 m)   Wt 143 lb (64.9 kg)   SpO2 99%   BMI 25.33 kg/m  Exam: General: pleasant, alert, well appearing 66 yo female in hospital bed, in NAD  Eyes: EOMI, PERRL  HEENTM: NCAT, MMM, O/p clear Neck: supple, no JVD  Cardiovascular: RRR no MRG , palpable pulses Respiratory: CTA B/L , normal work of breathing on RA Gastrointestinal: soft, NT, ND, +bs  MSK: no edema or tenderness noted Derm: skin is warm and dry Neuro: AAOx3, CN II-XII intact, no facial droop noted, speech is normal with no slur, 5/5 strength in upper extremities bilaterally, strength 5/5 in RLE and 4/5 in LLE.  Reflexes 2+.  Some decreased sensation along left leg and left arm.   Unable to assess gait due to LLE weakness.  Psych: normal mood and affect   Labs and Imaging: CBC BMET   Recent Labs Lab 10/11/16 1222  WBC 5.5  HGB 13.4  HCT 39.6  PLT 241    Recent Labs Lab 10/11/16 1222  NA 138  K 3.4*  CL 107  CO2 22  BUN 7  CREATININE 0.81  GLUCOSE 142*  CALCIUM 9.0     Ct Head Wo Contrast  Result Date: 10/11/2016 CLINICAL DATA:  Left side weakness.  Headache EXAM: CT HEAD WITHOUT CONTRAST TECHNIQUE: Contiguous axial images were obtained from the base of the skull through the vertex without intravenous  contrast. COMPARISON:  09/18/2011 FINDINGS: Brain: No acute intracranial abnormality. Specifically, no hemorrhage, hydrocephalus, mass lesion, acute infarction, or significant intracranial injury. Vascular: No hyperdense vessel or unexpected calcification. Skull: No acute calvarial abnormality. Sinuses/Orbits: Visualized paranasal sinuses and mastoids clear.  Orbital soft tissues unremarkable. Other: None IMPRESSION: No intracranial abnormality. Electronically Signed   By: Rolm Baptise M.D.   On: 10/11/2016 12:46    Lovenia Kim, MD 10/11/2016, 6:21 PM PGY-1, [place] Intern pager: [phone], text pages welcome  Upper Level Addendum:  I have seen and evaluated this patient along with Dr. Reesa Chew and reviewed the above note, making necessary revisions in green.   Phill Myron, D.O. 10/11/2016, 7:44 PM PGY-2, [place]

## 2016-10-11 NOTE — ED Provider Notes (Signed)
[facility] DEPT Provider Note   CSN: [id number] Arrival date & time: 10/11/16  1147     History   Chief Complaint Chief Complaint  Patient presents with  . Weakness    HPI Wendy BLACKSHER is a 66 y.o. female.  HPI 66 year old Caucasian female past medical history significant for asthma, diabetes, GERD presents to the ED today with complaints of left lower Sherry weakness. Patient states that she woke up approximately 7 AM this morning and was unable to move her left lower leg. She states "it felt like a rag doll". Patient states she was able to use her upper body in her right leg to sit up in bed. She was able to stand on her right leg and lip to a chair. States that she thought that it would get better but the weakness persisted. She called her primary care doctor told her to come to the ED for evaluation. States that since this morning the weakness has improved. She still complains of mild weakness to the left lower extremity but has more mobility at this time. She denies any other extremity weakness including upper extremity is in right lower extremity. Does endorse having a mild left-sided frontal headache that started with the onset of her weakness symptoms this morning. Describes it as dull in nature. Denies sudden or maximal onset. Patient also endorses a "sharp tingly" sensation in inferior portion of there sternum this morning. States the episode lasted for a few seconds and self resolved. Denies any radiation. Not associated with exertion. Nonpleuritic in nature. Denies any nausea, emesis, diaphoresis with the episode. Denies any shortness of breath, lower extremity edema, calf tenderness. Denies any history of cardiac disease or PE. Denies any fever, chills, cough, dizziness, lightheadedness, abdominal pain, nausea, emesis, urinary symptoms, change in bowel habits. Past Medical History:  Diagnosis Date  . Arrhythmia   . Asthma   . Diabetes mellitus   . GERD (gastroesophageal  reflux disease)     Patient Active Problem List   Diagnosis Date Noted  . Orthostatic hypotension 09/18/2015  . Chest pain 08/15/2015  . Acute sinusitis 08/19/2014  . Yeast infection 08/19/2014  . Vitamin D deficiency 05/20/2014  . Gastro-esophageal reflux 03/20/2014  . Allergic rhinitis 07/09/2013  . Sleep disorder 07/09/2013  . Elevated blood pressure (not hypertension) 07/09/2013  . Mood disorder ([place]) 11/23/2011  . DM (diabetes mellitus), type 2, uncontrolled ([place]) 11/23/2011  . Health care maintenance 11/23/2011    Past Surgical History:  Procedure Laterality Date  . ABDOMINAL HYSTERECTOMY  1993  . APPENDECTOMY  1963  . BREAST ENHANCEMENT SURGERY  1986    OB History    No data available       Home Medications    Prior to Admission medications   Medication Sig Start Date End Date Taking? Authorizing Provider  albuterol (PROVENTIL HFA;VENTOLIN HFA) 108 (90 BASE) MCG/ACT inhaler Inhale 2 puffs into the lungs every 6 (six) hours as needed for wheezing or shortness of breath. 03/20/14   Ashly Windell Moulding, DO  cetirizine (ZYRTEC) 10 MG tablet TAKE 1 TABLET BY MOUTH DAILY. 06/09/16   Ashly Windell Moulding, DO  guaiFENesin (MUCINEX) 600 MG 12 hr tablet Take 1 tablet (600 mg total) by mouth 2 (two) times daily. For thinning of secretions 08/15/15   Archie Patten, MD  Insulin Degludec (TRESIBA FLEXTOUCH) 100 UNIT/ML SOPN Inject 32 Units into the skin daily.    Historical Provider, MD  Multiple Vitamin (MULTIVITAMIN) tablet Take 1 tablet by mouth  daily.    Historical Provider, MD  omeprazole (PRILOSEC) 20 MG capsule Take 1 capsule (20 mg total) by mouth daily. 08/15/15   Archie Patten, MD    Family History Family History  Problem Relation Age of Onset  . Aortic aneurysm Paternal Aunt     2 aunts died at age 46  . Alzheimer's disease Mother   . Cancer Father     esophageal  . Cancer Sister     stomach  . Cancer Paternal Grandfather     mouth & larynx  . Cancer  Paternal Aunt     colon  . Heart disease    . Diabetes    . Hyperlipidemia    . Hypertension    . Stroke    . Thyroid disease Sister   . Colon cancer Neg Hx   . Hyperlipidemia Daughter   . Thyroid disease Daughter     Social History Social History  Substance Use Topics  . Smoking status: Never Smoker  . Smokeless tobacco: Never Used  . Alcohol use No     Allergies   Metformin and related   Review of Systems Review of Systems  Constitutional: Negative for chills and fever.  HENT: Negative for congestion.   Eyes: Negative for visual disturbance.  Respiratory: Negative for cough and shortness of breath.   Cardiovascular: Positive for chest pain. Negative for palpitations and leg swelling.  Gastrointestinal: Negative for abdominal pain, diarrhea, nausea and vomiting.  Genitourinary: Negative for dysuria, frequency, hematuria and urgency.  Musculoskeletal: Negative for back pain.  Skin: Negative.   Neurological: Positive for weakness, numbness and headaches. Negative for dizziness, syncope and light-headedness.  All other systems reviewed and are negative.    Physical Exam Updated Vital Signs BP 143/60 (BP Location: Right Arm)   Pulse 72   Temp 97.7 F (36.5 C) (Oral)   Resp 18   Ht 5\' 3"  (1.6 m)   Wt 64.9 kg   SpO2 99%   BMI 25.33 kg/m   Physical Exam  Constitutional: She is oriented to person, place, and time. She appears well-developed and well-nourished. No distress.  HENT:  Head: Normocephalic and atraumatic.  Mouth/Throat: Oropharynx is clear and moist.  Uvula midline. Oropharynx is clear.  Eyes: Conjunctivae and EOM are normal. Pupils are equal, round, and reactive to light. Right eye exhibits no discharge. Left eye exhibits no discharge. No scleral icterus.  Neck: Normal range of motion. Neck supple. No thyromegaly present.  Cardiovascular: Normal rate, regular rhythm, normal heart sounds and intact distal pulses.  Exam reveals no gallop and no  friction rub.   No murmur heard. Pulmonary/Chest: Effort normal and breath sounds normal. No respiratory distress. She has no wheezes. She has no rales. She exhibits no tenderness.  Abdominal: Soft. Bowel sounds are normal. She exhibits no distension. There is no tenderness. There is no rebound and no guarding.  Musculoskeletal: Normal range of motion.  No midline tenderness. Negative foreign step-offs noted.  Lymphadenopathy:    She has no cervical adenopathy.  Neurological: She is alert and oriented to person, place, and time.  Strength 5 out of 5 in upper extremities and right lower extremity. Strength 4-5 in left lower extremity. Reflexes are 2+ and upper extremities and right lower extremity. 1+ in left lower extremity. Sensation intact sharp/dull. DP pulses are 2+ bilaterally Refill normal.  The patient is alert, attentive, and oriented x 3. Speech is clear. Cranial nerve II-VII grossly intact. Negative pronator drift. Sensation intact. Rapid  alternating movement and fine finger movements intact. Did not assess Romberg and gait due to left lower side weakness. Normal tone.  Skin: Skin is warm and dry. Capillary refill takes less than 2 seconds.  Nursing note and vitals reviewed.    ED Treatments / Results  Labs (all labs ordered are listed, but only abnormal results are displayed) Labs Reviewed  BASIC METABOLIC PANEL - Abnormal; Notable for the following:       Result Value   Potassium 3.4 (*)    Glucose, Bld 142 (*)    All other components within normal limits  CBC WITH DIFFERENTIAL/PLATELET  Randolm Idol, ED    EKG  EKG Interpretation  Date/Time:  Monday October 11 2016 12:12:34 EDT Ventricular Rate:  70 PR Interval:  198 QRS Duration: 80 QT Interval:  436 QTC Calculation: 470 R Axis:   1 Text Interpretation:  Normal sinus rhythm Low voltage QRS No significant change since last tracing Confirmed by Maryan Rued  MD, Loree Fee ([id number]) on 10/11/2016 1:18:23 PM        Radiology Ct Head Wo Contrast  Result Date: 10/11/2016 CLINICAL DATA:  Left side weakness.  Headache EXAM: CT HEAD WITHOUT CONTRAST TECHNIQUE: Contiguous axial images were obtained from the base of the skull through the vertex without intravenous contrast. COMPARISON:  09/18/2011 FINDINGS: Brain: No acute intracranial abnormality. Specifically, no hemorrhage, hydrocephalus, mass lesion, acute infarction, or significant intracranial injury. Vascular: No hyperdense vessel or unexpected calcification. Skull: No acute calvarial abnormality. Sinuses/Orbits: Visualized paranasal sinuses and mastoids clear. Orbital soft tissues unremarkable. Other: None IMPRESSION: No intracranial abnormality. Electronically Signed   By: Rolm Baptise M.D.   On: 10/11/2016 12:46    Procedures Procedures (including critical care time)  Medications Ordered in ED Medications  loratadine (CLARITIN) tablet 10 mg (10 mg Oral Given 10/12/16 0849)  pantoprazole (PROTONIX) EC tablet 40 mg (40 mg Oral Given 10/12/16 0850)  acetaminophen (TYLENOL) tablet 650 mg (not administered)    Or  acetaminophen (TYLENOL) solution 650 mg (not administered)    Or  acetaminophen (TYLENOL) suppository 650 mg (not administered)  senna-docusate (Senokot-S) tablet 1 tablet (not administered)  0.9 %  sodium chloride infusion ( Intravenous Stopped 10/12/16 0849)  insulin aspart (novoLOG) injection 0-9 Units (2 Units Subcutaneous Given 10/12/16 0658)  enoxaparin (LOVENOX) injection 40 mg (40 mg Subcutaneous Given 10/11/16 2216)  aspirin EC tablet 325 mg (325 mg Oral Given 10/12/16 0849)  insulin glargine (LANTUS) injection 15 Units (not administered)  atorvastatin (LIPITOR) tablet 40 mg (not administered)   stroke: mapping our early stages of recovery book ( Does not apply Given 10/12/16 0015)  potassium chloride SA (K-DUR,KLOR-CON) CR tablet 40 mEq (40 mEq Oral Not Given 10/12/16 0851)     Initial Impression / Assessment and Plan / ED Course   I have reviewed the triage vital signs and the nursing notes.  Pertinent labs & imaging results that were available during my care of the patient were reviewed by me and considered in my medical decision making (see chart for details).     Pt presents to the ED with complains of LLE weakness when waking up this AM, dull frontal ha, and mild cp. No history of stroke. She is outside the window for tPA. She does have mild residual weakness of the LLE. No other focal deficits. Weakness has improved and pt is able to ambulate. CT scan showed no acute abnormalities. EKG with no acute changes. Troponin negative. CP deferential include PE, ACS, CHF. Less likely  PE given low risk wells. Clinical presentation is not concerning for ACS or CHF. Pain is not reproducible. Tropoin will need to be trended with further workup. Spoke with Dr. Wendee Beavers with neurology who will come to the ED to evaluate pt. Spoke with DR. Vega in person who will consult on pt but want to admit to medicine. Recommended ordering MRI. Spoke with Dr Juleen China with family medicine who will come to the ED to admit pt and place admission orders. Pt was seen and evaluated by Dr. Maryan Rued who is agreeable to the above plan.Pt is hemodynamically stable an in NAD. LLE weakness is improving. Updated on plan of care.   Final Clinical Impressions(s) / ED Diagnoses   Final diagnoses:  Left leg weakness    New Prescriptions Current Discharge Medication List       Doristine Devoid, PA-C 10/12/16 1152    Blanchie Dessert, MD 10/12/16 2142

## 2016-10-11 NOTE — ED Notes (Signed)
Patient transported to CT 

## 2016-10-12 ENCOUNTER — Encounter (HOSPITAL_COMMUNITY): Payer: Self-pay | Admitting: *Deleted

## 2016-10-12 ENCOUNTER — Inpatient Hospital Stay (HOSPITAL_COMMUNITY): Payer: Medicare Other

## 2016-10-12 ENCOUNTER — Ambulatory Visit: Payer: Medicare Other | Admitting: *Deleted

## 2016-10-12 DIAGNOSIS — E118 Type 2 diabetes mellitus with unspecified complications: Secondary | ICD-10-CM

## 2016-10-12 DIAGNOSIS — G4733 Obstructive sleep apnea (adult) (pediatric): Secondary | ICD-10-CM

## 2016-10-12 DIAGNOSIS — R29898 Other symptoms and signs involving the musculoskeletal system: Secondary | ICD-10-CM

## 2016-10-12 DIAGNOSIS — R079 Chest pain, unspecified: Secondary | ICD-10-CM

## 2016-10-12 DIAGNOSIS — Z794 Long term (current) use of insulin: Secondary | ICD-10-CM

## 2016-10-12 DIAGNOSIS — I6789 Other cerebrovascular disease: Secondary | ICD-10-CM

## 2016-10-12 DIAGNOSIS — I639 Cerebral infarction, unspecified: Secondary | ICD-10-CM

## 2016-10-12 DIAGNOSIS — E1159 Type 2 diabetes mellitus with other circulatory complications: Secondary | ICD-10-CM

## 2016-10-12 DIAGNOSIS — E785 Hyperlipidemia, unspecified: Secondary | ICD-10-CM

## 2016-10-12 LAB — GLUCOSE, CAPILLARY
GLUCOSE-CAPILLARY: 169 mg/dL — AB (ref 65–99)
GLUCOSE-CAPILLARY: 167 mg/dL — AB (ref 65–99)
GLUCOSE-CAPILLARY: 170 mg/dL — AB (ref 65–99)
Glucose-Capillary: 165 mg/dL — ABNORMAL HIGH (ref 65–99)

## 2016-10-12 LAB — BASIC METABOLIC PANEL
Anion gap: 6 (ref 5–15)
BUN: 7 mg/dL (ref 6–20)
CALCIUM: 8.4 mg/dL — AB (ref 8.9–10.3)
CHLORIDE: 109 mmol/L (ref 101–111)
CO2: 23 mmol/L (ref 22–32)
CREATININE: 0.77 mg/dL (ref 0.44–1.00)
GFR calc non Af Amer: >60 mL/min (ref >60)
GLUCOSE: 226 mg/dL — AB (ref 65–99)
Potassium: 4 mmol/L (ref 3.5–5.1)
Sodium: 138 mmol/L (ref 135–145)

## 2016-10-12 LAB — LIPID PANEL
CHOL/HDL RATIO: 3.6 ratio
Cholesterol: 164 mg/dL (ref 0–200)
HDL: 45 mg/dL (ref >40)
LDL Cholesterol: 79 mg/dL (ref 0–99)
Triglycerides: 201 mg/dL — ABNORMAL HIGH (ref <150)
VLDL: 40 mg/dL (ref 0–40)

## 2016-10-12 LAB — ECHOCARDIOGRAM COMPLETE
Height: 63 in
Weight: 2288 oz

## 2016-10-12 LAB — VAS US CAROTID
LCCAPDIAS: 23 cm/s
LCCAPSYS: 75 cm/s
LEFT ECA DIAS: -7 cm/s
LEFT VERTEBRAL DIAS: -25 cm/s
LICAPDIAS: -18 cm/s
LICAPSYS: -51 cm/s
Left CCA dist dias: -25 cm/s
Left CCA dist sys: -83 cm/s
Left ICA dist dias: -24 cm/s
Left ICA dist sys: -60 cm/s
RIGHT ECA DIAS: -12 cm/s
RIGHT VERTEBRAL DIAS: -16 cm/s
Right CCA prox dias: -15 cm/s
Right CCA prox sys: -67 cm/s
Right cca dist sys: 104 cm/s

## 2016-10-12 LAB — CBC
HEMATOCRIT: 37.4 % (ref 36.0–46.0)
HEMOGLOBIN: 12.3 g/dL (ref 12.0–15.0)
MCH: 29.8 pg (ref 26.0–34.0)
MCHC: 32.9 g/dL (ref 30.0–36.0)
MCV: 90.6 fL (ref 78.0–100.0)
Platelets: 233 10*3/uL (ref 150–400)
RBC: 4.13 MIL/uL (ref 3.87–5.11)
RDW: 13.2 % (ref 11.5–15.5)
WBC: 5.1 10*3/uL (ref 4.0–10.5)

## 2016-10-12 LAB — TROPONIN I: Troponin I: <0.03 ng/mL (ref <0.03)

## 2016-10-12 MED ORDER — INSULIN GLARGINE 100 UNIT/ML ~~LOC~~ SOLN
15.0000 [IU] | Freq: Every day | SUBCUTANEOUS | Status: DC
  Administered 2016-10-12 – 2016-10-13 (×2): 15 [IU] via SUBCUTANEOUS
  Filled 2016-10-12 (×2): qty 0.15

## 2016-10-12 MED ORDER — ASPIRIN EC 325 MG PO TBEC
325.0000 mg | DELAYED_RELEASE_TABLET | Freq: Every day | ORAL | Status: DC
  Administered 2016-10-12 – 2016-10-13 (×2): 325 mg via ORAL
  Filled 2016-10-12 (×2): qty 1

## 2016-10-12 MED ORDER — ATORVASTATIN CALCIUM 40 MG PO TABS
40.0000 mg | ORAL_TABLET | Freq: Every day | ORAL | Status: DC
  Administered 2016-10-12: 40 mg via ORAL
  Filled 2016-10-12: qty 1

## 2016-10-12 MED ORDER — ATORVASTATIN CALCIUM 10 MG PO TABS: 20.0000 mg | ORAL_TABLET | Freq: Every day | ORAL | Status: DC

## 2016-10-12 NOTE — Progress Notes (Signed)
Family Medicine Teaching Service Daily Progress Note Intern Pager: [phone]  Patient name: Wendy Mcclain Medical record number: [id number] Date of birth: 06-May-1951 Age: 66 y.o. Gender: female  Primary Care Provider: Ronnie Doss, DO Consultants: Neurology  Code Status: FULL   Pt Overview and Major Events to Date:  Admit 3/12  Assessment and Plan: Wendy Mcclain is a 66 y.o. female presenting with L-sided weakness that began this AM. PMH is significant for uncontrolled T2DM, asthma, GERD, allergic rhinitis and elevated blood pressures without a diagnosis of HTN.    L-sided weakness, r/o CVA.  In ED, head CT negative for acute intracranial abnormality and neurology was consulted. No tPA given as no longer within window.  Symptoms have since improved upon arrival to ED however she does endorse some residual L leg weakness and mild left-sided frontal headache that began with onset of symptoms this morning. Headache is dull.  No history of prior stroke.  VSS, and patient afebrile with no white count.  Labs significant for K+ 3.4, blood glucose elevated to 142, and otherwise unremarkable. MRI w/o contrast revealed acute right caudate lacunar infarct.  MRA normal. Passed RN bedside swallow, Carb modified diet.  Discontinued IVFs as she is tolerating po.  -Neurology following, appreciate recs  -Echocardiogram pending  -Carotid Dopplers pending  -Telemetry monitoring  -PT/OT/SLP eval and treat   -Continue Aspirin 81 mg daily  -Started on a daily statin medication - Lipitor 40 mg  -Permissive HTN for 24-48 hours: can add Hydralazine prn with parameters if needed.  Blood pressures have been stable overnight.   -Risk start labs --> lipid panel wnl except TG high at 201, TSH normal, Hgb A1c pending  -Tylenol 650 mg Q4 prn for headache  -Fall precautions -Q4 neuro checks  -vitals per unit routine   Chest pain, typical.  Symptoms consistent with stable angina as they worsen with exertion and  associated with some shortness of breath. Initial troponin 0.00.   EKG with NSR and no new changes since prior.  She does have risk factors including reported elevated blood pressures with no diagnosis of HTN and maybe some "borderline higher cholesterol." Patient has negative exercise stress test from 2015.  -trend troponins (neg x2)  -Will consider cards consult pending result of echo.  May want to perform stress test outpatient.  -AM EKG -Sinus rhythm with 1st degree AV block  -risk stratification labs as above  -if echo abnormal or MRI shows embolic pattern of infarct would consider d-dimer to evaluate for PE   T2DM, uncontrolled.  Most recent A1c 7.6% in 03/2014.  CBG on admission elevated to 142.  At home on Tresiba 32 U daily.   -Will hold home Tyler Aas in setting of her not having eaten today. CBGs overnight elevated to 237>165.   -sSSI  -Resumed 15 U Lantus (1/2 home dose) this AM as she is no longer NPO -monitor CBGs 4x daily before meals and at bedtime   Hypokalemia K+ 4.0 this AM   -Daily BMET  Allergic Rhinitis, stable.  At home on Claritin.  -Continue home Claritin 10 mg daily.    FEN/GI: Carb modified, KVO, Protonix  Prophylaxis: Lovenox   Disposition: Continue to monitor on inpatient for stroke workup.  Dispo pending clinical improvement.   Subjective:  Patient feels well and her left leg symptoms have almost completely resolved. She denies any new symptoms.  No concerns at this time.   Objective: Temp:  [97.3 F (36.3 C)-98.2 F (36.8 C)] 97.7  F (36.5 C) (03/13 1338) Pulse Rate:  [67-84] 84 (03/13 1338) Resp:  [9-21] 18 (03/13 1338) BP: (99-147)/(46-130) 142/66 (03/13 1338) SpO2:  [95 %-100 %] 100 % (03/13 1338) Weight:  [143 lb (64.9 kg)] 143 lb (64.9 kg) (03/12 2020)   Physical Exam:   General: pleasant, alert, in NAD  Eyes: EOMI, PERRL  HEENTM: NCAT, MMM Neck: supple, no JVD  Cardiovascular: RRR no MRG Respiratory: CTA B/L , normal work of  breathing on RA Gastrointestinal: soft, NT, ND, +bs  MSK: no edema or tenderness noted Derm: skin is warm and dry Neuro: AAOx3, CN II-XII intact, no facial droop noted, speech normal with no slur, 5/5 strength in upper extremities bilaterally, strength 5/5 in RLE and 4/5 in LLE.   Psych: normal mood and affect   Laboratory:  Recent Labs Lab 10/11/16 1222 10/12/16 0213  WBC 5.5 5.1  HGB 13.4 12.3  HCT 39.6 37.4  PLT 241 233    Recent Labs Lab 10/11/16 1222 10/12/16 0213  NA 138 138  K 3.4* 4.0  CL 107 109  CO2 22 23  BUN 7 7  CREATININE 0.81 0.77  CALCIUM 9.0 8.4*  GLUCOSE 142* 226*   Imaging/Diagnostic Tests: Mr Brain Wo Contrast  Result Date: 10/12/2016 CLINICAL DATA:  LEFT-sided weakness beginning this morning. History of diabetes. EXAM: MRI HEAD WITHOUT CONTRAST MRA HEAD WITHOUT CONTRAST TECHNIQUE: Multiplanar, multiecho pulse sequences of the brain and surrounding structures were obtained without intravenous contrast. Angiographic images of the head were obtained using MRA technique without contrast. COMPARISON:  CT HEAD October 11, 2016 at 1244 hours FINDINGS: MRI HEAD FINDINGS BRAIN: 9 mm faint reduced diffusion RIGHT caudate head with low ADC values, minimal T2 hyperintense signal. No susceptibility artifact to suggest hemorrhage. The ventricles and sulci are normal for patient's age. No suspicious parenchymal signal, masses or mass effect. No abnormal extra-axial fluid collections. VASCULAR: Normal major intracranial vascular flow voids present at skull base. SKULL AND UPPER CERVICAL SPINE: No abnormal sellar expansion. No suspicious calvarial bone marrow signal. Craniocervical junction maintained. SINUSES/ORBITS: The mastoid air-cells and included paranasal sinuses are well-aerated. The included ocular globes and orbital contents are non-suspicious. Status post bilateral ocular lens implants. OTHER: None. MRA HEAD FINDINGS ANTERIOR CIRCULATION: Normal flow related  enhancement of the included cervical, petrous, cavernous and supraclinoid internal carotid arteries. Patent anterior communicating artery. Normal flow related enhancement of the anterior and middle cerebral arteries, including distal segments. No large vessel occlusion, high-grade stenosis, abnormal luminal irregularity, aneurysm. POSTERIOR CIRCULATION: LEFT vertebral artery is dominant. Basilar artery is patent, with normal flow related enhancement of the main branch vessels. Normal flow related enhancement of the posterior cerebral arteries. No large vessel occlusion, high-grade stenosis, abnormal luminal irregularity, aneurysm. ANATOMIC VARIANTS: None. IMPRESSION: MRI HEAD: Acute RIGHT caudate lacunar infarct. Otherwise normal MRI head. MRA HEAD: Normal. Electronically Signed   By: Elon Alas M.D.   On: 10/12/2016 00:11   Mr Jodene Nam Head/brain ZO Cm  Result Date: 10/12/2016 CLINICAL DATA:  LEFT-sided weakness beginning this morning. History of diabetes. EXAM: MRI HEAD WITHOUT CONTRAST MRA HEAD WITHOUT CONTRAST TECHNIQUE: Multiplanar, multiecho pulse sequences of the brain and surrounding structures were obtained without intravenous contrast. Angiographic images of the head were obtained using MRA technique without contrast. COMPARISON:  CT HEAD October 11, 2016 at 1244 hours FINDINGS: MRI HEAD FINDINGS BRAIN: 9 mm faint reduced diffusion RIGHT caudate head with low ADC values, minimal T2 hyperintense signal. No susceptibility artifact to suggest hemorrhage. The ventricles and  sulci are normal for patient's age. No suspicious parenchymal signal, masses or mass effect. No abnormal extra-axial fluid collections. VASCULAR: Normal major intracranial vascular flow voids present at skull base. SKULL AND UPPER CERVICAL SPINE: No abnormal sellar expansion. No suspicious calvarial bone marrow signal. Craniocervical junction maintained. SINUSES/ORBITS: The mastoid air-cells and included paranasal sinuses are  well-aerated. The included ocular globes and orbital contents are non-suspicious. Status post bilateral ocular lens implants. OTHER: None. MRA HEAD FINDINGS ANTERIOR CIRCULATION: Normal flow related enhancement of the included cervical, petrous, cavernous and supraclinoid internal carotid arteries. Patent anterior communicating artery. Normal flow related enhancement of the anterior and middle cerebral arteries, including distal segments. No large vessel occlusion, high-grade stenosis, abnormal luminal irregularity, aneurysm. POSTERIOR CIRCULATION: LEFT vertebral artery is dominant. Basilar artery is patent, with normal flow related enhancement of the main branch vessels. Normal flow related enhancement of the posterior cerebral arteries. No large vessel occlusion, high-grade stenosis, abnormal luminal irregularity, aneurysm. ANATOMIC VARIANTS: None. IMPRESSION: MRI HEAD: Acute RIGHT caudate lacunar infarct. Otherwise normal MRI head. MRA HEAD: Normal. Electronically Signed   By: Elon Alas M.D.   On: 10/12/2016 00:11    Lovenia Kim, MD 10/12/2016, 4:11 PM PGY-1, [place] Intern pager: [phone], text pages welcome

## 2016-10-12 NOTE — Evaluation (Signed)
Physical Therapy Evaluation & Discharge Patient Details Name: Wendy Mcclain MRN: [id number] DOB: 1950/11/02 Today's Date: 10/12/2016   History of Present Illness  Wendy Mcclain is a 66 y.o. female presenting with L-sided weakness that began this AM. PMH is significant for uncontrolled T2DM, asthma, GERD, allergic rhinitis and elevated blood pressures without a diagnosis of HTN.  MRI positive for Acute RIGHT caudate lacunar infarct  Clinical Impression  Patient presents with minor decreased coordination and strength L side.  Does not present with current functional limitations, however, so feel she can d/c home with intermittent family support without follow up PT at this time.  Did educate in home safety, risk factor modification and encouraged change in activity level only after clearance from MD.  No further skilled PT needs at this time.  Will sign off.     Follow Up Recommendations No PT follow up    Equipment Recommendations  None recommended by PT    Recommendations for Other Services       Precautions / Restrictions        Mobility  Bed Mobility Overal bed mobility: Modified Independent                Transfers Overall transfer level: Modified independent Equipment used: None                Ambulation/Gait Ambulation/Gait assistance: Independent Ambulation Distance (Feet): 150 Feet Assistive device: None Gait Pattern/deviations: Step-through pattern;Decreased stride length     General Gait Details: slower speed, no LOB, min cues for L arm swing, see DGI  Stairs Stairs: Yes Stairs assistance: Supervision Stair Management: One rail Right;Alternating pattern;Step to pattern;Forwards Number of Stairs: 10 General stair comments: Step through for ascend, step to versus step through to descend, S for safety  Wheelchair Mobility    Modified Rankin (Stroke Patients Only) Modified Rankin (Stroke Patients Only) Pre-Morbid Rankin Score: No  symptoms Modified Rankin: Slight disability     Balance Overall balance assessment: Needs assistance   Sitting balance-Leahy Scale: Normal       Standing balance-Leahy Scale: Good                   Standardized Balance Assessment Standardized Balance Assessment : Dynamic Gait Index   Dynamic Gait Index Level Surface: Mild Impairment Change in Gait Speed: Mild Impairment Gait with Horizontal Head Turns: Normal Gait with Vertical Head Turns: Normal Gait and Pivot Turn: Normal Step Over Obstacle: Normal Step Around Obstacles: Normal Steps: Mild Impairment Total Score: 21       Pertinent Vitals/Pain Pain Assessment: No/denies pain    Home Living Family/patient expects to be discharged to:: Private residence Living Arrangements: Alone Available Help at Discharge: Family;Available PRN/intermittently Type of Home: House Home Access: Stairs to enter;Ramped entrance Entrance Stairs-Rails: Can reach both;Left;Right Entrance Stairs-Number of Steps: 4 in front and ramp in back Home Layout: Two level (one and a half story) Home Equipment: Grab bars - tub/shower Additional Comments: daughter lives close but works a lot, sister is Radiation protection practitioner as well    Prior Function Level of Independence: Independent         Comments: works some as hairdresser     Journalist, newspaper        Extremity/Trunk Assessment   Upper Extremity Assessment Upper Extremity Assessment: Overall WFL for tasks assessed (but holds L arm flexed and stiff during ambulation)    Lower Extremity Assessment Lower Extremity Assessment: LLE deficits/detail LLE Deficits / Details: AROM WFL, strength hip  flexion 3/5, knee extension 4-/5, ankle DF 4+/5, decreased coordination with toe tapping LLE Sensation: decreased light touch       Communication   Communication: No difficulties  Cognition Arousal/Alertness: Awake/alert Behavior During Therapy: WFL for tasks assessed/performed Overall Cognitive  Status: Within Functional Limits for tasks assessed                      General Comments General comments (skin integrity, edema, etc.): Educated on gradual return to activity, fall prevention and risk factor modification for stroke prevention    Exercises     Assessment/Plan    PT Assessment Patent does not need any further PT services  PT Problem List         PT Treatment Interventions      PT Goals (Current goals can be found in the Care Plan section)  Acute Rehab PT Goals PT Goal Formulation: All assessment and education complete, DC therapy    Frequency     Barriers to discharge        Co-evaluation               End of Session Equipment Utilized During Treatment: Gait belt Activity Tolerance: Patient tolerated treatment well Patient left: in bed;with call bell/phone within reach   PT Visit Diagnosis: Other abnormalities of gait and mobility (R26.89)         Time: 2449-7530 PT Time Calculation (min) (ACUTE ONLY): 30 min   Charges:   PT Evaluation $PT Eval Moderate Complexity: 1 Procedure PT Treatments $Self Care/Home Management: 8-22   PT G Codes:         Reginia Naas 2016/10/30, 10:28 AM  Magda Kiel, [place] 2016-10-30

## 2016-10-12 NOTE — Progress Notes (Signed)
Echocardiogram 2D Echocardiogram has been performed.  Aggie Cosier 10/12/2016, 1:00 PM

## 2016-10-12 NOTE — Care Management Note (Signed)
Case Management Note  Patient Details  Name: DAMONI ERKER MRN: [id number] Date of Birth: 13-Feb-1951  Subjective/Objective:                  Patient was admitted with CVA. Lives at home alone. CM will follow for discharge needs pending therapy evals and physician orders.   Action/Plan:   Expected Discharge Date:  10/14/16               Expected Discharge Plan:     In-House Referral:     Discharge planning Services     Post Acute Care Choice:    Choice offered to:     DME Arranged:    DME Agency:     HH Arranged:    HH Agency:     Status of Service:     If discussed at [employer], dates discussed:    Additional Comments:  Rolm Baptise, RN 10/12/2016, 10:12 AM

## 2016-10-12 NOTE — Progress Notes (Signed)
**  Preliminary report by tech**   Carotid artery duplex complete. Findings are consistent with a 1-39 percent stenosis involving the right internal carotid artery and the left internal carotid artery. The vertebral arteries demonstrate antegrade flow.  10/12/16 12:31 PM Wendy Mcclain RVT

## 2016-10-12 NOTE — Progress Notes (Signed)
STROKE TEAM PROGRESS NOTE   HISTORY OF PRESENT ILLNESS (per record) Wendy Mcclain is an 66 y.o. female who woke up approximately 7 AM this morning and was unable to move her left lower leg (LKW 10/10/2016 at 2000). She states "it felt like a rag doll". Patient states she was able to use her upper body in her right leg to sit up in bed. She was able to stand on her right leg and lip to a chair. States that she thought that it would get better but the weakness persisted. She called her primary care doctor told her to come to the ED for evaluation. States that since this morning the weakness has improved. She still complains of mild weakness to the left lower extremity but has more mobility at this time. She denies any other extremity weakness including upper extremity is in right lower extremity. Does endorse having a mild left-sided frontal headache that started with the onset of her weakness symptoms this morning. Describes it as dull in nature.   Currently patient is not complaining of any headache. She feels her left leg has improved significantly. Her main complaint at this time is minor decreased sensation in her left leg left arm and the very lateral aspect of her V1 through V3 aspect on the left side. Patient denies having any visual or verbal problems. Patient does not smoke, patient does take aspirin but only 3 times a week as per directed from her endocrinologist.  Patient was not administered IV t-PA secondary to due to resolving symptoms. She was admitted for further evaluation and treatment.   SUBJECTIVE (INTERVAL HISTORY) No family at bedside. Pt stated that her left leg weakness improved. She stated that she had some heart palpitation before, now still has it average once a week. She follows with Dr. Acie Fredrickson in cardiology. Had 2 week cardiac monitoring 10 years ago. Her glucose at home not in good control, follows with Dr. Lyda Kalata and undergoing glucose monitoring now. She stated that she had  s/s of OSA but not tested yet.   OBJECTIVE Temp:  [97.3 F (36.3 C)-98.2 F (36.8 C)] 97.5 F (36.4 C) (03/13 0731) Pulse Rate:  [67-77] 76 (03/13 0731) Cardiac Rhythm: Normal sinus rhythm (03/13 0022) Resp:  [9-21] 16 (03/13 0731) BP: (99-153)/(46-130) 103/75 (03/13 0731) SpO2:  [95 %-100 %] 99 % (03/13 0731) Weight:  [64.9 kg (143 lb)] 64.9 kg (143 lb) (03/12 2020)  CBC:  Recent Labs Lab 10/11/16 1222 10/12/16 0213  WBC 5.5 5.1  NEUTROABS 2.9  --   HGB 13.4 12.3  HCT 39.6 37.4  MCV 90.8 90.6  PLT 241 009    Basic Metabolic Panel:  Recent Labs Lab 10/11/16 1222 10/12/16 0213  NA 138 138  K 3.4* 4.0  CL 107 109  CO2 22 23  GLUCOSE 142* 226*  BUN 7 7  CREATININE 0.81 0.77  CALCIUM 9.0 8.4*    Lipid Panel:    Component Value Date/Time   CHOL 164 10/12/2016 0213   TRIG 201 (H) 10/12/2016 0213   HDL 45 10/12/2016 0213   CHOLHDL 3.6 10/12/2016 0213   VLDL 40 10/12/2016 0213   LDLCALC 79 10/12/2016 0213   HgbA1c:  Lab Results  Component Value Date   HGBA1C 7.6 03/20/2014   Urine Drug Screen:    Component Value Date/Time   LABOPIA NONE DETECTED 10/11/2016 2121   COCAINSCRNUR NONE DETECTED 10/11/2016 2121   LABBENZ NONE DETECTED 10/11/2016 2121   AMPHETMU NONE DETECTED 10/11/2016  2121   THCU NONE DETECTED 10/11/2016 2121   LABBARB NONE DETECTED 10/11/2016 2121      IMAGING I have personally reviewed the radiological images below and agree with the radiology interpretations.  Ct Head Wo Contrast 10/11/2016 No intracranial abnormality.   Mr Brain Wo Contrast 10/12/2016 Acute RIGHT caudate lacunar infarct. Otherwise normal MRI head.   Mr Jodene Nam Head/brain Wo Cm 10/12/2016 Normal.   CUS - Bilateral: 1-39% ICA stenosis. Vertebral artery flow is antegrade.  TTE - Left ventricle: The cavity size was normal. Wall thickness was   normal. Systolic function was normal. The estimated ejection   fraction was in the range of 55% to 60%. Wall motion was  normal;   there were no regional wall motion abnormalities. Doppler   parameters are consistent with abnormal left ventricular   relaxation (grade 1 diastolic dysfunction). Doppler parameters   are consistent with high ventricular filling pressure. Impressions: - Normal LV systolic function; grade 1 diastolic dysfunction with   elevated LV filling pressure.   PHYSICAL EXAM Physical exam  Temp:  [97.3 F (36.3 C)-98.2 F (36.8 C)] 97.7 F (36.5 C) (03/13 1338) Pulse Rate:  [67-84] 84 (03/13 1338) Resp:  [9-21] 18 (03/13 1338) BP: (99-147)/(46-130) 142/66 (03/13 1338) SpO2:  [95 %-100 %] 100 % (03/13 1338) Weight:  [143 lb (64.9 kg)] 143 lb (64.9 kg) (03/12 2020)  General - Well nourished, well developed, in no apparent distress.  Ophthalmologic - Sharp disc margins OU.   Cardiovascular - Regular rate and rhythm.  Mental Status -  Level of arousal and orientation to time, place, and person were intact. Language including expression, naming, repetition, comprehension was assessed and found intact. Attention span and concentration were normal. Fund of Knowledge was assessed and was intact.  Cranial Nerves II - XII - II - Visual field intact OU. III, IV, VI - Extraocular movements intact. V - Facial sensation intact bilaterally. VII - Facial movement intact bilaterally. VIII - Hearing & vestibular intact bilaterally. X - Palate elevates symmetrically. XI - Chin turning & shoulder shrug intact bilaterally. XII - Tongue protrusion intact.  Motor Strength - The patient's strength was normal in all extremities except LLE mild give away weakness and pronator drift was absent.  Bulk was normal and fasciculations were absent.   Motor Tone - Muscle tone was assessed at the neck and appendages and was normal.  Reflexes - The patient's reflexes were 1+ in all extremities and she had no pathological reflexes.  Sensory - Light touch, temperature/pinprick were assessed and were  symmetrical.    Coordination - The patient had normal movements in the hands and feet with no ataxia or dysmetria.  Tremor was absent.  Gait and Station - deferred.   ASSESSMENT/PLAN [name] is a 66 y.o. female with history of uncontrolled DM, asthma, GERD, allergic rhinitis and elevated BP presenting with L leg weakness. She did not receive IV t-PA due to resolving symptoms.   Stroke:  right caudate lacunar infarct secondary to small vessel disease source  Resultant  LLE giveaway weakness  MRI  R caudate lacunar infarct  MRA  Unremarkable   Carotid Doppler  unremarkable  2D Echo  EF 55-60%  LDL 79  HgbA1c pending  Lovenox 40 mg sq daily for VTE prophylaxis  Diet Carb Modified Fluid consistency: Thin; Room service appropriate? Yes  No antithrombotic prior to admission, now on aspirin 325 mg daily. Continue ASA on discharge.  Patient counseled to be compliant with  her antithrombotic medications  Ongoing aggressive stroke risk factor management  Therapy recommendations:  pending   Disposition:  pending   Blood Pressure  Stable  Permissive hypertension (OK if <220/120) for 24-48 hours post stroke and then gradually normalized within 5-7 days.  BP goal long term normotensive   Hyperlipidemia  Home meds:  No statin  LDL 79, goal < 70  Now on lipitor 20 mg daily  Continue statin at discharge  Diabetes  HgbA1c pending, goal < 7.0  Glucose not in control at home  SSI  CBG monitoring  Close follow up with endocrinologist  Heart palpitation  Follows with Dr. Acie Fredrickson as outpt  Due to stroke with heart palpitation, would recommend to arrange 30 day cardiac event monitoring through Dr. Elmarie Shiley office to rule out afib  Other Stroke Risk Factors  Advanced age  S/s of OSA - need outpt sleep study - will arrange  Other Active Problems  Chest pain, typical  Hypokalemia, resolved after replacement  Allergic rhinitis  Hospital day #  1  Neurology will sign off. Please call with questions. Pt will follow up with Cecille Rubin NP at [facility] in about 6 weeks. Thanks for the consult.  Rosalin Hawking, MD PhD Stroke Neurology 10/12/2016 4:03 PM    To contact Stroke Continuity provider, please refer to [email or web address] After hours, contact General Neurology

## 2016-10-12 NOTE — Evaluation (Signed)
Occupational Therapy Evaluation Patient Details Name: MEKLIT COTTA MRN: [id number] DOB: 1951/04/28 Today's Date: 10/12/2016    History of Present Illness Arieonna DEVORAH GIVHAN is a 66 y.o. female presenting with L-sided weakness that began this AM. PMH is significant for uncontrolled T2DM, asthma, GERD, allergic rhinitis and elevated blood pressures without a diagnosis of HTN.  MRI positive for Acute RIGHT caudate lacunar infarct   Clinical Impression   PTA, pt was independent with ADL and functional mobility and was working as a Theme park manager part time. She does demonstrate slightly decreased fine motor coordination bilaterally and noted slight lag with L UE coordination testing but WFL. She is able to complete all ADL with modified independence in hospital setting. Educated pt on stroke symptoms and warning signs as well as safety post-acute D/C. She verbalizes understanding and reports no further questions/concerns. No further acute OT needs identified. Will sign off.    Follow Up Recommendations  No OT follow up;Supervision - Intermittent    Equipment Recommendations  None recommended by OT    Recommendations for Other Services       Precautions / Restrictions Restrictions Weight Bearing Restrictions: No      Mobility Bed Mobility Overal bed mobility: Modified Independent                Transfers Overall transfer level: Modified independent Equipment used: None                  Balance Overall balance assessment: Needs assistance Sitting-balance support: No upper extremity supported;Feet supported Sitting balance-Leahy Scale: Normal     Standing balance support: No upper extremity supported;During functional activity Standing balance-Leahy Scale: Good Standing balance comment: Completing dynamic standing tasks during ADL wihtout LOB with increased time.                 Standardized Balance Assessment Standardized Balance Assessment : Dynamic Gait Index   Dynamic Gait Index Level Surface: Mild Impairment Change in Gait Speed: Mild Impairment Gait with Horizontal Head Turns: Normal Gait with Vertical Head Turns: Normal Gait and Pivot Turn: Normal Step Over Obstacle: Normal Step Around Obstacles: Normal Steps: Mild Impairment Total Score: 21      ADL Overall ADL's : Modified independent                                       General ADL Comments: Able to complete all ADL in hospital setting without physical assistance. Increased time due to slight instability with ambulation.     Vision Baseline Vision/History: Wears glasses Wears Glasses: Reading only Patient Visual Report: No change from baseline Vision Assessment?: Yes Eye Alignment: Within Functional Limits Ocular Range of Motion: Within Functional Limits Alignment/Gaze Preference: Within Defined Limits Tracking/Visual Pursuits: Able to track stimulus in all quads without difficulty Saccades: Within functional limits Convergence: Within functional limits Visual Fields: No apparent deficits     Perception     Praxis Praxis Praxis tested?: Within functional limits    Pertinent Vitals/Pain Pain Assessment: No/denies pain     Hand Dominance Right   Extremity/Trunk Assessment Upper Extremity Assessment Upper Extremity Assessment: Overall WFL for tasks assessed;RUE deficits/detail;LUE deficits/detail RUE Coordination: decreased fine motor LUE Deficits / Details: Lag with coordination activities but [facility].  LUE Coordination: decreased fine motor   Lower Extremity Assessment Lower Extremity Assessment: LLE deficits/detail LLE Deficits / Details: AROM WFL, strength hip flexion 3/5, knee extension 4-/5,  ankle DF 4+/5, decreased coordination with toe tapping LLE Sensation: decreased light touch       Communication Communication Communication: No difficulties   Cognition Arousal/Alertness: Awake/alert Behavior During Therapy: WFL for tasks  assessed/performed Overall Cognitive Status: Within Functional Limits for tasks assessed                 General Comments: Demonstrated good problem solving ability.   General Comments  Educated on Be FAST stroke warning signs.    Exercises       Shoulder Instructions      Home Living Family/patient expects to be discharged to:: Private residence Living Arrangements: Alone Available Help at Discharge: Family;Available PRN/intermittently Type of Home: House Home Access: Stairs to enter;Ramped entrance Entrance Stairs-Number of Steps: 4 in front and ramp in back Entrance Stairs-Rails: Can reach both;Left;Right Home Layout: Two level (one and a half story) Alternate Level Stairs-Number of Steps: 13 Alternate Level Stairs-Rails: Right Bathroom Shower/Tub: Teacher, early years/pre: Standard     Home Equipment: Grab bars - tub/shower   Additional Comments: daughter lives close but works a lot, sister is Radiation protection practitioner as well      Prior Functioning/Environment Level of Independence: Independent        Comments: works some as hairdresser        OT Problem List: Decreased activity tolerance;Impaired balance (sitting and/or standing)      OT Treatment/Interventions:      OT Goals(Current goals can be found in the care plan section) Acute Rehab OT Goals Patient Stated Goal: for this not to happen again OT Goal Formulation: With patient Time For Goal Achievement: 10/26/16 Potential to Achieve Goals: Good  OT Frequency:     Barriers to D/C:            Co-evaluation              End of Session Nurse Communication: Mobility status  Activity Tolerance: Patient tolerated treatment well Patient left: in bed;with call bell/phone within reach (sitting at EOB; signed off as Independent by PT/OT)  OT Visit Diagnosis: Unsteadiness on feet (R26.81)                ADL either performed or assessed with clinical judgement  Time: 2395-3202 OT Time Calculation  (min): 18 min Charges:  OT General Charges $OT Visit: 1 Procedure OT Evaluation $OT Eval Low Complexity: 1 Procedure G-Codes:     Norman Herrlich, MS OTR/L  Pager: [place] A Isayah Ignasiak 10/12/2016, 10:57 AM

## 2016-10-13 ENCOUNTER — Other Ambulatory Visit: Payer: Self-pay | Admitting: Internal Medicine

## 2016-10-13 DIAGNOSIS — I63311 Cerebral infarction due to thrombosis of right middle cerebral artery: Secondary | ICD-10-CM

## 2016-10-13 DIAGNOSIS — E785 Hyperlipidemia, unspecified: Secondary | ICD-10-CM

## 2016-10-13 DIAGNOSIS — R531 Weakness: Secondary | ICD-10-CM

## 2016-10-13 LAB — BASIC METABOLIC PANEL
Anion gap: 5 (ref 5–15)
BUN: 6 mg/dL (ref 6–20)
CHLORIDE: 110 mmol/L (ref 101–111)
CO2: 22 mmol/L (ref 22–32)
Calcium: 8.7 mg/dL — ABNORMAL LOW (ref 8.9–10.3)
Creatinine, Ser: 0.85 mg/dL (ref 0.44–1.00)
GFR calc Af Amer: >60 mL/min (ref >60)
GLUCOSE: 139 mg/dL — AB (ref 65–99)
Potassium: 3.9 mmol/L (ref 3.5–5.1)
Sodium: 137 mmol/L (ref 135–145)

## 2016-10-13 LAB — GLUCOSE, CAPILLARY
Glucose-Capillary: 170 mg/dL — ABNORMAL HIGH (ref 65–99)
Glucose-Capillary: 241 mg/dL — ABNORMAL HIGH (ref 65–99)

## 2016-10-13 LAB — HEMOGLOBIN A1C
HEMOGLOBIN A1C: 8.8 % — AB (ref 4.8–5.6)
MEAN PLASMA GLUCOSE: 206 mg/dL

## 2016-10-13 MED ORDER — ATORVASTATIN CALCIUM 40 MG PO TABS: 40.0000 mg | ORAL_TABLET | Freq: Every day | ORAL | 0 refills | Status: DC

## 2016-10-13 MED ORDER — ASPIRIN 325 MG PO TBEC: 325.0000 mg | DELAYED_RELEASE_TABLET | Freq: Every day | ORAL | 0 refills | Status: DC

## 2016-10-13 MED ORDER — SENNOSIDES-DOCUSATE SODIUM 8.6-50 MG PO TABS
1.0000 | ORAL_TABLET | Freq: Every evening | ORAL | 0 refills | Status: DC | PRN
Start: 2016-10-13 — End: 2016-10-15

## 2016-10-13 NOTE — Care Management Note (Signed)
Case Management Note  Patient Details  Name: Wendy Mcclain MRN: [id number] Date of Birth: 1950-10-10  Subjective/Objective:                    Action/Plan: Pt discharging home with self care. No f/u or DME needs per PT/OT. Pt with insurance and PCP. No further needs per CM.  Expected Discharge Date:  10/13/16               Expected Discharge Plan:  Home/Self Care  In-House Referral:     Discharge planning Services     Post Acute Care Choice:    Choice offered to:     DME Arranged:    DME Agency:     HH Arranged:    HH Agency:     Status of Service:  Completed, signed off  If discussed at [employer] of Stay Meetings, dates discussed:    Additional Comments:  Pollie Friar, RN 10/13/2016, 12:24 PM

## 2016-10-13 NOTE — Progress Notes (Signed)
Transitions of Care Pharmacy Note  Plan:  Educated on Lipitor, ASA.  Addressed concerns regarding drug selection.  Follow-up 3/16 with Fam Med.  --------------------------------------------- ZADIA UHDE is an 66 y.o. female who presents with a chief complaint of chest pain. In anticipation of discharge, pharmacy has reviewed this patient's prior to admission medication history, as well as current inpatient medications listed per the [facility].  Current medication indications, dosing, frequency, and notable side effects reviewed with patient. patient verbalized understanding of current inpatient medication regimen and is aware that the After Visit Summary when presented, will represent the most accurate medication list at discharge.   Rayaan N Pacitti expressed concerns regarding why the doctors started Lipitor instead of Zocor. I explained that the evidence is better for Lipitor in terms of being a high intensity statin and being able to prevent cardiovascular outcomes. She acknowledge the explanation and had no further questions. Patient is also being started on a daily low-dose ASA. She states she has taken it before in the past, but not on a daily basis.    Assessment: Understanding of regimen: good Understanding of indications: good Potential of compliance: good Barriers to Obtaining Medications: No  Patient instructed to contact inpatient pharmacy team with further questions or concerns if needed.    Time spent preparing for discharge counseling: 10 min Time spent counseling patient: 10 min   Thank you for allowing pharmacy to be a part of this patient's care.  Marylou Flesher, PharmD Candidate 10/13/2016, 1:09 PM  Myer Peer Grayland Ormond), PharmD  PGY1 Pharmacy Resident Pager: [phone] 10/13/2016 1:15 PM

## 2016-10-13 NOTE — Discharge Summary (Signed)
Wendy Mcclain Discharge Summary  Patient name: Wendy Mcclain record number: [id number] Date of birth: 23-May-1951 Age: 66 y.o. Gender: female Date of Admission: 10/11/2016  Date of Discharge: 10/13/16 Admitting Physician: Lupita Dawn, MD  Primary Care Provider: Ronnie Doss, DO Consultants: Neurology  Indication for Hospitalization: left upper extremity and lower extremity weakness  Discharge Diagnoses/Problem List:  CVA- right caudate lacunar infarct Chest Pain, resolved T2DM, uncontrolled Allergic rhinitis GERD  Disposition: home  Discharge Condition: stable, improved  Discharge Exam:  General: pleasant, alert, in NAD  Eyes: EOMI, PERRL  HEENTM: NCAT, MMM Neck: supple, no JVD  Cardiovascular: RRR no MRG  Respiratory: CTA B/L , normal work of breathing on RA  Gastrointestinal: soft, NT, ND, +bs  MSK: no edema or tenderness noted Derm: skin is warm and dry Neuro: AAOx3, strength 5/5 in RLE and 4/5 in LLE.  Psych: normal mood and affect   Brief Mcclain Course:  [name] is a 66 year old female presenting to the ED with left sided arm and leg weakness that started early that morning. She also endorsed possible left sided facial droop, some confusion, and new blurred vision over the previous few days. She also reported some chest pain worse with exertion, better with rest, and associated with shortness of breath. Mcclain course is described by problem list below.  Acute CVA: Her symptoms had already started improving on arrival to the ED. CT head negative. tPA not given as Pt was outside window. VSS. MRI with acute right caudate lacunar infarct. Neurology was consulted and recommended usual stroke work-up, Aspirin 325mg  daily, and Lipitor. ECHO with EF 55-60% and G1DD. Carotid dopplers unremarkable. Risk stratification labs with normal TSH, A1c 8.8%. Lipid panel with Chol 164, HDL 45, LDL 79, TG 201. She was evaluated by PT/OT/SLP, all of  which did not recommend any follow-up. On Mcclain day #2, all of her symptoms had almost completely resolved. Neuro additionally recommended 30 day event monitor (see below) and outpatient sleep study (thought patient was showing signs and symptoms of OSA). Follow-up appointment scheduled with Neuro prior to discharge.  Typical Chest Pain: Pt endorsing chest pain consistent with stable angina over the last several weeks. She had a negative exercise stress test in 2015. EKGs negative for ischemia/infarct x 2 but did show low voltage QRS. We FYI'd cards who recommended outpatient stress and Holter monitor. 30 day cardiac event monitor was set up through Dr. Elmarie Shiley office.  Issues for Follow Up:  1. Patient started on Aspirin 325 and Lipitor 40mg  daily. Neurology initially recommended Lipitor 20mg  daily but we increased per pharm recommendations, given her stroke. Please make sure she is taking these medications. 2. Neuro and cards recommended 30 day Holter monitor that will be set up through Dr. Elmarie Shiley office. Please make sure she has received this. 3. Cardiology also recommended possible outpatient stress test. Please make sure she schedules a cardiology follow-up appointment. 4. Neurology recommended sleep study- please refer for this. 5. A1c 8.8%. We treated her with Lantus while hospitalized but continued her Tresiba 32 units daily on discharge.   Significant Procedures: None  Significant Labs and Imaging:   Recent Labs Lab 10/11/16 1222 10/12/16 0213  WBC 5.5 5.1  HGB 13.4 12.3  HCT 39.6 37.4  PLT 241 233    Recent Labs Lab 10/11/16 1222 10/12/16 0213 10/13/16 0350  NA 138 138 137  K 3.4* 4.0 3.9  CL 107 109 110  CO2 22 23 22  GLUCOSE 142* 226* 139*  BUN 7 7 6   CREATININE 0.81 0.77 0.85  CALCIUM 9.0 8.4* 8.7*    CT head- negative MRI/MRA brain- acute right caudate lacunar infarct ECHO- EF 55-60%, G1DD Carotid dopplers- 1-39% stenosis  Results/Tests Pending at Time  of Discharge: none  Discharge Medications:  Allergies as of 10/13/2016      Reactions   Metformin And Related Diarrhea      Medication List    TAKE these medications   albuterol 108 (90 Base) MCG/ACT inhaler Commonly known as:  PROVENTIL HFA;VENTOLIN HFA Inhale 2 puffs into the lungs every 6 (six) hours as needed for wheezing or shortness of breath.   aspirin 325 MG EC tablet Take 1 tablet (325 mg total) by mouth daily. Start taking on:  10/14/2016   atorvastatin 40 MG tablet Commonly known as:  LIPITOR Take 1 tablet (40 mg total) by mouth daily at 6 PM.   cetirizine 10 MG tablet Commonly known as:  ZYRTEC TAKE 1 TABLET BY MOUTH DAILY.   guaiFENesin 600 MG 12 hr tablet Commonly known as:  MUCINEX Take 1 tablet (600 mg total) by mouth 2 (two) times daily. For thinning of secretions   multivitamin tablet Take 1 tablet by mouth daily.   omeprazole 20 MG capsule Commonly known as:  PRILOSEC Take 1 capsule (20 mg total) by mouth daily.   senna-docusate 8.6-50 MG tablet Commonly known as:  Senokot-S Take 1 tablet by mouth at bedtime as needed for mild constipation.   TRESIBA FLEXTOUCH 100 UNIT/ML Sopn FlexTouch Pen Generic drug:  insulin degludec Inject 32 Units into the skin daily.       Discharge Instructions: Please refer to Patient Instructions section of EMR for full details.  Patient was counseled important signs and symptoms that should prompt return to medical care, changes in medications, dietary instructions, activity restrictions, and follow up appointments.   Follow-Up Appointments: Follow-up Information    Dennie Bible, NP. Schedule an appointment as soon as possible for a visit in 6 week(s).   Specialty:  Family Medicine Contact information: [address] [place] [address] [phone]        Ronnie Doss, DO Follow up on 10/15/2016.   Specialty:  Family Medicine Why:  Mcclain follow-up appointment at 2:00PM. Please  arrive at 1:45PM. Contact information: [address] [place] [address] [phone]           Sela Hua, MD 10/13/2016, 12:35 PM PGY-2, [place]

## 2016-10-13 NOTE — Progress Notes (Signed)
Speech-language and cognitive evaluation completed - full report to follow.  MOCA completed with pt scoring 29/30 within normal limits.  Pt with no dysarthria or language deficits.  No SlP follow up indicated.  Luanna Salk, [facility] SLP [phone]

## 2016-10-13 NOTE — Progress Notes (Signed)
Family Medicine Teaching Service Daily Progress Note Intern Pager: [phone]  Patient name: Wendy Mcclain Medical record number: [id number] Date of birth: May 26, 1951 Age: 66 y.o. Gender: female  Primary Care Provider: Ronnie Doss, DO Consultants: Neurology  Code Status: FULL   Pt Overview and Major Events to Date:  Admit 3/12  Assessment and Plan: Wendy Mcclain is a 66 y.o. female presenting with L-sided weakness that began this AM. PMH is significant for uncontrolled T2DM, asthma, GERD, allergic rhinitis and elevated blood pressures without a diagnosis of HTN.    L-sided weakness, r/o CVA.  MRI head - acute right caudate lacunar infarct.  Symptoms have mostly resolved.  Patient has some LLE weakness but has improved and almost back at baseline.  Headache improved.  Echo EF 55-60%.  Carotid dopplers negative for stenosis.   -Neurology following, appreciate recs - Neurology to arrange outpatient sleep study.  -Echocardiogram pending  -Carotid Dopplers pending  -Telemetry monitoring  -PT/OT/SLP eval and treat   -Continue Aspirin 81 mg daily  -Started on a daily statin medication - Lipitor 40 mg  -Permissive HTN for 24-48 hours: can add Hydralazine prn with parameters if needed.  Blood pressures have been stable overnight.   -Risk start labs --> lipid panel wnl except TG high at 201, TSH normal, Hgb A1c 8.8.  -Tylenol 650 mg Q4 prn for headache  -Fall precautions -Q4 neuro checks  -vitals per unit routine   Chest pain, typical.  ACS ruled out with negative troponins x3 and no ischemic changes on EKG.  Sinus rhythm with 1st degree AV block.  Echo 55-60%.  Symptoms consistent with stable angina as they worsen with exertion and associated with some shortness of breath.  Patient has negative exercise in 2015 but may benefit from outpatient stress test given her anginal symptoms.  Will FYI cardiology this morning prior to discharge.   -risk stratification labs as above   T2DM,  uncontrolled.  Most recent A1c 7.6% in 03/2014.  Repeat Hgb A1c on this admission 8.8%.  CBG on admission elevated to 142. Overnight 167, 170, 170.  At home on Tresiba 32 U daily.   -sSSI  -On 15 U Lantus (1/2 home dose) as she is no longer NPO -monitor CBGs 4x daily before meals and at bedtime  -Follow up outpatient with endocrinologist Dr. Buddy Duty for medication adjustment  Hypokalemia K+ 3.9 this AM   -Daily BMET  Allergic Rhinitis, stable.  At home on Claritin.  -Continue home Claritin 10 mg daily.    FEN/GI: Carb modified, KVO, Protonix  Prophylaxis: Lovenox   Disposition: Will discuss with cards for scheduling outpatient stress test and likely discharge home.   Subjective:  Patient feels well and her left leg symptoms have almost completely resolved. She denies any new symptoms.  No concerns at this time.  Objective: Temp:  [97.7 F (36.5 C)-98.2 F (36.8 C)] 98.1 F (36.7 C) (03/14 0943) Pulse Rate:  [67-84] 73 (03/14 0943) Resp:  [18] 18 (03/14 0943) BP: (104-142)/(53-77) 129/58 (03/14 0943) SpO2:  [95 %-100 %] 99 % (03/14 0943)   Physical Exam:   General: pleasant, alert, in NAD  Eyes: EOMI, PERRL  HEENTM: NCAT, MMM Neck: supple, no JVD  Cardiovascular: RRR no MRG  Respiratory: CTA B/L , normal work of breathing on RA  Gastrointestinal: soft, NT, ND, +bs  MSK: no edema or tenderness noted Derm: skin is warm and dry Neuro: AAOx3, no focal deficits, strength 5/5 in RLE and 4/5 in LLE.  Psych: normal mood and affect   Laboratory:  Recent Labs Lab 10/11/16 1222 10/12/16 0213  WBC 5.5 5.1  HGB 13.4 12.3  HCT 39.6 37.4  PLT 241 233    Recent Labs Lab 10/11/16 1222 10/12/16 0213 10/13/16 0350  NA 138 138 137  K 3.4* 4.0 3.9  CL 107 109 110  CO2 22 23 22   BUN 7 7 6   CREATININE 0.81 0.77 0.85  CALCIUM 9.0 8.4* 8.7*  GLUCOSE 142* 226* 139*   Imaging/Diagnostic Tests: No results found.  Wendy Kim, MD 10/13/2016, 12:23 PM PGY-1, [place] Intern pager: [phone], text pages welcome

## 2016-10-13 NOTE — Progress Notes (Signed)
**  Social Note**  Stopped by to see Wendy Mcclain this am.  She appears to be doing well.  She voices great appreciation for the excellent care being provided by the [facility] inpatient team.  Seen in conjunction with Dr Reesa Chew this am.  Discharge instructions reviewed.  I look forward to seeing this patient in the office.  Wendy Mcclain M. Lajuana Ripple, DO PGY-3, [facility]

## 2016-10-13 NOTE — Discharge Instructions (Signed)
It was a pleasure taking care of you during this hospitalization!  You were hospitalized because you had a stroke. We started you on two medications to help prevent future strokes- Aspirin and Lipitor. Please make sure you take these medications every day.  It is very important that you go to your hospital follow-up appointment on 3/16 at 2:00PM with Dr. Lajuana Ripple.

## 2016-10-13 NOTE — Progress Notes (Signed)
Patient request for home health aid at discharge to assist with additional help at home. MD paged for further order.  Ave Filter, RN

## 2016-10-13 NOTE — Consult Note (Signed)
           [facility] CM Primary Care Navigator  10/13/2016  LYNSEE WANDS 23-Jan-1951 [id number]   Went to see patient at the bedside to identify possible discharge needs but patient had been discharged per staff report.  Patient was discharged home today. Noted on discharge summary/ instruction that patient has a post hospital follow-up visit scheduled with Ronnie Doss, DO (primary care provider) on 10/15/16 at 2 pm.  For questions, please contact:  Dannielle Huh, BSN, RN- [facility] Primary Care Navigator  Telephone: [phone] [place]

## 2016-10-13 NOTE — Progress Notes (Addendum)
Discharge instructions reviewed with patient/family. All questions answered at this time. Patient aware to call  Dr. Cathie Olden office for monitor set up. Transport home by family.  Ave Filter, RN

## 2016-10-13 NOTE — Evaluation (Signed)
Speech Language Pathology Evaluation Patient Details Name: Wendy Mcclain MRN: [id number] DOB: 1951/04/03 Today's Date: 10/13/2016 Time: 6503-5465 SLP Time Calculation (min) (ACUTE ONLY): 10 min  Problem List:  Patient Active Problem List   Diagnosis Date Noted  . Left leg weakness   . Cerebrovascular accident (CVA) ([place])   . Hyperlipidemia   . OSA (obstructive sleep apnea)   . Left-sided weakness 10/11/2016  . Orthostatic hypotension 09/18/2015  . Chest pain 08/15/2015  . Acute sinusitis 08/19/2014  . Yeast infection 08/19/2014  . Vitamin D deficiency 05/20/2014  . Gastro-esophageal reflux 03/20/2014  . Allergic rhinitis 07/09/2013  . Sleep disorder 07/09/2013  . Elevated blood pressure (not hypertension) 07/09/2013  . Mood disorder ([place]) 11/23/2011  . Diabetes mellitus ([place]) 11/23/2011  . Health care maintenance 11/23/2011   Past Medical History:  Past Medical History:  Diagnosis Date  . Arrhythmia   . Asthma   . Diabetes mellitus   . GERD (gastroesophageal reflux disease)    Past Surgical History:  Past Surgical History:  Procedure Laterality Date  . ABDOMINAL HYSTERECTOMY  1993  . APPENDECTOMY  1963  . BREAST ENHANCEMENT SURGERY  1986   HPI:  66 yo female adm to [facility] with left leg and arm weakness/sensory changes.  Pt imaging study showed right caudate lacunar CVA.  Speech and language evaluation ordered.    Assessment / Plan / Recommendation Clinical Impression  MOCA 8.1 completed with pt scoring 29/30 within normal limits.  Pt recalled 4/5 words independently, 1 with multiple choice cue.  Pt with no dysarthria or language deficits and she reports speech is normal.  NO focal CN deficits impacting speech.       SLP Assessment  SLP Recommendation/Assessment: Patient does not need any further Speech Lanaguage Pathology Services    Follow Up Recommendations  None    Frequency and Duration           SLP Evaluation Cognition  Overall Cognitive Status: Within  Functional Limits for tasks assessed Arousal/Alertness: Awake/alert Orientation Level: Oriented X4 Attention: Selective Memory: Appears intact Awareness: Appears intact Problem Solving: Appears intact Safety/Judgment: Appears intact       Comprehension  Auditory Comprehension Overall Auditory Comprehension: Appears within functional limits for tasks assessed Yes/No Questions: Within Functional Limits Commands: Within Functional Limits Conversation: Complex Visual Recognition/Discrimination Discrimination: Within Function Limits Reading Comprehension Reading Status: Within funtional limits    Expression Expression Primary Mode of Expression: Verbal Verbal Expression Overall Verbal Expression: Appears within functional limits for tasks assessed Initiation: Impaired Repetition: No impairment Naming: No impairment Pragmatics: No impairment Written Expression Dominant Hand: Right Written Expression: Within Functional Limits   Oral / Motor  Oral Motor/Sensory Function Overall Oral Motor/Sensory Function: Within functional limits Motor Speech Respiration: Within functional limits Resonance: Within functional limits Articulation: Within functional limitis Motor Planning: Witnin functional limits   GO                    Luanna Salk, [place] [facility] SLP [phone]

## 2016-10-15 ENCOUNTER — Other Ambulatory Visit: Payer: Self-pay | Admitting: Family Medicine

## 2016-10-15 ENCOUNTER — Encounter: Payer: Self-pay | Admitting: Family Medicine

## 2016-10-15 ENCOUNTER — Ambulatory Visit (INDEPENDENT_AMBULATORY_CARE_PROVIDER_SITE_OTHER): Payer: Medicare Other | Admitting: Family Medicine

## 2016-10-15 VITALS — BP 130/68 | HR 88 | Temp 97.9°F | Ht 63.0 in | Wt 143.6 lb

## 2016-10-15 DIAGNOSIS — I63311 Cerebral infarction due to thrombosis of right middle cerebral artery: Secondary | ICD-10-CM | POA: Diagnosis present

## 2016-10-15 MED ORDER — ATORVASTATIN CALCIUM 40 MG PO TABS: ORAL_TABLET | ORAL | 4 refills | Status: DC

## 2016-10-15 MED ORDER — ASPIRIN 325 MG PO TBEC: 325.0000 mg | DELAYED_RELEASE_TABLET | Freq: Every day | ORAL | 4 refills | Status: DC

## 2016-10-15 MED ORDER — ALBUTEROL SULFATE HFA 108 (90 BASE) MCG/ACT IN AERS: 2.0000 | INHALATION_SPRAY | Freq: Four times a day (QID) | RESPIRATORY_TRACT | 1 refills | Status: DC | PRN

## 2016-10-15 MED ORDER — CETIRIZINE HCL 10 MG PO TABS: 10.0000 mg | ORAL_TABLET | Freq: Every day | ORAL | 4 refills | Status: DC

## 2016-10-15 MED ORDER — SENNOSIDES-DOCUSATE SODIUM 8.6-50 MG PO TABS
1.0000 | ORAL_TABLET | Freq: Every evening | ORAL | 4 refills | Status: DC | PRN
Start: 2016-10-15 — End: 2016-11-11

## 2016-10-15 NOTE — Assessment & Plan Note (Signed)
PT/OT/SLP referral placed. I think she would benefit from therapy.  She also would benefit from home aide for ADLs/iADLs.  She will drop off the forms for me to complete for this.  She has persistent Left sided weakness and numbness/tingling on exam.  No new deficits.  Appts scheduled: Event monitor 10/18/16, Dr Buddy Duty 10/19/16, Sleep consult 10/26/16, Neuro follow up 11/30/16.  Will send in 90 day supplies of her medications to the mail order pharmacy.

## 2016-10-15 NOTE — Progress Notes (Signed)
    Subjective: CC: hospital follow up HPI: Wendy Mcclain is a 66 y.o. female presenting to clinic today for:  Hospital follow up Patient reports that she continues to live alone but her daughters are very involved and help her daily.  Her husband is deceased so otherwise does not have help.  She reports difficulty with balance, LLE weakness, loss of words.  She reports a little difficulty with swallowing but no choking/ aspiration.  She is having difficulty with bathing.  She reports occ difficulty with dressing.  No difficulty toileting, grooming.  No falls.  She reports compliance with medications.  Event monitor 10/18/16, Dr Buddy Duty 10/19/16, Sleep consult 10/26/16, Neuro follow up 11/30/16.  Social Hx reviewed. MedHx, medications and allergies reviewed.  Please see EMR. Health Maintenance: PNA vaccine  ROS: Per HPI  Objective: Office vital signs reviewed. BP 130/68   Pulse 88   Temp 97.9 F (36.6 C) (Oral)   Ht 5\' 3"  (1.6 m)   Wt 143 lb 9.6 oz (65.1 kg)   SpO2 97%   BMI 25.44 kg/m   Physical Examination:  General: Awake, alert, well nourished, well appearing female, No acute distress HEENT: Normal    Eyes: PERRLA, EOMI, sclera white    Throat: moist mucus membranes, no erythema, no tonsillar exudate.  Airway is patent Cardio: regular rate and rhythm, S1S2 heard, no murmurs appreciated Pulm: clear to auscultation bilaterally, no wheezes, rhonchi or rales; normal work of breathing on room air Neuro: 4/5 UE Strength bilaterally with mild weakness of LUE compared to RUE.  LLE 3+/5, RLE 4/5. Decreased light touch sensation to Left side of face, LUE, LLE.  Otherwise, grossly intact, CN 2-12 grossly in tact with exception as above.  Speech normal.  UE cerebellar testing normal.  LE cerebellar testing difficult on LLE because of weakness.  Assessment/ Plan: 66 y.o. female   Cerebrovascular accident (CVA) ([place]) PT/OT/SLP referral placed. I think she would benefit from therapy.  She  also would benefit from home aide for ADLs/iADLs.  She will drop off the forms for me to complete for this.  She has persistent Left sided weakness and numbness/tingling on exam.  No new deficits.  Appts scheduled: Event monitor 10/18/16, Dr Buddy Duty 10/19/16, Sleep consult 10/26/16, Neuro follow up 11/30/16.  Will send in 90 day supplies of her medications to the mail order pharmacy.     Janora Norlander, DO PGY-3, [facility]

## 2016-10-17 ENCOUNTER — Telehealth: Payer: Self-pay | Admitting: Internal Medicine

## 2016-10-17 NOTE — Telephone Encounter (Signed)
[facility] After Hours Telephone Line   Patient calling with question about numbness and tingling in her L arm and face. Patient had CVA about one week ago with primarily L upper extremity weakness. She was seen by PCP Dr. Lajuana Ripple on Friday, where she was noted to have residual weakness. She also reported persisted numbness and tingling at that time. Patient reports that she had another episode of numbness and tingling lasting about 30 minutes today, but says the symptoms are improving already. Denies worsening weakness. Denies facial drooping or slurring of words. Patient has appt with neurology on 5/1 and appt with cardiology for event monitor placement tomorrow morning. As numbness and tingling are not new and patient does not have any red flags for new CVA, feel that patient is stable for outpatient follow-up. Discussed reasons to go to ED (worsening weakness, drooping face, slurred speech). Patient voiced understanding and appreciation.   Adin Hector, MD, MPH PGY-2 [facility] Pager [phone]

## 2016-10-18 ENCOUNTER — Ambulatory Visit (INDEPENDENT_AMBULATORY_CARE_PROVIDER_SITE_OTHER): Payer: Medicare Other

## 2016-10-18 ENCOUNTER — Other Ambulatory Visit (HOSPITAL_COMMUNITY): Payer: Self-pay | Admitting: Internal Medicine

## 2016-10-18 ENCOUNTER — Telehealth: Payer: Self-pay | Admitting: Family Medicine

## 2016-10-18 DIAGNOSIS — I639 Cerebral infarction, unspecified: Secondary | ICD-10-CM | POA: Diagnosis not present

## 2016-10-18 DIAGNOSIS — I4891 Unspecified atrial fibrillation: Secondary | ICD-10-CM

## 2016-10-18 DIAGNOSIS — I63311 Cerebral infarction due to thrombosis of right middle cerebral artery: Secondary | ICD-10-CM

## 2016-10-18 NOTE — Telephone Encounter (Signed)
Dept. [place] form dropped off for at front desk for completion.  Verified that patient section of form has been completed.  Last DOS/WCC with PCP was 10/15/16.  Placed form in team folder to be completed by clinical staff.  Crista Luria

## 2016-10-19 ENCOUNTER — Other Ambulatory Visit: Payer: Self-pay

## 2016-10-19 DIAGNOSIS — Z794 Long term (current) use of insulin: Secondary | ICD-10-CM | POA: Diagnosis not present

## 2016-10-19 DIAGNOSIS — E119 Type 2 diabetes mellitus without complications: Secondary | ICD-10-CM | POA: Diagnosis not present

## 2016-10-19 NOTE — Patient Outreach (Signed)
[facility]) Care Management  10/19/2016  Wendy Mcclain 12-29-1950 [id number]      EMMI- STROKE RED ON EMMI ALERT Day # 3 Date: 10/18/16 Red Alert Reason:  "Feeling worse overall? Yes" " New or worsening pain/fever/ SOB? Yes"    Outreach attempt #1 to patient.Spoke with patient. Addressed and reviewed red alert. Patient reported that over the weekend she did not feel good. She was having more loss of her words, memory, headaches and some numbness/tingling. She has been having these symptoms since discharge home but it was worse than normal over weekend. She states she called and spoke with on-call MD at PCP office. Since then she has been feeling and doing a little better. She currently just left appt to have heart monitor placed on her. She completed PCP f/u appt on 10/15/16 and has stroke f/u appt 11/30/16. Patient reports that her dtrs are very supportive and have been helping her out and taking her to her appts. She states that due to some residual weakness PCP has ordered aide,PT,OT,ST services in the home. Patient denies any issues with her meds. RN CM educated patient s/s of stroke and when to seek medical attention. Patient voiced understanding. Advised patient that she would continue to get automated EMMI Stroke post discharge calls over the course of two weeks. Advised that if any of her responses are abnormal that it will trigger a call from a nurse to check on her.     Plan: RN CM will notify [facility] administrative assistant of case status.   Enzo Montgomery, RN,BSN,CCM [facility] Telephonic Care Management Coordinator Direct Phone: [phone] Toll Free: [phone] Fax: [phone]

## 2016-10-20 NOTE — Telephone Encounter (Signed)
Did complete some clinical info on [place] form but just vitals, not knowing exactly what to fill out due to the sample page being completed by pt.  Place form in Dr. Marjean Donna box for completion.  Katharina Caper, Kj Imbert D, [place]

## 2016-10-20 NOTE — Telephone Encounter (Signed)
Pt would like to be called as soon as this is ready to be picked up. [phone] ep

## 2016-10-21 NOTE — Telephone Encounter (Signed)
Patient notified that forms are in [facility] ready for pick up. Hubbard Hartshorn, RN, BSN

## 2016-10-21 NOTE — Telephone Encounter (Signed)
These have been completed and returned to [facility] for pick up.

## 2016-10-25 DIAGNOSIS — Z794 Long term (current) use of insulin: Secondary | ICD-10-CM | POA: Diagnosis not present

## 2016-10-25 DIAGNOSIS — E119 Type 2 diabetes mellitus without complications: Secondary | ICD-10-CM | POA: Diagnosis not present

## 2016-10-26 ENCOUNTER — Ambulatory Visit (INDEPENDENT_AMBULATORY_CARE_PROVIDER_SITE_OTHER): Payer: Medicare Other | Admitting: Neurology

## 2016-10-26 ENCOUNTER — Encounter: Payer: Self-pay | Admitting: Neurology

## 2016-10-26 VITALS — BP 136/71 | HR 75 | Resp 20 | Ht 63.0 in | Wt 142.0 lb

## 2016-10-26 DIAGNOSIS — R51 Headache: Secondary | ICD-10-CM

## 2016-10-26 DIAGNOSIS — Z8673 Personal history of transient ischemic attack (TIA), and cerebral infarction without residual deficits: Secondary | ICD-10-CM

## 2016-10-26 DIAGNOSIS — I6381 Other cerebral infarction due to occlusion or stenosis of small artery: Secondary | ICD-10-CM

## 2016-10-26 DIAGNOSIS — I639 Cerebral infarction, unspecified: Secondary | ICD-10-CM

## 2016-10-26 DIAGNOSIS — R519 Headache, unspecified: Secondary | ICD-10-CM

## 2016-10-26 DIAGNOSIS — R351 Nocturia: Secondary | ICD-10-CM | POA: Diagnosis not present

## 2016-10-26 DIAGNOSIS — R400 Somnolence: Secondary | ICD-10-CM | POA: Diagnosis not present

## 2016-10-26 DIAGNOSIS — R0681 Apnea, not elsewhere classified: Secondary | ICD-10-CM | POA: Diagnosis not present

## 2016-10-26 DIAGNOSIS — R0683 Snoring: Secondary | ICD-10-CM | POA: Diagnosis not present

## 2016-10-26 NOTE — Progress Notes (Signed)
Subjective:    Patient ID: Wendy Mcclain is a 66 y.o. female.  HPI    History:   Dear Wendy Mcclain,   I saw your patient, Wendy Mcclain, upon your kind request in my clinic today for initial consultation of her sleep disorder, in particular, concern for underlying obstructive sleep apnea. The patient is unaccompanied today. As you know, Wendy Mcclain is a 66 year old right-handed woman with an underlying medical history of allergic rhinitis, reflux disease, type 2 diabetes, overweight state, hyperlipidemia, recent right caudate lacunar infarct on 10/11/2016, who reports snoring and excessive daytime somnolence. Her Epworth sleepiness score is 6 out of 24, fatigue score is 61 out of 63. She is widowed, she lives alone, she has to grown children. She is retired. She has been a nonsmoker. She quit drinking alcohol in 1995, she drinks caffeine in the form ofDiet soda about 3 bottles per day and coffee, about 1-2 cups per day. She was in the hospital from 10/11/2016 through 10/13/2016 for left-sided weakness. She had stroke workup. MRI head without contrast and MRA head without contrast on 10/11/2016 showed: IMPRESSION: MRI HEAD: Acute RIGHT caudate lacunar infarct.   Otherwise normal MRI head.   MRA HEAD: Normal.  I reviewed the hospital records including her discharge summary and test results. Initial head CT was negative, she did not receive TPA as she was outside the window. She was started on adult size aspirin and Lipitor. Echocardiogram showed EF of 55-60%, A1c was suboptimal at 8.8, lipid panel showed LDL of 79, carotid Doppler showed no significant carotid artery stenosis bilaterally. She was advised to have a 30 day Holter monitor. She was also recommended to have a sleep study done.  She had felt fairly well when she was in the hospital, after her discharge, she had some flareup of her left-sided numbness and weakness. She has a Holter monitor currently. She has been trying to exercise but is  also going to start therapy outpatient. She was alarmed by the flareup of her numbness. She had no new symptoms however. Her daughters check on her. She has 2 grown daughters, 18 grandsons, one granddaughter, and 5 great-grandchildren. 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 She is widowed. Her daughters are from her first marriage, she got divorced and then was married for 29 years. Bedtime is around 9 PM and wakeup time currently around 7 AM. She does have nocturia about 2-3 times per average night and occasional morning headaches. She's not aware of any family history of OSA but is familiar with sleep apnea and CPAP. She has been told that she has breathing pauses while asleep and has woken herself up from her snoring and gasping for air.   Her Past Medical History Is Significant For: Past Medical History:  Diagnosis Date  . Arrhythmia   . Asthma   . Diabetes mellitus   . GERD (gastroesophageal reflux disease)    Her Past Surgical History Is Significant For: Past Surgical History:  Procedure Laterality Date  . ABDOMINAL HYSTERECTOMY  1993  . APPENDECTOMY  1963  . BREAST ENHANCEMENT SURGERY  1986    Her Family History Is Significant For: Family History  Problem Relation Age of Onset  . Alzheimer's disease Mother   . Cancer Father     esophageal  . Aortic aneurysm Paternal Aunt     2 aunts died at age 47  . Cancer Sister     stomach  .  Cancer Paternal Grandfather     mouth & larynx  . Cancer Paternal Aunt     colon  . Heart disease    . Diabetes    . Hyperlipidemia    . Hypertension    . Stroke    . Thyroid disease Sister   . Hyperlipidemia Daughter   . Thyroid disease Daughter   . Colon cancer Neg Hx     Her Social History Is Significant For: Social History   Social History  . Marital status: Married    Spouse name: N/A  . Number of children: N/A  . Years of education: N/A   Social History Main Topics  . Smoking  status: Never Smoker  . Smokeless tobacco: Never Used  . Alcohol use No  . Drug use: No  . Sexual activity: Not Asked   Other Topics Concern  . None   Social History Narrative   Widowed   Lives in [place] to move in with daughter   2 daughters - 1 in [place]; 1 in [place]   6 grandchildren    Her Allergies Are:  Allergies  Allergen Reactions  . Metformin And Related Diarrhea  :   Her Current Medications Are:  Outpatient Encounter Prescriptions as of 10/26/2016  Medication Sig  . albuterol (PROVENTIL HFA;VENTOLIN HFA) 108 (90 Base) MCG/ACT inhaler Inhale 2 puffs into the lungs every 6 (six) hours as needed for wheezing or shortness of breath.  Marland Kitchen aspirin 325 MG EC tablet Take 1 tablet (325 mg total) by mouth daily.  Marland Kitchen atorvastatin (LIPITOR) 40 MG tablet TAKE 1 TABLET BY MOUTH DAILY AT 6 PM  . cetirizine (ZYRTEC) 10 MG tablet Take 1 tablet (10 mg total) by mouth daily.  Marland Kitchen guaiFENesin (MUCINEX) 600 MG 12 hr tablet Take 1 tablet (600 mg total) by mouth 2 (two) times daily. For thinning of secretions  . HUMALOG KWIKPEN 100 UNIT/ML KiwkPen Inject 4 Units into the skin 4 (four) times daily -  before meals and at bedtime.  . Insulin Degludec (TRESIBA FLEXTOUCH) 100 UNIT/ML SOPN Inject 32 Units into the skin daily.  . Multiple Vitamin (MULTIVITAMIN) tablet Take 1 tablet by mouth daily.  Marland Kitchen omeprazole (PRILOSEC) 20 MG capsule Take 1 capsule (20 mg total) by mouth daily.  Marland Kitchen senna-docusate (SENOKOT-S) 8.6-50 MG tablet Take 1 tablet by mouth at bedtime as needed for mild constipation.   No facility-administered encounter medications on file as of 10/26/2016.   :  Review of Systems:  Out of a complete 14 point review of systems, all are reviewed and negative with the exception of these symptoms as listed below:  Review of Systems  Neurological:       Pt presents today to discuss her sleep. Pt does endorse snoring but has never had a sleep study.  Epworth Sleepiness  Scale 0= would never doze 1= slight chance of dozing 2= moderate chance of dozing 3= high chance of dozing  Sitting and reading: 1 Watching TV: 2 Sitting inactive in a public place (ex. Theater or meeting): 0 As a passenger in a car for an hour without a break: 1 Lying down to rest in the afternoon: 1 Sitting and talking to someone: 0 Sitting quietly after lunch (no alcohol): 1 In a car, while stopped in traffic: 0 Total: 6     Objective:  Neurologic Exam  Physical Exam Physical Examination:   Vitals:   10/26/16 1129  BP: 136/71  Pulse: 75  Resp: 20  General Examination: The patient is a very pleasant 66 y.o. female in no acute distress. She appears well-developed and well-nourished and well groomed.   HEENT: Normocephalic, atraumatic, pupils are equal, round and reactive to light and accommodation. Funduscopic exam is normal with sharp disc margins noted. Extraocular tracking is good without limitation to gaze excursion or nystagmus noted. Normal smooth pursuit is noted. Hearing is grossly intact. Face is symmetric with normal facial animation and normal facial sensation. Speech is clear with no dysarthria noted. There is no hypophonia. There is no lip, neck/head, jaw or voice tremor. Neck is supple with full range of passive and active motion. There are no carotid bruits on auscultation. Oropharynx exam reveals: mild mouth dryness, adequate dental hygiene and moderate airway crowding, due to smaller airway entry, tonsils in place, uvula is elongated. Tonsils are 1+ bilaterally. Mallampati is class II. Tongue protrudes centrally and palate elevates symmetrically. She has a nearly absent overbite.  Chest: Clear to auscultation without wheezing, rhonchi or crackles noted.  Heart: S1+S2+0, regular and normal without murmurs, rubs or gallops noted.   Abdomen: Soft, non-tender and non-distended with normal bowel sounds appreciated on auscultation.  Extremities: There is no  pitting edema in the distal lower extremities bilaterally. Pedal pulses are intact.  Skin: Warm and dry without trophic changes noted.  Musculoskeletal: exam reveals no obvious joint deformities, tenderness or joint swelling or erythema.   Neurologically:  Mental status: The patient is awake, alert and oriented in all 4 spheres. Her immediate and remote memory, attention, language skills and fund of knowledge are appropriate. There is no evidence of aphasia, agnosia, apraxia or anomia. Speech is clear with normal prosody and enunciation. Thought process is linear. Mood is normal and affect is normal.  Cranial nerves II - XII are as described above under HEENT exam. In addition: shoulder shrug is normal with equal shoulder height noted. Motor exam: Normal bulk, strength and tone is noted on the right, she has 4 out of 5 weakness on the left. Romberg is negative, tandem walk is difficult for her though. Reflexes are 2+ throughout. Fine motor skills are preserved on the right and mildly impaired on the left. Sensory exam shows decreased sensation in the left hemibody. Cerebellar testing: No dysmetria or intention tremor on finger to nose testing.  She stands with very mild difficulty, she has a mild limp on the left. She walks cautiously and slowly.   Assessment and Plan:  In summary, Wendy Mcclain is a very pleasant 66 y.o.-year old female with an underlying medical history of allergic rhinitis, reflux disease, type 2 diabetes, overweight state, hyperlipidemia, recent right caudate lacunar infarct on 10/11/2016, whose history and physical exam are concerning for obstructive sleep apnea (OSA). I had a long chat with the patient about my findings and the diagnosis of OSA, its prognosis and treatment options. We talked about medical treatments, surgical interventions and non-pharmacological approaches. I explained in particular the risks and ramifications of untreated moderate to severe OSA, especially  with respect to developing cardiovascular disease down the Road, including congestive heart failure, difficult to treat hypertension, cardiac arrhythmias, or stroke. Even type 2 diabetes has, in part, been linked to untreated OSA. Symptoms of untreated OSA include daytime sleepiness, memory problems, mood irritability and mood disorder such as depression and anxiety, lack of energy, as well as recurrent headaches, especially morning headaches. We talked about trying to maintain a healthy lifestyle in general, as well as the importance of weight control. I encouraged  the patient to eat healthy, exercise daily and keep well hydrated, to keep a scheduled bedtime and wake time routine, to not skip any meals and eat healthy snacks in between meals. I advised the patient not to drive when feeling sleepy. We talked about stroke workup and secondary stroke prevention at length as well today. She is advised to keep all her appointments including completing the Holter monitor and her stroke follow-up appointment pending for first of May. She is advised to continue all her medications. She is advised to follow through with her outpatient therapy.   I recommended the following at this time: sleep study with potential positive airway pressure titration. (We will score hypopneas at 4%).   I explained the sleep test procedure to the patient and also outlined possible surgical and non-surgical treatment options of OSA, including the use of a custom-made dental device (which would require a referral to a specialist dentist or oral surgeon), upper airway surgical options, such as pillar implants, radiofrequency surgery, tongue base surgery, and UPPP (which would involve a referral to an ENT surgeon). Rarely, jaw surgery such as mandibular advancement may be considered.  I also explained the CPAP treatment option to the patient, who indicated that she would be willing to try CPAP if the need arises. I explained the importance of  being compliant with PAP treatment, not only for insurance purposes but primarily to improve Her symptoms, and for the patient's long term health benefit, including to reduce Her cardiovascular risks. I answered all her questions today and the patient was in agreement. I would like to see her back after the sleep study is completed and encouraged her to call with any interim questions, concerns, problems or updates.   Thank you very much for allowing me to participate in the care of this nice patient. If I can be of any further assistance to you please do not hesitate to talk to me.  Sincerely,   Star Age, MD, PhD

## 2016-10-26 NOTE — Patient Instructions (Addendum)
Based on your symptoms and your exam I believe you are at risk for obstructive sleep apnea or OSA, and I think we should proceed with a sleep study to determine whether you do or do not have OSA and how severe it is. If you have more than mild OSA, I want you to consider treatment with CPAP. Please remember, the risks and ramifications of moderate to severe obstructive sleep apnea or OSA are: Cardiovascular disease, including congestive heart failure, stroke, difficult to control hypertension, arrhythmias, and even type 2 diabetes has been linked to untreated OSA. Sleep apnea causes disruption of sleep and sleep deprivation in most cases, which, in turn, can cause recurrent headaches, problems with memory, mood, concentration, focus, and vigilance. Most people with untreated sleep apnea report excessive daytime sleepiness, which can affect their ability to drive. Please do not drive if you feel sleepy.   I will likely see you back after your sleep study to go over the test results and where to go from there. We will call you after your sleep study to advise about the results (most likely, you will hear from Beverlee Nims, my nurse) and to set up an appointment at the time, as necessary.    Our sleep lab administrative assistant, Arrie Aran will meet with you or call you to schedule your sleep study. If you don't hear back from her by next week please feel free to call her at [phone]. This is her direct line and please leave a message with your phone number to call back if you get the voicemail box. She will call back as soon as possible.   Continue exercising regularly and take your medications as directed. As discussed, secondary prevention is key after a stroke. This means: taking care of blood sugar values or diabetes management, good blood pressure (hypertension) control and optimizing cholesterol management, exercising daily or regularly within your own mobility limitations of course, and overall cardiovascular  risk factor reduction, which includes screening for and treatment of obstructive sleep apnea (OSA) and weight management.

## 2016-11-10 ENCOUNTER — Other Ambulatory Visit: Payer: Self-pay | Admitting: Internal Medicine

## 2016-11-11 ENCOUNTER — Other Ambulatory Visit: Payer: Self-pay | Admitting: Internal Medicine

## 2016-11-16 ENCOUNTER — Encounter: Payer: Medicare Other | Admitting: Occupational Therapy

## 2016-11-16 ENCOUNTER — Ambulatory Visit: Payer: Medicare Other | Attending: Family Medicine | Admitting: Physical Therapy

## 2016-11-16 ENCOUNTER — Ambulatory Visit: Payer: Medicare Other

## 2016-11-16 ENCOUNTER — Encounter: Payer: Self-pay | Admitting: Physical Therapy

## 2016-11-16 VITALS — BP 137/65

## 2016-11-16 DIAGNOSIS — R261 Paralytic gait: Secondary | ICD-10-CM | POA: Diagnosis not present

## 2016-11-16 DIAGNOSIS — R41841 Cognitive communication deficit: Secondary | ICD-10-CM | POA: Insufficient documentation

## 2016-11-16 DIAGNOSIS — R278 Other lack of coordination: Secondary | ICD-10-CM | POA: Insufficient documentation

## 2016-11-16 DIAGNOSIS — R2681 Unsteadiness on feet: Secondary | ICD-10-CM | POA: Diagnosis not present

## 2016-11-16 DIAGNOSIS — I69318 Other symptoms and signs involving cognitive functions following cerebral infarction: Secondary | ICD-10-CM | POA: Insufficient documentation

## 2016-11-16 DIAGNOSIS — Z9181 History of falling: Secondary | ICD-10-CM | POA: Insufficient documentation

## 2016-11-16 DIAGNOSIS — M6281 Muscle weakness (generalized): Secondary | ICD-10-CM | POA: Diagnosis not present

## 2016-11-16 DIAGNOSIS — R41842 Visuospatial deficit: Secondary | ICD-10-CM | POA: Insufficient documentation

## 2016-11-16 DIAGNOSIS — R2689 Other abnormalities of gait and mobility: Secondary | ICD-10-CM | POA: Diagnosis not present

## 2016-11-16 DIAGNOSIS — I69354 Hemiplegia and hemiparesis following cerebral infarction affecting left non-dominant side: Secondary | ICD-10-CM | POA: Insufficient documentation

## 2016-11-16 NOTE — Therapy (Signed)
[facility] [address] [place], [place], [address] Phone: [phone]   Fax:  [phone]  Physical Therapy Evaluation  Patient Details  Name: Wendy Mcclain MRN: [id number] Date of Birth: 1950-12-30 Referring Provider: Ronnie Doss DO   Encounter Date: 11/16/2016      PT End of Session - 11/16/16 1313    Visit Number 1   Number of Visits 17   Date for PT Re-Evaluation 01/14/17   Authorization Type Medicare & G-codes    PT Start Time 0930   PT Stop Time 1015   PT Time Calculation (min) 45 min   Activity Tolerance Patient tolerated treatment well   Behavior During Therapy [facility] for tasks assessed/performed      Past Medical History:  Diagnosis Date  . Arrhythmia   . Asthma   . Diabetes mellitus   . GERD (gastroesophageal reflux disease)     Past Surgical History:  Procedure Laterality Date  . ABDOMINAL HYSTERECTOMY  1993  . APPENDECTOMY  1963  . BREAST ENHANCEMENT SURGERY  1986    Vitals:   11/16/16 1216  BP: 137/65         Subjective Assessment - 11/16/16 1216    Subjective Patient is a 66 year old female reporting to PT s/p R caudate lacunar infarct CVA on 10/11/2016 resulting in L sided numbness and weakness. Patient reports she woke on 10/11/2016 and could not feel or move her LLE. Patient presents to PT today walking without an assistive device. Patient reports she has reduced feeling and strength in L side, and reports she has a fear of falling that has caused her to decreae her activity level. Prior to the CVA, the patient worked as a Chief Technology Officer, but since the CVA she has been unable to work due to mobility and balance deficits. The patient lives alone, but has family that is able to help intermittently.   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 No/denies            [facility] PT Assessment - 11/16/16 0930      Assessment   Medical Diagnosis CVA    Referring Provider Ronnie Doss DO    Onset Date/Surgical Date 10/11/16   Hand Dominance Right     Precautions   Precautions Fall     Balance Screen   Has the patient fallen in the past 6 months Yes   How many times? 1  coming down the steps with lights off    Has the patient had a decrease in activity level because of a fear of falling?  Yes   Is the patient reluctant to leave their home because of a fear of falling?  No     Home Environment   Living Environment Private residence   Living Arrangements Alone   Available Help at Discharge Family  daughter and adult granddaughter intermittently   Type of [place] to enter;Ramped entrance  ramped entrance on back but must walk on grass   Entrance Stairs-Number of Steps 5   Entrance Stairs-Rails Cannot reach both   [place] to live on main level with bedroom/bathroom  Home Equipment Grab bars - tub/shower;Grab bars - toilet  homemade cane without tip      Prior Function   Level of Independence Independent   Vocation Part time employment   Vocation Requirements bending and lifting light weight, standing tolerance    Leisure getting out with grandchildren, attending their sporting events      Coordination   Gross Motor Movements are Fluid and Coordinated No   Coordination and Movement Description dysmetric and ataxic gross LLE motion with heel shin test and alternating foot tapping   Heel Shin Test ataxic and dysmetric heel to shin motion      Posture/Postural Control   Posture/Postural Control Postural limitations   Postural Limitations Rounded Shoulders;Forward head;Flexed trunk     ROM /  Strength   AROM / PROM / Strength AROM;Strength     AROM   Overall AROM  Within functional limits for tasks performed   Overall AROM Comments gross, normal AROM in BUE and BLE    AROM Assessment Site --   Right/Left Shoulder --     Strength   Overall Strength Deficits   Overall Strength Comments RUE and RLE are both grossly wihtin normal limits    Strength Assessment Site Hip;Knee;Ankle   Right/Left Hip Left   Left Hip Flexion 4-/5   Left Hip Extension 3-/5   Left Hip ABduction 3+/5   Right/Left Knee Left   Left Knee Flexion 2+/5   Left Knee Extension 3+/5   Right/Left Ankle Left   Left Ankle Dorsiflexion 3-/5     Transfers   Transfers Sit to Stand;Stand to Sit   Sit to Stand 5: Supervision;With upper extremity assist;From chair/3-in-1  chair without armrests using UEs on LEs to stabilize   Stand to Sit 5: Supervision;With upper extremity assist;To chair/3-in-1     Ambulation/Gait   Ambulation/Gait Yes   Ambulation/Gait Assistance 5: Supervision   Ambulation/Gait Assistance Details patient ambulates without an assistive device, but requires close supervision during tasks that require attention (talking), head turns, and pivot turns.   Ambulation Distance (Feet) 140 Feet  131ft x 3   Assistive device None   Gait Pattern Step-to pattern;Decreased arm swing - left;Decreased step length - right;Decreased stance time - left;Decreased stride length;Decreased dorsiflexion - left;Decreased weight shift to left;Ataxic;Trunk flexed;Abducted - left;Poor foot clearance - left  toed out bilaterally (L > R)    Ambulation Surface Level;Indoor   Gait velocity 2.55 ft/s  2.80ft/s at normal pace; 2.80ft/s at fastest pace    Stairs Yes   Stairs Assistance 4: Min guard   Stair Management Technique One rail Right;Alternating pattern;Forwards   Number of Stairs 4     Standardized Balance Assessment   Standardized Balance Assessment Berg Balance Test;Timed Up and Go Test     Berg Balance  Test   Sit to Stand Able to stand  independently using hands   Standing Unsupported Able to stand safely 2 minutes   Sitting with Back Unsupported but Feet Supported on Floor or Stool Able to sit safely and securely 2 minutes   Stand to Sit Sits safely with minimal use of hands   Transfers Able to transfer safely, minor use of hands   Standing Unsupported with Eyes Closed Able to stand 3 seconds   Standing Ubsupported with Feet Together Able to place feet together independently and stand 1 minute safely   From Standing, Reach Forward with Outstretched Arm Can reach forward >12 cm safely (5")   From Standing Position, Pick up  Object from Floor Able to pick up shoe, needs supervision   From Standing Position, Turn to Look Behind Over each Shoulder Turn sideways only but maintains balance   Turn 360 Degrees Needs close supervision or verbal cueing   Standing Unsupported, Alternately Place Feet on Step/Stool Able to complete >2 steps/needs minimal assist   Standing Unsupported, One Foot in Front Needs help to step but can hold 15 seconds   Standing on One Leg Tries to lift leg/unable to hold 3 seconds but remains standing independently   Total Score 37     Timed Up and Go Test   Normal TUG (seconds) 12.2   Cognitive TUG (seconds) 21.67  77% increase standard TUG   TUG Comments patient's gait velocity and fluidity greatly decline with the cognitive TUG relative to the normal TUG     Functional Gait  Assessment   Gait assessed  Yes   Gait Level Surface Walks 20 ft, slow speed, abnormal gait pattern, evidence for imbalance or deviates 10-15 in outside of the 12 in walkway width. Requires more than 7 sec to ambulate 20 ft.   Change in Gait Speed Makes only minor adjustments to walking speed, or accomplishes a change in speed with significant gait deviations, deviates 10-15 in outside the 12 in walkway width, or changes speed but loses balance but is able to recover and continue walking.   Gait  with Horizontal Head Turns Performs head turns with moderate changes in gait velocity, slows down, deviates 10-15 in outside 12 in walkway width but recovers, can continue to walk.   Gait with Vertical Head Turns Performs task with moderate change in gait velocity, slows down, deviates 10-15 in outside 12 in walkway width but recovers, can continue to walk.   Gait and Pivot Turn Turns slowly, requires verbal cueing, or requires several small steps to catch balance following turn and stop   Step Over Obstacle Cannot perform without assistance.   Gait with Narrow Base of Support Ambulates less than 4 steps heel to toe or cannot perform without assistance.   Gait with Eyes Closed Cannot walk 20 ft without assistance, severe gait deviations or imbalance, deviates greater than 15 in outside 12 in walkway width or will not attempt task.   Ambulating Backwards Walks 20 ft, slow speed, abnormal gait pattern, evidence for imbalance, deviates 10-15 in outside 12 in walkway width.   Steps Alternating feet, must use rail.   Total Score 8                           PT Education - 11/16/16 1310    Education provided Yes   Education Details plan of care, interpreation of Berg Balance score, fall risk    Person(s) Educated Patient   Methods Explanation;Verbal cues   Comprehension Verbalized understanding          PT Short Term Goals - 11/16/16 1330      PT SHORT TERM GOAL #1   Title Patient will verbalize understanding of initial HEP to decrease her risk of falling. (TARGET DATE: 12/17/2016)   Time 4   Period Weeks   Status New     PT SHORT TERM GOAL #2   Title Patient will achieve a cognitive TUG score of < or = 18 seconds in order to decrease her risk of falling. (TARGET DATE: 12/17/2016)   Time 4   Period Weeks   Status New     PT SHORT  TERM GOAL #3   Title Patient will demonstrate an ability to ambulate 500 feet with supervision and LRAD to indicate a decrease in her risk of  falling. (TARGET DATE: 12/17/2016)   Time 4   Period Weeks   Status New           PT Long Term Goals - 11/16/16 1334      PT LONG TERM GOAL #1   Title Patient will verbalize understanidng of HEP and ongoing fitness program. (TARGET DATE: 01/14/2017)    Time 8   Period Weeks   Status New     PT LONG TERM GOAL #2   Title Patient will achieve a score of >or = 45 to demonstrate a decrease in her risk of falling. (TARGET DATE: 01/14/2017)   Time 8   Period Weeks   Status New     PT LONG TERM GOAL #3   Title Patient will demonstrate ability to ambulate 1000 feet without AD including unlevel surfaces (grass, curbs, ramps) with supervision to indicate a return to patient's prior level of function. (TARGET DATE: 01/14/2017)   Time 8   Period Weeks   Status New     PT LONG TERM GOAL #4   Title Patient will acheive a functional gait assessment score of > or = 19 to indicate a decrease in her risk of falling. (TARGET DATE: 01/14/2017)   Time 8   Period Weeks   Status New     PT LONG TERM GOAL #5   Title Patient will achieve a cognitive TUG score of < or = 15.5 seconds to indicate a decrease in her risk of falling. (TARGET DATE: 01/14/2017)   Time 8   Period Weeks   Status New               Plan - 11/16/16 1322    Clinical Impression Statement Patient is a 66 year old female presenting to PT s/p R CVA on 10/11/2016 resulting in L sided numbness and weakness. Prior to the CVA, the patient was independent and worked part-time as a Theme park manager requiring stability in standing for extended periods of time and lifting/moving light weight objects. The patient has a past medical history that is significant for chest pain with exertion, type 2 DM (insulin dependent), alergic rhinitis, GERD, and arrhythmia. In today's PT evaluation, the patient has noted dsymetric and ataxic LLE motion and deficits in balance and mobility as noted by a Berg Balance score of 37/56, FGA score of 9/30, and a  cogntive TUG score of 21.67s compared to her normal TUG score of 12.2s. These both indicate she is at an increased risk of falling especially when completing cognitive tasks while ambulating. The patient reports she has a fear of falling that has caused her to decrease her activity level, which also increases her risk of falling. The patient's case is evolving and is of moderate complexity. The patient's prognosis is good should she receive skilled PT to address her deficits in balance and mobility.   Rehab Potential Good   Clinical Impairments Affecting Rehab Potential chest pain with exertion, type 2 DM, allergic rhinitis, GERD, arrhythmia    PT Frequency 2x / week   PT Duration 8 weeks   PT Treatment/Interventions ADLs/Self Care Home Management;Electrical Stimulation;DME Instruction;Gait training;Stair training;Functional mobility training;Therapeutic activities;Therapeutic exercise;Balance training;Patient/family education;Neuromuscular re-education;Manual techniques   PT Next Visit Plan initiate HEP, balance and gait exercises, educate on nutrition and digestion rates as it relates to patient's DM  Consulted and Agree with Plan of Care Patient      Patient will benefit from skilled therapeutic intervention in order to improve the following deficits and impairments:  Abnormal gait, Decreased activity tolerance, Decreased balance, Decreased knowledge of precautions, Decreased endurance, Decreased knowledge of use of DME, Decreased mobility, Decreased range of motion, Decreased safety awareness, Difficulty walking, Decreased strength, Impaired sensation, Impaired tone, Postural dysfunction, Pain  Visit Diagnosis: Other abnormalities of gait and mobility  Unsteadiness on feet  History of falling  Paralytic gait  Hemiplegia and hemiparesis following cerebral infarction affecting left non-dominant side (HCC)  Other lack of coordination      G-Codes - 12-16-16 1345    Functional  Assessment Tool Used (Outpatient Only) Functional Gait Assessment 8/30   Functional Limitation Mobility: Walking and moving around   Mobility: Walking and Moving Around Current Status [phone]) At least 60 percent but less than 80 percent impaired, limited or restricted   Mobility: Walking and Moving Around Goal Status [phone]) At least 20 percent but less than 40 percent impaired, limited or restricted       Problem List Patient Active Problem List   Diagnosis Date Noted  . Left leg weakness   . Cerebrovascular accident (CVA) ([place])   . Hyperlipidemia   . OSA (obstructive sleep apnea)   . Left-sided weakness 10/11/2016  . Orthostatic hypotension 09/18/2015  . Chest pain 08/15/2015  . Acute sinusitis 08/19/2014  . Yeast infection 08/19/2014  . Vitamin D deficiency 05/20/2014  . Gastro-esophageal reflux 03/20/2014  . Allergic rhinitis 07/09/2013  . Sleep disorder 07/09/2013  . Elevated blood pressure (not hypertension) 07/09/2013  . Mood disorder ([place]) 11/23/2011  . Diabetes mellitus ([place]) 11/23/2011  . Health care maintenance 11/23/2011    [place], SPT  16-Dec-2016, 3:46 PM  Jamey Reas, PT, DPT PT Specializing in [place] Dec 16, 2016 4:19 PM Phone:  [phone]  Fax:  [phone] [facility] [address] [place], [place] [address]   [facility] [address] [place], [place], [address] Phone: [phone]   Fax:  [phone]  Name: Wendy Mcclain MRN: [id number] Date of Birth: 03-17-1951

## 2016-11-18 ENCOUNTER — Ambulatory Visit: Payer: Medicare Other | Admitting: *Deleted

## 2016-11-18 ENCOUNTER — Encounter: Payer: Self-pay | Admitting: *Deleted

## 2016-11-18 ENCOUNTER — Ambulatory Visit: Payer: Medicare Other | Admitting: Physical Therapy

## 2016-11-18 ENCOUNTER — Ambulatory Visit: Payer: Medicare Other | Admitting: Speech Pathology

## 2016-11-18 ENCOUNTER — Encounter: Payer: Self-pay | Admitting: Physical Therapy

## 2016-11-18 DIAGNOSIS — R2689 Other abnormalities of gait and mobility: Secondary | ICD-10-CM

## 2016-11-18 DIAGNOSIS — I69318 Other symptoms and signs involving cognitive functions following cerebral infarction: Secondary | ICD-10-CM

## 2016-11-18 DIAGNOSIS — R278 Other lack of coordination: Secondary | ICD-10-CM

## 2016-11-18 DIAGNOSIS — R41841 Cognitive communication deficit: Secondary | ICD-10-CM

## 2016-11-18 DIAGNOSIS — R2681 Unsteadiness on feet: Secondary | ICD-10-CM

## 2016-11-18 DIAGNOSIS — I69354 Hemiplegia and hemiparesis following cerebral infarction affecting left non-dominant side: Secondary | ICD-10-CM | POA: Diagnosis not present

## 2016-11-18 DIAGNOSIS — R261 Paralytic gait: Secondary | ICD-10-CM | POA: Diagnosis not present

## 2016-11-18 DIAGNOSIS — R41842 Visuospatial deficit: Secondary | ICD-10-CM

## 2016-11-18 DIAGNOSIS — M6281 Muscle weakness (generalized): Secondary | ICD-10-CM

## 2016-11-18 DIAGNOSIS — Z9181 History of falling: Secondary | ICD-10-CM

## 2016-11-18 NOTE — Therapy (Signed)
[facility] [address] [place], [place], [address] Phone: [phone]   Fax:  [phone]  Physical Therapy Treatment  Patient Details  Name: Wendy Mcclain MRN: [id number] Date of Birth: October 30, 1950 Referring Provider: Ronnie Doss DO  Talbert Cage MD)  Encounter Date: 11/18/2016      PT End of Session - 11/18/16 1103    Visit Number 2   Number of Visits 17   Date for PT Re-Evaluation 01/14/17   Authorization Type Medicare & G-codes    PT Start Time 1102   PT Stop Time 1143   PT Time Calculation (min) 41 min   Equipment Utilized During Treatment Gait belt   Activity Tolerance Patient tolerated treatment well   Behavior During Therapy WFL for tasks assessed/performed      Past Medical History:  Diagnosis Date  . Arrhythmia   . Asthma   . Diabetes mellitus   . GERD (gastroesophageal reflux disease)     Past Surgical History:  Procedure Laterality Date  . ABDOMINAL HYSTERECTOMY  1993  . APPENDECTOMY  1963  . BREAST ENHANCEMENT SURGERY  1986    There were no vitals filed for this visit.      Subjective Assessment - 11/18/16 1103    Subjective No new complaints. No falls or pain to report.    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 No/denies   Pain Score 0-No pain            [facility] Adult PT Treatment/Exercise - 11/18/16 1107      Transfers   Transfers Sit to Stand;Stand to Sit   Sit to Stand 5: Supervision;With upper extremity assist;From chair/3-in-1   Stand to Sit 5: Supervision;With upper extremity assist;To chair/3-in-1     Ambulation/Gait   Ambulation/Gait  Yes   Ambulation/Gait Assistance 4: Min guard   Ambulation/Gait Assistance Details occasional foot scuffing noted, pt able to correct with cues.    Ambulation Distance (Feet) 500 Feet   Assistive device None   Gait Pattern Step-through pattern;Decreased stride length;Decreased step length - right;Decreased step length - left;Trunk flexed;Narrow base of support   Ambulation Surface Level;Unlevel;Indoor;Outdoor;Paved     issued the following to HEP today: WALKING  Walking is a great form of exercise to increase your strength, endurance and overall fitness.  A walking program can help you start slowly and gradually build endurance as you go.  Everyone's ability is different, so each person's starting point will be different.  You do not have to follow them exactly.  You should simply find out what's right for you and stick to that program.   In the beginning, you'll start off walking 1-2 times a day for short distances.  As you get stronger, you'll be walking further at just 1 time per day.  A. You Can Walk For A Certain Length Of Time Each Day    Walk 5 minutes 1-2 times per day.  Increase 1-2 minutes every 5-6 days.  Work up to 25-30 minutes (1 time per day).   Example:   Day 1-7 5 minutes up to2 times per day   Day 7-14 7-8 minutes 1-2  times per day   Day  14-21 10-12 minutes 1-2 times per day                         Follow this format till you reach 1 walk for 25-30 minutes each day  Please only do the exercises that your therapist has initialed and dated.  Perform these in corner with chair in front of you for safety: Feet Apart, Varied Arm Positions - Eyes Closed    Stand with feet shoulder width apart and arms as needed for balance. Close eyes and visualize upright position. Hold __20__ seconds. Hold onto chair or wall as needed for balance. Repeat _3_ times per session. Do _1-2_ sessions per day.  Copyright  VHI. All rights reserved.    Feet Apart, Head Motion - Eyes  Closed    With eyes closed and feet shoulder width apart,hold with one hand onto chair (light fingertip support for balance) while you move head slowly: 1. Up and down x 10 reps 2. Looking left and right x 10 reps 3. Looking in diagonal directions x 10 reps each way Do _1-2_ sessions per day.  Copyright  VHI. All rights reserved.          PT Education - 11/18/16 1135    Education provided Yes   Education Details HEP: added walking program and corner balance activities   Person(s) Educated Patient   Methods Explanation;Demonstration;Verbal cues;Handout;Tactile cues   Comprehension Verbalized understanding;Returned demonstration;Verbal cues required;Tactile cues required;Need further instruction          PT Short Term Goals - 11/16/16 1330      PT SHORT TERM GOAL #1   Title Patient will verbalize understanding of initial HEP to decrease her risk of falling. (TARGET DATE: 12/17/2016)   Time 4   Period Weeks   Status New     PT SHORT TERM GOAL #2   Title Patient will achieve a cognitive TUG score of < or = 18 seconds in order to decrease her risk of falling. (TARGET DATE: 12/17/2016)   Time 4   Period Weeks   Status New     PT SHORT TERM GOAL #3   Title Patient will demonstrate an ability to ambulate 500 feet with supervision and LRAD to indicate a decrease in her risk of falling. (TARGET DATE: 12/17/2016)   Time 4   Period Weeks   Status New           PT Long Term Goals - 11/16/16 1334      PT LONG TERM GOAL #1   Title Patient will verbalize understanidng of HEP and ongoing fitness program. (TARGET DATE: 01/14/2017)    Time 8   Period Weeks   Status New     PT LONG TERM GOAL #2   Title Patient will achieve a score of >or = 45 to demonstrate a decrease in her risk of falling. (TARGET DATE: 01/14/2017)   Time 8   Period Weeks   Status New     PT LONG TERM GOAL #3   Title Patient will demonstrate ability to ambulate 1000 feet without AD including unlevel  surfaces (grass, curbs, ramps) with supervision to indicate a return to patient's prior level of function. (TARGET DATE: 01/14/2017)   Time 8   Period Weeks   Status New     PT LONG TERM GOAL #4   Title Patient will acheive a functional gait assessment score of > or = 19 to indicate a decrease in her risk  of falling. (TARGET DATE: 01/14/2017)   Time 8   Period Weeks   Status New     PT LONG TERM GOAL #5   Title Patient will achieve a cognitive TUG score of < or = 15.5 seconds to indicate a decrease in her risk of falling. (TARGET DATE: 01/14/2017)   Time 8   Period Weeks   Status New            Plan - 11/18/16 1104    Clinical Impression Statement Today's skilled session addressed setting up HEP for pt to address balance and activity tolerance. Pt able to perform in session without any issues. Pt should benefit from continued PT to progress toward unmet goals.    Rehab Potential Good   Clinical Impairments Affecting Rehab Potential chest pain with exertion, type 2 DM, allergic rhinitis, GERD, arrhythmia    PT Frequency 2x / week   PT Duration 8 weeks   PT Treatment/Interventions ADLs/Self Care Home Management;Electrical Stimulation;DME Instruction;Gait training;Stair training;Functional mobility training;Therapeutic activities;Therapeutic exercise;Balance training;Patient/family education;Neuromuscular re-education;Manual techniques   PT Next Visit Plan continue to work on balance and dynamic gait; add to HEP as needed.    Consulted and Agree with Plan of Care Patient      Patient will benefit from skilled therapeutic intervention in order to improve the following deficits and impairments:  Abnormal gait, Decreased activity tolerance, Decreased balance, Decreased knowledge of precautions, Decreased endurance, Decreased knowledge of use of DME, Decreased mobility, Decreased range of motion, Decreased safety awareness, Difficulty walking, Decreased strength, Impaired sensation, Impaired  tone, Postural dysfunction, Pain  Visit Diagnosis: Other abnormalities of gait and mobility  Unsteadiness on feet  History of falling     Problem List Patient Active Problem List   Diagnosis Date Noted  . Left leg weakness   . Cerebrovascular accident (CVA) ([place])   . Hyperlipidemia   . OSA (obstructive sleep apnea)   . Left-sided weakness 10/11/2016  . Orthostatic hypotension 09/18/2015  . Chest pain 08/15/2015  . Acute sinusitis 08/19/2014  . Yeast infection 08/19/2014  . Vitamin D deficiency 05/20/2014  . Gastro-esophageal reflux 03/20/2014  . Allergic rhinitis 07/09/2013  . Sleep disorder 07/09/2013  . Elevated blood pressure (not hypertension) 07/09/2013  . Mood disorder ([place]) 11/23/2011  . Diabetes mellitus ([place]) 11/23/2011  . Health care maintenance 11/23/2011    Willow Ora, PTA, [facility] [address] [place], [place] [address] [phone] 11/18/16, 12:57 PM   Name: Wendy Mcclain MRN: [id number] Date of Birth: [birth year]

## 2016-11-18 NOTE — Therapy (Signed)
[facility] [address] [place], [place], [address] Phone: [phone]   Fax:  [phone]  Speech Language Pathology Evaluation  Patient Details  Name: Wendy Mcclain MRN: [id number] Date of Birth: 1951-07-03 Referring Provider: Janora Norlander, DO  Encounter Date: 11/18/2016      End of Session - 11/18/16 1242    Visit Number 1   Number of Visits 17   Date for SLP Re-Evaluation 01/14/17   SLP Start Time 1148   SLP Stop Time  1230   SLP Time Calculation (min) 42 min   Activity Tolerance Patient tolerated treatment well      Past Medical History:  Diagnosis Date  . Arrhythmia   . Asthma   . Diabetes mellitus   . GERD (gastroesophageal reflux disease)     Past Surgical History:  Procedure Laterality Date  . ABDOMINAL HYSTERECTOMY  1993  . APPENDECTOMY  1963  . BREAST ENHANCEMENT SURGERY  1986    There were no vitals filed for this visit.      Subjective Assessment - 11/18/16 1150    Subjective "i did great in the hospital but then i got home and i'm doing worse than i did,"    Currently in Pain? No/denies            SLP Evaluation [facility] - 11/18/16 1150      SLP Visit Information   SLP Received On 11/18/16   Referring Provider Janora Norlander, DO   Onset Date 10/15/16   Medical Diagnosis I63.311 (ICD-10-CM) - Cerebrovascular accident (CVA) due to thrombosis of right middle cerebral artery     Subjective   Patient/Family Stated Goal "just be normal"     General Information   HPI 66 yo female adm to [facility] with left leg and arm weakness/sensory changes. Pt imaging study showed right caudate lacunar CVA. Evaluated by SLP during acute stay, scored 29/30 on MOCA form 8.1, no SLP follow-up was recommended. Patient reports difficulties, "Trying to find a word and say the word sometimes, losing my train of thought, concentrating."   Behavioral/Cognition Alert, cooperative, pleasant   Mobility  Status ambulated to session independently     Prior Functional Status   Cognitive/Linguistic Baseline Within functional limits   Type of [place];Available PRN/intermittently   Education GED, licensed cosmetology, and real estate   Vocation Retired     Associate Professor   Overall Cognitive Status Impaired/Different from baseline   Area of Impairment Attention;Memory   Current Attention Level Focused   Memory Decreased short-term memory   Memory Comments story recall 9/18   Attention Sustained;Selective   Sustained Attention Impaired   Sustained Attention Impairment Verbal basic;Functional basic   Selective Attention Impaired   Selective Attention Impairment Verbal basic;Functional basic   Memory Impaired   Memory Impairment Decreased recall of new information;Decreased short term memory;Storage deficit   Decreased Short Term Memory Verbal basic;Functional basic   Awareness Appears intact   Problem Solving Appears intact   Executive Function Reasoning;Organizing;Decision Making;Sequencing;Initiating;Self Monitoring;Self Correcting   Reasoning Appears intact   Sequencing Appears intact   Organizing Appears intact   Decision Making Appears intact   Initiating Appears intact   Self Monitoring Impaired   Self Monitoring Impairment Verbal complex;Functional complex   Self Correcting Impaired   Self Correcting Impairment Verbal complex;Functional complex     Auditory Comprehension   Overall Auditory Comprehension Appears within functional limits for  tasks assessed   Yes/No Questions Within Functional Limits   Commands Within Functional Limits   Conversation Moderately complex   Interfering Components Attention;Working Systems developer time;Other (Comment)  Written information     Visual Recognition/Discrimination   Discrimination Within Function Limits     Reading Comprehension   Reading Status Within  funtional limits     Expression   Primary Mode of Expression Verbal     Verbal Expression   Overall Verbal Expression Impaired   Initiation No impairment   Automatic Speech Name;Social Response   Level of Generative/Spontaneous Verbalization Conversation   Repetition No impairment   Naming Impairment   Responsive 76-100% accurate   Confrontation 75-100% accurate   Convergent 75-100% accurate   Divergent Not tested   Other Naming Comments higher-level wordfinding deficits in conversation, circumlocutions noted   Verbal Errors Aware of errors   Pragmatics No impairment   Interfering Components Attention   Effective Techniques Semantic cues;Open ended questions   Non-Verbal Means of Communication Not applicable     Written Expression   Dominant Hand Right   Written Expression Not tested     Oral Motor/Sensory Function   Overall Oral Motor/Sensory Function Appears within functional limits for tasks assessed     Motor Speech   Overall Motor Speech Appears within functional limits for tasks assessed     Standardized Assessments   Standardized Assessments  Cognitive Linguistic Quick Test     Cognitive Linguistic Quick Test (Ages 18-69)   Attention Mild   Memory Mild   Executive Function WNL   Language WNL   Visuospatial Skills WNL   Severity Rating Total 18   Composite Severity Rating 14.8                         SLP Education - 11/18/16 1241    Education provided Yes   Education Details Reviewed SLP evaluation findings, deficit areas in memory, attention, compensatory strategies, plan of care recommendations   Person(s) Educated Patient   Methods Explanation;Handout   Comprehension Verbalized understanding          SLP Short Term Goals - 11/18/16 1934      SLP SHORT TERM GOAL #1   Title Pt will attend to details in mildly complex cognitive linguistic tasks with 85% accuracy and occasional min A   Time 4   Period Weeks   Status New     SLP  SHORT TERM GOAL #2   Title Pt will complete various naming tasks of increasing complexity with 90% success over 2 sessions, given mod assist   Time 4   Period Weeks   Status New     SLP SHORT TERM GOAL #3   Title Pt will implement and use a system for functional recall of daily and scheduled activities with mod assistance over 2 sessions   Time 4   Period Weeks   Status New          SLP Long Term Goals - 11/18/16 1934      SLP LONG TERM GOAL #1   Title Pt will alternate attention between 2 mildly complex cognitive linguistic tasks with 85% on each and occasional min A   Time 8   Period Weeks   Status New     SLP LONG TERM GOAL #2   Title Pt will complete Level III (abstract) naming tasks with 90% accuracy without cues   Time 8   Period Weeks  Status New     SLP LONG TERM GOAL #3   Title Pt will report daily functional and effective use of compensatory strategies for recall of daily activities and appointments.    Time 8   Period Weeks   Status New     SLP LONG TERM GOAL #4   Title pt will tell SLP 4 memory strategies over two sessions with modified independence   Time 8   Period Weeks   Status New          Plan - Dec 08, 2016 1257    Clinical Impression Statement Patient presents with mild cognitive-linguistic impairment with deficits in attention, memory and wordfinding. Administered Cognitive Linguistic Quick Test; patient was cooperative during testing. She had difficulty with attention tasks including symbol cancellation, cancelling 2 incorrect symbols and overlooking 1 correct symbol. Story retelling 9/18 after which patient remarked, "I couldn't do the words." She is noted with intermittent word-finding problems and circumlocutory speech. She states her grandson often tells her she "doesn't make sense." Design generation notable for 5/13 total designs which were either perseverated or copied from the clinician's demonstration. Patient does seem aware of her deficits,  which she described independently prior to testing. Recommend skilled ST to address attention, memory and wordfinding deficits.   Speech Therapy Frequency 2x / week   Duration Other (comment)  8 weeks or 16 visits   Treatment/Interventions Language facilitation;Environmental controls;Cueing hierarchy;SLP instruction and feedback;Compensatory techniques;Cognitive reorganization;Functional tasks;Multimodal communcation approach;Patient/family education;Internal/external aids   Potential to Achieve Goals Good   SLP Home Exercise Plan review attention/memory strategies worksheet, carry a notebook   Consulted and Agree with Plan of Care Patient      Patient will benefit from skilled therapeutic intervention in order to improve the following deficits and impairments:   Cognitive communication deficit      G-Codes - 2016-12-08 1306    Functional Assessment Tool Used NOMS 5   Functional Limitations Attention   Attention Current Status (O2703) At least 20 percent but less than 40 percent impaired, limited or restricted   Attention Goal Status (J0093) At least 1 percent but less than 20 percent impaired, limited or restricted      Problem List Patient Active Problem List   Diagnosis Date Noted  . Left leg weakness   . Cerebrovascular accident (CVA) ([place])   . Hyperlipidemia   . OSA (obstructive sleep apnea)   . Left-sided weakness 10/11/2016  . Orthostatic hypotension 09/18/2015  . Chest pain 08/15/2015  . Acute sinusitis 08/19/2014  . Yeast infection 08/19/2014  . Vitamin D deficiency 05/20/2014  . Gastro-esophageal reflux 03/20/2014  . Allergic rhinitis 07/09/2013  . Sleep disorder 07/09/2013  . Elevated blood pressure (not hypertension) 07/09/2013  . Mood disorder ([place]) 11/23/2011  . Diabetes mellitus ([place]) 11/23/2011  . Health care maintenance 11/23/2011   Deneise Lever, MS CF-SLP Speech-Language Pathologist  Aliene Altes 08-Dec-2016, 7:35 PM  [facility] [address] [place], [place], [address] Phone: [phone]   Fax:  [phone]  Name: Wendy Mcclain MRN: [id number] Date of Birth: Jun 13, 1951

## 2016-11-18 NOTE — Therapy (Signed)
[facility] [address] [place], [place], [address] Phone: [phone]   Fax:  [phone]  Occupational Therapy Evaluation  Patient Details  Name: Wendy Mcclain MRN: [id number] Date of Birth: Sep 19, 1950 Referring Provider: Dr Ronnie Doss, DO  Encounter Date: 11/18/2016      OT End of Session - 11/18/16 1210    Visit Number 1   Number of Visits 17   Date for OT Re-Evaluation 01/18/17   Authorization Type Medicare -    Authorization Time Period G code and progress note every 10th visit.   Authorization - Visit Number 1   Authorization - Number of Visits 10   OT Start Time 1008   OT Stop Time 1058   OT Time Calculation (min) 50 min   Activity Tolerance Patient tolerated treatment well   Behavior During Therapy WFL for tasks assessed/performed      Past Medical History:  Diagnosis Date  . Arrhythmia   . Asthma   . Diabetes mellitus   . GERD (gastroesophageal reflux disease)     Past Surgical History:  Procedure Laterality Date  . ABDOMINAL HYSTERECTOMY  1993  . APPENDECTOMY  1963  . BREAST ENHANCEMENT SURGERY  1986    There were no vitals filed for this visit.      Subjective Assessment - 11/18/16 1012    Subjective  Pt reports that on 10/11/16 and was not able to move her left leg. She reported numbness. She is s/p R caudate lacunar infarct CVA.    Pertinent History Chest pain with excertion; type 2 DM (insulin dependent); Allergic rhinitis; GERD; Arrhythmia. 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 Yes   Pain Score 5    Pain Location Generalized  Left sided numbness nore than pain   Pain Orientation Left   Pain Descriptors / Indicators Tingling;Numbness   Pain Type  Acute pain   Pain Onset More than a month ago   Pain Frequency Intermittent   Pain Relieving Factors Rubbing her arm; rest etc.   Effect of Pain on Daily Activities Unable to currently work as part-time Theme park manager (may retire per pt report).           [facility] OT Assessment - 11/18/16 0001      Assessment   Diagnosis CVA    Referring Provider Dr Ronnie Doss, DO   Onset Date 10/11/16   Assessment Pt is s/p R Caudate lacunar infarct CVA on 10/11/16     Precautions   Precautions Fall   Precaution Comments Pt drives   States she was educated to not drive until May     Balance Screen   Has the patient fallen in the past 6 months Yes   How many times? 1   Has the patient had a decrease in activity level because of a fear of falling?  Yes   Is the patient reluctant to leave their home because of a fear of falling?  No     Prior Function   Level of Independence Independent   Vocation Part time employment  Pt reports "I am 66 and I might retire, not go back"   Vocation Requirements bending and lifting light weight, standing tolerance    Leisure getting out with grandchildren (teenagers), attending their sporting  events      ADL   Eating/Feeding Modified independent  "I take my time"   Grooming Minimal assistance  Pt lives alone - daughter checks on her.   Lower Body Bathing Minimal assistance   Upper Body Dressing Increased time   Lower Body Dressing Minimal assistance   Toilet Tranfer Supervision/safety  Has grab bar; Lives Alone   Toileting - Clothing Manipulation Increased time  Increased time for pull up clothing, Min A for fasteners   Toileting -  Hygiene Increase time   Tub/Shower Transfer Minimal assistance   ADL comments Pt lives alone. Daughter has been assisting with ADL's and bathing etc PRN secondary to balance issues ("I can't bend over"). Pt is also driving, she states that she was educated to not drive "until May, but I have to" discussed importance of not  driving until cleared by MD.     IADL   Prior Level of Function Shopping Independent   Light Housekeeping All laundry must be done by others;Needs help with all home maintenance tasks   Meal Prep Does not utilize stove or oven;Needs to have meals prepared and served  Gets a cup of coffe or drink, snack etc but daughter Scientist, research (physical sciences) own vehicle  Pt is driving even though MD stated "do not drive until May"   Medication Management Takes responsibility if medication is prepared in advance in seperate dosage   Prior Level of Function Financial Management Was independent   Financial Management Requires assistance     Mobility   Mobility Status Comments Ambulates w/o AD     Written Expression   Dominant Hand Right     Vision - History   Baseline Vision Wears glasses only for reading   Visual History Other (comment)   Patient Visual Report Other (comment)  Reports increased watering, dark spot etc Left eye   Additional Comments Has had cataract surgery in 59s; Dark spots on L > R; has had laser surgery; h/o type 2 DM     Vision Assessment   Vision Assessment Vision impaired  _ to be further tested in functional context   Comment Pt reports that she doesn't "stay as focused anymore" reports increased watering from left eye, some blurred vision and "dark spots that some and go" more on left eye than right. Need to further assess in functional context     Activity Tolerance   Activity Tolerance --  Decreased activity tolerance and attention per pt report   Activity Tolerance Comments Overall decreassed activity tolerance per pt report. Lives alone, daughter assists with ADL's and home making. Decreased balance, requires frequent rest breaks per pt report     Cognition   Overall Cognitive Status Impaired/Different from baseline   Mini Mental State Exam  --  TBA   Attention --  Impaired - difficulty staying on task   Memory --  Cont to assess in functional context -  recommend Mini mental     Observation/Other Assessments   Other Surveys  Select     Sensation   Light Touch Appears Intact  Reports overal tingling/light numbness LUE and LLE     Coordination   Gross Motor Movements are Fluid and Coordinated No   Fine Motor Movements are Fluid and Coordinated No   9 Hole Peg Test Right;Left   Right 9 Hole Peg Test 24.28 seconds   Left 9 Hole Peg Test 34.81 seconds   Coordination Ataxia noted with coordination activities  Edema   Edema None noted LUE     ROM / Strength   AROM / PROM / Strength AROM;Strength     AROM   Overall AROM  Within functional limits for tasks performed   Overall AROM Comments End range stiffness with shoulder flexion and when attempting to put hands behind her head to simulate washing her hair  Bilateral UE's     Hand Function   Right Hand Grip (lbs) 29   Right Hand Lateral Pinch 9 lbs   Right Hand 3 Point Pinch 8 lbs   Left Hand Grip (lbs) 4   Left Hand Lateral Pinch 2 lbs   Left 3 point pinch 3 lbs   Comment Left UE generalized weakness noted                          OT Education - 11/18/16 1208    Education provided Yes   Education Details Reviewed findings from OT assessment, recommendations for plan of care and future vision assessment and further cognition assessment (Mini mental) recommended.   Person(s) Educated Patient   Methods Explanation;Verbal cues   Comprehension Verbalized understanding          OT Short Term Goals - 11/18/16 1225      OT SHORT TERM GOAL #1   Title Pt will be Mod I HEP for coordaination LUE (Target date: 12/16/16)   Time 4   Period Weeks   Status New     OT SHORT TERM GOAL #2   Title Pt will be Mod I strengthening HEP for LUE (Target date 12/16/16)   Time 4   Period Weeks   Status New     OT SHORT TERM GOAL #3   Title Pt will be supervision simple functional reach mid level using L UE as assist to dominant RUE w/o LOB during simulated ADL's    Time 4   Period Weeks   Status New           OT Long Term Goals - 11/18/16 1229      OT LONG TERM GOAL #1   Title Pt wil ldemonstrate improved coordination as seen by improved 9 hole peg test score left of 28 seconds or less & Mod I fastening buttons/fasteners when dressing.   Baseline 11/18/16: L = 34.81 seconds, R = 24.28 seconds; Min A fastening buttons, tying shoes etc.   Time 8   Period Weeks   Status New     OT LONG TERM GOAL #2   Title Pt will demonstrate improved LUE grip strength as seen by JAMAR grip assessment L of 15# or greater   Baseline 11/18/16 L = 4# vs R = 29# via JAMAR grip position II   Time 8   Period Weeks   Status New     OT LONG TERM GOAL #3   Title Pt will be Mod I simple snack prep in ADL kitchen w/o LOB   Time 8   Period Weeks   Status New     OT LONG TERM GOAL #4   Title Pt will be supervision simple bill pay, check writing   Time 8   Period Weeks   Status New     OT LONG TERM GOAL #5   Title Pt will be supervision memory strategies and cognitive compensatory strategies during ADL tasks   Time 8   Period Weeks   Status New     Long Term Additional Goals  Additional Long Term Goals Yes     OT LONG TERM GOAL #6   Title Pt will be Mod I compensatory strategies due to visual changes during ADL/homemaking activities in ADL kitchen   Time 8   Period Weeks   Status New               Plan - 11/18/16 1213    Clinical Impression Statement Pt is a pleasant 66 y/o RHD female s/p R caudate lacunar infarct CVA on 10/11/16 with resulting left sided weakness and c/o numbness. Prior to this CVA, she was working part-time as a Theme park manager and was independent with all ADL and IADL tasks, she lives alone. She currently demonstrates deficits in areas of strength, coordination, sensibility, decreased cognition and reports of visual changes as well. She states that she is currently getting assistance with most ADL's from her daughter whom has been  chaecking on her intermittently & is preparing meals, assisting with LB bathing, medication preparation and homemaking secondary to balance, vision and cognitive changes. She states that she may not return to work and may retire as she is unable to work. Of note, she is driving despite many challenges to balance, cognition and states that her MD "told me not to drive until at least May, but I have to". Pt was educated on importance of not driving until medically cleared by MD, but she did drive here alone today for PT, OT and SLP appointments. Pt should benefit from out-pt OT to address deficits and assist with maximizing independence with ADL's and IADL's as well as, further assessment of cognition and visual changes.   Rehab Potential Fair   OT Frequency 2x / week   OT Duration 8 weeks   OT Treatment/Interventions Self-care/ADL training;Therapeutic exercise;Cognitive remediation/compensation;Neuromuscular education;Visual/perceptual remediation/compensation;Energy conservation;Therapist, nutritional;Therapeutic exercises;Patient/family education;DME and/or AE instruction;Therapeutic activities  Balance to be addressed in context of ADL's and functional activities   Plan Mini mental exam, furhter assess vision in functional context; HEP for coordination LUE.   Consulted and Agree with Plan of Care Patient  No family member present      Patient will benefit from skilled therapeutic intervention in order to improve the following deficits and impairments:  Decreased cognition, Decreased knowledge of use of DME, Pain, Decreased coordination, Decreased mobility, Impaired sensation, Decreased strength, Decreased endurance, Decreased activity tolerance, Decreased range of motion, Decreased balance, Decreased knowledge of precautions, Decreased safety awareness, Impaired perceived functional ability  Visit Diagnosis: Other lack of coordination - Plan: Ot plan of care cert/re-cert  Muscle weakness  (generalized) - Plan: Ot plan of care cert/re-cert  Hemiplegia and hemiparesis following cerebral infarction affecting left non-dominant side ([place]) - Plan: Ot plan of care cert/re-cert  Other abnormalities of gait and mobility - Plan: Ot plan of care cert/re-cert  Other symptoms and signs involving cognitive functions following cerebral infarction - Plan: Ot plan of care cert/re-cert  Visuospatial deficit - Plan: Ot plan of care cert/re-cert      G-Codes - 67/59/16 1243    Functional Assessment Tool Used (Outpatient only) Clinical judgement; 9 hole peg test, grip assessment   Functional Limitation Carrying, moving and handling objects   Carrying, Moving and Handling Objects Current Status (B8466) At least 60 percent but less than 80 percent impaired, limited or restricted   Carrying, Moving and Handling Objects Goal Status (Z9935) At least 20 percent but less than 40 percent impaired, limited or restricted      Problem List Patient Active Problem List  Diagnosis Date Noted  . Left leg weakness   . Cerebrovascular accident (CVA) ([place])   . Hyperlipidemia   . OSA (obstructive sleep apnea)   . Left-sided weakness 10/11/2016  . Orthostatic hypotension 09/18/2015  . Chest pain 08/15/2015  . Acute sinusitis 08/19/2014  . Yeast infection 08/19/2014  . Vitamin D deficiency 05/20/2014  . Gastro-esophageal reflux 03/20/2014  . Allergic rhinitis 07/09/2013  . Sleep disorder 07/09/2013  . Elevated blood pressure (not hypertension) 07/09/2013  . Mood disorder ([place]) 11/23/2011  . Diabetes mellitus ([place]) 11/23/2011  . Health care maintenance 11/23/2011    Almyra Deforest, OTR/L 11/18/2016, 12:53 PM  [facility] [address] [place], [place], [address] Phone: [phone]   Fax:  [phone]  Name: Wendy Mcclain MRN: [id number] Date of Birth: September 09, 1950

## 2016-11-18 NOTE — Patient Instructions (Signed)
  Memory Strategies  W - Write it down  A - Associate it with something  R - Repeat it  M - Mental Image     Play the memory game  Try to remember 3-5 items on your store list without looking  Study a detailed picture in a magazine for 1 minute, then write down everything you can remember from the picture

## 2016-11-18 NOTE — Patient Instructions (Addendum)
WALKING  Walking is a great form of exercise to increase your strength, endurance and overall fitness.  A walking program can help you start slowly and gradually build endurance as you go.  Everyone's ability is different, so each person's starting point will be different.  You do not have to follow them exactly.  You should simply find out what's right for you and stick to that program.   In the beginning, you'll start off walking 1-2 times a day for short distances.  As you get stronger, you'll be walking further at just 1 time per day.  A. You Can Walk For A Certain Length Of Time Each Day    Walk 5 minutes 1-2 times per day.  Increase 1-2 minutes every 5-6 days.  Work up to 25-30 minutes (1 time per day).   Example:   Day 1-7 5 minutes up to2 times per day   Day 7-14 7-8 minutes 1-2  times per day   Day 14-21 10-12 minutes 1-2 times per day                         Follow this format till you reach 1 walk for 25-30 minutes each day  Please only do the exercises that your therapist has initialed and dated.  Perform these in corner with chair in front of you for safety: Feet Apart, Varied Arm Positions - Eyes Closed    Stand with feet shoulder width apart and arms as needed for balance. Close eyes and visualize upright position. Hold __20__ seconds. Hold onto chair or wall as needed for balance. Repeat _3_ times per session. Do _1-2_ sessions per day.  Copyright  VHI. All rights reserved.    Feet Apart, Head Motion - Eyes Closed    With eyes closed and feet shoulder width apart,hold with one hand onto chair (light fingertip support for balance) while you move head slowly: 1. Up and down x 10 reps 2. Looking left and right x 10 reps 3. Looking in diagonal directions x 10 reps each way Do _1-2_ sessions per day.  Copyright  VHI. All rights reserved.

## 2016-11-23 ENCOUNTER — Encounter: Payer: Medicare Other | Admitting: Speech Pathology

## 2016-11-23 ENCOUNTER — Encounter: Payer: Medicare Other | Admitting: Occupational Therapy

## 2016-11-24 ENCOUNTER — Encounter: Payer: Self-pay | Admitting: Physical Therapy

## 2016-11-24 ENCOUNTER — Ambulatory Visit: Payer: Medicare Other

## 2016-11-24 ENCOUNTER — Ambulatory Visit: Payer: Medicare Other | Admitting: Physical Therapy

## 2016-11-24 ENCOUNTER — Ambulatory Visit: Payer: Medicare Other | Admitting: Occupational Therapy

## 2016-11-24 ENCOUNTER — Ambulatory Visit (INDEPENDENT_AMBULATORY_CARE_PROVIDER_SITE_OTHER): Payer: Medicare Other | Admitting: Neurology

## 2016-11-24 DIAGNOSIS — R2689 Other abnormalities of gait and mobility: Secondary | ICD-10-CM

## 2016-11-24 DIAGNOSIS — Z9181 History of falling: Secondary | ICD-10-CM | POA: Diagnosis not present

## 2016-11-24 DIAGNOSIS — R278 Other lack of coordination: Secondary | ICD-10-CM | POA: Diagnosis not present

## 2016-11-24 DIAGNOSIS — R41841 Cognitive communication deficit: Secondary | ICD-10-CM

## 2016-11-24 DIAGNOSIS — R261 Paralytic gait: Secondary | ICD-10-CM | POA: Diagnosis not present

## 2016-11-24 DIAGNOSIS — G4710 Hypersomnia, unspecified: Secondary | ICD-10-CM

## 2016-11-24 DIAGNOSIS — R2681 Unsteadiness on feet: Secondary | ICD-10-CM | POA: Diagnosis not present

## 2016-11-24 DIAGNOSIS — G4720 Circadian rhythm sleep disorder, unspecified type: Secondary | ICD-10-CM

## 2016-11-24 DIAGNOSIS — R41842 Visuospatial deficit: Secondary | ICD-10-CM

## 2016-11-24 DIAGNOSIS — I69354 Hemiplegia and hemiparesis following cerebral infarction affecting left non-dominant side: Secondary | ICD-10-CM | POA: Diagnosis not present

## 2016-11-24 NOTE — Patient Instructions (Signed)
WALKING   Walking is a great form of exercise to increase your strength, endurance and overall fitness.  A walking program can help you start slowly and gradually build endurance as you go.  Everyone's ability is different, so each person's starting point will be different.  You do not have to follow them exactly.  You should simply find out what's right for you and stick to that program.   In the beginning, you'll start off walking 1-2 times a day for short distances.  As you get stronger, you'll be walking further at just 1 time per day.  A. You Can Walk For A Certain Length Of Time Each Day     11/24/16 Verbally reviewed    Walk 5 minutes 1-2 times per day.  Increase 1-2 minutes every 5-6 days.  Work up to 25-30 minutes (1 time per day).   Example:   Day 1-7 5 minutes up to2 times per day   Day 7-14 7-8 minutes 1-2  times per day   Day 14-21 10-12 minutes 1-2 times per day                         Follow this format till you reach 1 walk for 25-30 minutes each day  Please only do the exercises that your therapist has initialed and dated.  Perform these in corner with chair in front of you for safety: 11/24/16  Performed as written with min cues and intermittent UE support. Feet Apart, Varied Arm Positions - Eyes Closed    Stand with feet shoulder width apart and arms as needed for balance. Close eyes and visualize upright position. Hold __20__ seconds. Hold onto chair or wall as needed for balance. Repeat _3_ times per session. Do _1-2_ sessions per day.  Copyright  VHI. All rights reserved.    Feet Apart, Head Motion - Eyes Closed    With eyes closed and feet shoulder width apart,hold with one hand onto chair (light fingertip support for balance) while you move head slowly: 1. Up and down x 10 reps 2. Looking left and right x 10 reps 3. Looking in diagonal directions x 10 reps each way Do _1-2_ sessions per day.  Copyright  VHI. All rights reserved.

## 2016-11-24 NOTE — Therapy (Signed)
[facility] [address] [place], [place], [address] Phone: [phone]   Fax:  [phone]  Physical Therapy Treatment  Patient Details  Name: Wendy Mcclain MRN: [id number] Date of Birth: 1951/02/27 Referring Provider: Ronnie Doss DO  Talbert Cage MD)  Encounter Date: 11/24/2016      PT End of Session - 11/24/16 1100    Visit Number 3   Number of Visits 17   Date for PT Re-Evaluation 01/14/17   Authorization Type Medicare & G-codes    PT Start Time 1016/11/09   PT Stop Time 1058   PT Time Calculation (min) 40 min   Behavior During Therapy [facility] for tasks assessed/performed      Past Medical History:  Diagnosis Date  . Arrhythmia   . Asthma   . Diabetes mellitus   . GERD (gastroesophageal reflux disease)     Past Surgical History:  Procedure Laterality Date  . ABDOMINAL HYSTERECTOMY  1993  . APPENDECTOMY  1963  . BREAST ENHANCEMENT SURGERY  1986    There were no vitals filed for this visit.      Subjective Assessment - 11/24/16 1020    Subjective Pt worked on [employer] and some of the balance exercises from HEP   Currently in Pain? Yes   Pain Score 6    Pain Location Other (Comment)  Left side, UE/LE   Pain Orientation Left   Pain Descriptors / Indicators Sore   Pain Type Chronic pain   Pain Onset More than a month ago   Pain Frequency Intermittent           Perform these in corner with chair in front of you for safety: 11/24/16  Performed as written with min cues and intermittent UE support. Feet Apart, Varied Arm Positions - Eyes Closed    Stand with feet shoulder width apart and arms as needed for balance. Close eyes and visualize upright position. Hold __20__ seconds. Hold onto chair or wall as needed for balance. Repeat _3_ times per session. Do _1-2_ sessions per day.  Copyright  VHI. All rights reserved.    Feet Apart, Head Motion - Eyes Closed    With eyes  closed and feet shoulder width apart,hold with one hand onto chair (light fingertip support for balance) while you move head slowly: 1. Up and down x 10 reps 2. Looking left and right x 10 reps 3. Looking in diagonal directions x 10 reps each way Do _1-2_ sessions per day.  Copyright  VHI. All rights reserved.                  [facility] Adult PT Treatment/Exercise - 11/24/16 0001      Ambulation/Gait   Ambulation/Gait Yes   Ambulation/Gait Assistance 5: Supervision   Ambulation/Gait Assistance Details working on initial heel strike   Ambulation Distance (Feet) 500 Feet   Assistive device None   Gait Pattern Step-through pattern;Decreased stride length;Decreased step length - right;Decreased step length - left;Trunk flexed;Narrow base of support   Ambulation Surface Level;Indoor             Balance Exercises - 11/24/16 1041      Balance Exercises: Standing   Standing, One Foot on a Step Eyes open;Head turns;4 inch  alt foot taps progessing with head turns   Step Ups Forward;4 inch  min guard   Gait with Head Turns Forward   Retro Gait 2 reps   Sidestepping 4 reps   Marching Limitations  x4 without UE support             PT Short Term Goals - 11/16/16 1330      PT SHORT TERM GOAL #1   Title Patient will verbalize understanding of initial HEP to decrease her risk of falling. (TARGET DATE: 12/17/2016)   Time 4   Period Weeks   Status New     PT SHORT TERM GOAL #2   Title Patient will achieve a cognitive TUG score of < or = 18 seconds in order to decrease her risk of falling. (TARGET DATE: 12/17/2016)   Time 4   Period Weeks   Status New     PT SHORT TERM GOAL #3   Title Patient will demonstrate an ability to ambulate 500 feet with supervision and LRAD to indicate a decrease in her risk of falling. (TARGET DATE: 12/17/2016)   Time 4   Period Weeks   Status New           PT Long Term Goals - 11/16/16 1334      PT LONG TERM GOAL #1   Title  Patient will verbalize understanidng of HEP and ongoing fitness program. (TARGET DATE: 01/14/2017)    Time 8   Period Weeks   Status New     PT LONG TERM GOAL #2   Title Patient will achieve a score of >or = 45 to demonstrate a decrease in her risk of falling. (TARGET DATE: 01/14/2017)   Time 8   Period Weeks   Status New     PT LONG TERM GOAL #3   Title Patient will demonstrate ability to ambulate 1000 feet without AD including unlevel surfaces (grass, curbs, ramps) with supervision to indicate a return to patient's prior level of function. (TARGET DATE: 01/14/2017)   Time 8   Period Weeks   Status New     PT LONG TERM GOAL #4   Title Patient will acheive a functional gait assessment score of > or = 19 to indicate a decrease in her risk of falling. (TARGET DATE: 01/14/2017)   Time 8   Period Weeks   Status New     PT LONG TERM GOAL #5   Title Patient will achieve a cognitive TUG score of < or = 15.5 seconds to indicate a decrease in her risk of falling. (TARGET DATE: 01/14/2017)   Time 8   Period Weeks   Status New               Plan - 11/24/16 1034    Clinical Impression Statement Reviewed and performed HEP initiated 11/18/16; pt required min cues and intermittant UE support to performed. Worked on Aeronautical engineer and balance with dynamic gait; pt performed at supervision level with cues for posture and body mechanics and to maintian/increase speed,                                                       Rehab Potential Good   Clinical Impairments Affecting Rehab Potential chest pain with exertion, type 2 DM, allergic rhinitis, GERD, arrhythmia    PT Frequency 2x / week   PT Duration 8 weeks   PT Treatment/Interventions ADLs/Self Care Home Management;Electrical Stimulation;DME Instruction;Gait training;Stair training;Functional mobility training;Therapeutic activities;Therapeutic exercise;Balance training;Patient/family education;Neuromuscular re-education;Manual techniques   PT  Next Visit Plan continue to work  on balance and dynamic gait; add to HEP as needed.    Consulted and Agree with Plan of Care Patient      Patient will benefit from skilled therapeutic intervention in order to improve the following deficits and impairments:  Abnormal gait, Decreased activity tolerance, Decreased balance, Decreased knowledge of precautions, Decreased endurance, Decreased knowledge of use of DME, Decreased mobility, Decreased range of motion, Decreased safety awareness, Difficulty walking, Decreased strength, Impaired sensation, Impaired tone, Postural dysfunction, Pain  Visit Diagnosis: Other abnormalities of gait and mobility  Unsteadiness on feet  History of falling     Problem List Patient Active Problem List   Diagnosis Date Noted  . Left leg weakness   . Cerebrovascular accident (CVA) ([place])   . Hyperlipidemia   . OSA (obstructive sleep apnea)   . Left-sided weakness 10/11/2016  . Orthostatic hypotension 09/18/2015  . Chest pain 08/15/2015  . Acute sinusitis 08/19/2014  . Yeast infection 08/19/2014  . Vitamin D deficiency 05/20/2014  . Gastro-esophageal reflux 03/20/2014  . Allergic rhinitis 07/09/2013  . Sleep disorder 07/09/2013  . Elevated blood pressure (not hypertension) 07/09/2013  . Mood disorder ([place]) 11/23/2011  . Diabetes mellitus ([place]) 11/23/2011  . Health care maintenance 11/23/2011    Bjorn Loser, PTA  11/24/16, 1:53 PM [facility] [address] [place], [place], [address] Phone: [phone]   Fax:  [phone]  Name: Wendy Mcclain MRN: [id number] Date of Birth: 1951/07/26

## 2016-11-24 NOTE — Therapy (Signed)
[facility] [address] [place], [place], [address] Phone: [phone]   Fax:  [phone]  Occupational Therapy Treatment  Patient Details  Name: Wendy Mcclain MRN: [id number] Date of Birth: 1951-07-10 Referring Provider: Dr Ronnie Doss, DO  Encounter Date: 11/24/2016      OT End of Session - 11/24/16 1017    Visit Number 2   Number of Visits 17   Date for OT Re-Evaluation 01/18/17   Authorization Type Medicare -    Authorization Time Period G code and progress note every 10th visit.   Authorization - Visit Number 2   Authorization - Number of Visits 10   OT Start Time [phone]   OT Stop Time 1015   OT Time Calculation (min) 41 min   Activity Tolerance Patient tolerated treatment well   Behavior During Therapy WFL for tasks assessed/performed      Past Medical History:  Diagnosis Date  . Arrhythmia   . Asthma   . Diabetes mellitus   . GERD (gastroesophageal reflux disease)     Past Surgical History:  Procedure Laterality Date  . ABDOMINAL HYSTERECTOMY  1993  . APPENDECTOMY  1963  . BREAST ENHANCEMENT SURGERY  1986    There were no vitals filed for this visit.      Subjective Assessment - 11/24/16 1014    Subjective  Pt reports ocasionally bumping items on left side   Pertinent History Chest pain with excertion; type 2 DM (insulin dependent); Allergic rhinitis; GERD; Arrhythmia. 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 No/denies          Treatment: Activities to further assess visual perceptual and cognitive skills. Per gross assessment with confrontation testing no peripheral visual field deficit noted Pt describes a dark spot in left visual field with intermittent  blurriness, pt tracks to all 4 quadrants, mildly decreased alignment 1.5 M number cancellation 100% accuracy. Trail making A-100% correct, Trailmaking B- 1 error in sequence Environmental scanning, 2/14 missed first pass, 1 items missed second pass requiring v.c., both items on left side. Therapist recommended pt does not drive at this time for safety. Copying  small peg design with left hand for fine motor coordination and visual perceptual skills, pt completed correctly without v.c.                        OT Short Term Goals - 11/18/16 1225      OT SHORT TERM GOAL #1   Title Pt will be Mod I HEP for coordaination LUE (Target date: 12/16/16)   Time 4   Period Weeks   Status New     OT SHORT TERM GOAL #2   Title Pt will be Mod I strengthening HEP for LUE (Target date 12/16/16)   Time 4   Period Weeks   Status New     OT SHORT TERM GOAL #3   Title Pt will be supervision simple functional reach mid level using L UE as assist to dominant RUE w/o LOB during simulated ADL's   Time 4   Period Weeks   Status New           OT Long Term Goals - 11/18/16 1229      OT LONG TERM  GOAL #1   Title Pt wil ldemonstrate improved coordination as seen by improved 9 hole peg test score left of 28 seconds or less & Mod I fastening buttons/fasteners when dressing.   Baseline 11/18/16: L = 34.81 seconds, R = 24.28 seconds; Min A fastening buttons, tying shoes etc.   Time 8   Period Weeks   Status New     OT LONG TERM GOAL #2   Title Pt will demonstrate improved LUE grip strength as seen by JAMAR grip assessment L of 15# or greater   Baseline 11/18/16 L = 4# vs R = 29# via JAMAR grip position II   Time 8   Period Weeks   Status New     OT LONG TERM GOAL #3   Title Pt will be Mod I simple snack prep in ADL kitchen w/o LOB   Time 8   Period Weeks   Status New     OT LONG TERM GOAL #4   Title Pt will be supervision simple bill pay, check writing   Time 8   Period Weeks    Status New     OT LONG TERM GOAL #5   Title Pt will be supervision memory strategies and cognitive compensatory strategies during ADL tasks   Time 8   Period Weeks   Status New     Long Term Additional Goals   Additional Long Term Goals Yes     OT LONG TERM GOAL #6   Title Pt will be Mod I compensatory strategies due to visual changes during ADL/homemaking activities in ADL kitchen   Time 8   Period Weeks   Status New               Plan - 11/24/16 1016    Clinical Impression Statement Pt is progressing towards goals. She demonstrates deficits in alternating attention and visual perceptual skills.   OT Frequency 2x / week   OT Duration 8 weeks   OT Treatment/Interventions Self-care/ADL training;Therapeutic exercise;Cognitive remediation/compensation;Neuromuscular education;Visual/perceptual remediation/compensation;Energy conservation;Therapist, nutritional;Therapeutic exercises;Patient/family education;DME and/or AE instruction;Therapeutic activities   Plan coordination HEP   Consulted and Agree with Plan of Care Patient      Patient will benefit from skilled therapeutic intervention in order to improve the following deficits and impairments:  Decreased cognition, Decreased knowledge of use of DME, Pain, Decreased coordination, Decreased mobility, Impaired sensation, Decreased strength, Decreased endurance, Decreased activity tolerance, Decreased range of motion, Decreased balance, Decreased knowledge of precautions, Decreased safety awareness, Impaired perceived functional ability  Visit Diagnosis: Other lack of coordination  Visuospatial deficit  Cognitive communication deficit    Problem List Patient Active Problem List   Diagnosis Date Noted  . Left leg weakness   . Cerebrovascular accident (CVA) ([place])   . Hyperlipidemia   . OSA (obstructive sleep apnea)   . Left-sided weakness 10/11/2016  . Orthostatic hypotension 09/18/2015  . Chest pain 08/15/2015   . Acute sinusitis 08/19/2014  . Yeast infection 08/19/2014  . Vitamin D deficiency 05/20/2014  . Gastro-esophageal reflux 03/20/2014  . Allergic rhinitis 07/09/2013  . Sleep disorder 07/09/2013  . Elevated blood pressure (not hypertension) 07/09/2013  . Mood disorder ([place]) 11/23/2011  . Diabetes mellitus ([place]) 11/23/2011  . Health care maintenance 11/23/2011    Melvina Pangelinan 11/24/2016, 10:19 AM Theone Murdoch, OTR/L [phone] Phone: [phone] 10:43 AM 11/24/16 [facility] [address] [place], [place], [address] Phone: [phone]   Fax:  [phone]  Name: Aldena Worm  Jahn MRN: [id number] Date of Birth: 10-Feb-1951

## 2016-11-24 NOTE — Therapy (Signed)
[facility] [address] [place], [place], [address] Phone: [phone]   Fax:  [phone]  Speech Language Pathology Treatment  Patient Details  Name: Wendy Mcclain MRN: [id number] Date of Birth: [birth year] Referring Provider: Janora Norlander, DO  Encounter Date: 11/24/2016      End of Session - 11/24/16 1649    Visit Number 2   Number of Visits 17   Date for SLP Re-Evaluation 01/14/17   SLP Start Time 1104   SLP Stop Time  1145   SLP Time Calculation (min) 41 min   Activity Tolerance Patient tolerated treatment well      Past Medical History:  Diagnosis Date  . Arrhythmia   . Asthma   . Diabetes mellitus   . GERD (gastroesophageal reflux disease)     Past Surgical History:  Procedure Laterality Date  . ABDOMINAL HYSTERECTOMY  1993  . APPENDECTOMY  1963  . BREAST ENHANCEMENT SURGERY  1986    There were no vitals filed for this visit.      Subjective Assessment - 11/24/16 1111    Subjective "I'm having trouble with finding words, and paying attention."   Currently in Pain? Yes   Pain Score 5    Pain Location --  left UE, LE   Pain Orientation Left   Pain Descriptors / Indicators Aching;Shooting   Pain Type Chronic pain   Pain Onset More than a month ago   Pain Frequency Intermittent   Aggravating Factors  nothing   Pain Relieving Factors massage of tender muscles               ADULT SLP TREATMENT - 11/24/16 1116      General Information   Behavior/Cognition Alert;Pleasant mood;Cooperative     Treatment Provided   Treatment provided Cognitive-Linquistic     Cognitive-Linquistic Treatment   Treatment focused on Cognition   Skilled Treatment SLP targeted alternating attention with pt performing a reading and written task and having to track between written words and what she is writing, along with conversation with SLP. Pt stated she was not compliant with all HEPs in OT and PT  - pt stated she hadn't done a great job of organizing her handouts - SLP reasoned with pt how she oculd improve this and pt stated to keep them all in a binder or a folder. SLP encouraged pt to do so.     Assessment / Recommendations / Plan   Plan Continue with current plan of care     Progression Toward Goals   Progression toward goals Progressing toward goals          SLP Education - 11/24/16 1648    Education provided Yes   Education Details pt may need binder/notebook/folder for organizing therapy papers and HEPs   Person(s) Educated Patient   Methods Explanation   Comprehension Verbalized understanding          SLP Short Term Goals - 11/24/16 1652      SLP SHORT TERM GOAL #1   Title Pt will attend to details in mildly complex cognitive linguistic tasks with 85% accuracy and occasional min A   Time 4   Period Weeks   Status On-going     SLP SHORT TERM GOAL #2   Title Pt will complete various naming tasks of increasing complexity with 90% success over 2 sessions, given mod assist   Time 4   Period Weeks   Status On-going  SLP SHORT TERM GOAL #3   Title Pt will implement and use a system for functional recall of daily and scheduled activities with mod assistance over 2 sessions   Time 4   Period Weeks   Status On-going          SLP Long Term Goals - 11/24/16 1652      SLP LONG TERM GOAL #1   Title Pt will alternate attention between 2 mildly complex cognitive linguistic tasks with 85% on each and occasional min A   Time 8   Period Weeks   Status On-going     SLP LONG TERM GOAL #2   Title Pt will complete Level III (abstract) naming tasks with 90% accuracy without cues   Time 8   Period Weeks   Status On-going     SLP LONG TERM GOAL #3   Title Pt will report daily functional and effective use of compensatory strategies for recall of daily activities and appointments.    Time 8   Period Weeks   Status On-going     SLP LONG TERM GOAL #4   Title pt  will tell SLP 4 memory strategies over two sessions with modified independence   Time 8   Period Weeks   Status On-going          Plan - 11/24/16 1649    Clinical Impression Statement Deficits in attention, memory and rarely in wordfinding were seen today in therapy. SLP req'd to provide cues rarely in a mod complex organizational task. Error awareness was suboptimal. Skilled ST needs to cont to address attention, memory and wordfinding deficits.   Speech Therapy Frequency 2x / week   Duration Other (comment)  8 weeks or 16 visits   Treatment/Interventions Language facilitation;Environmental controls;Cueing hierarchy;SLP instruction and feedback;Compensatory techniques;Cognitive reorganization;Functional tasks;Multimodal communcation approach;Patient/family education;Internal/external aids   Potential to Achieve Goals Good   SLP Home Exercise Plan review attention/memory strategies worksheet, carry a notebook   Consulted and Agree with Plan of Care Patient      Patient will benefit from skilled therapeutic intervention in order to improve the following deficits and impairments:   Cognitive communication deficit    Problem List Patient Active Problem List   Diagnosis Date Noted  . Left leg weakness   . Cerebrovascular accident (CVA) ([place])   . Hyperlipidemia   . OSA (obstructive sleep apnea)   . Left-sided weakness 10/11/2016  . Orthostatic hypotension 09/18/2015  . Chest pain 08/15/2015  . Acute sinusitis 08/19/2014  . Yeast infection 08/19/2014  . Vitamin D deficiency 05/20/2014  . Gastro-esophageal reflux 03/20/2014  . Allergic rhinitis 07/09/2013  . Sleep disorder 07/09/2013  . Elevated blood pressure (not hypertension) 07/09/2013  . Mood disorder ([place]) 11/23/2011  . Diabetes mellitus ([place]) 11/23/2011  . Health care maintenance 11/23/2011    [facility] ,MS, CCC-SLP  11/24/2016, 4:53 PM  [facility] [address] [place], [place], [address] Phone: [phone]   Fax:  [phone]   Name: Wendy Mcclain MRN: [id number] Date of Birth: Apr 03, 1951

## 2016-11-25 ENCOUNTER — Ambulatory Visit: Payer: Medicare Other | Admitting: Physical Therapy

## 2016-11-25 ENCOUNTER — Ambulatory Visit: Payer: Medicare Other | Admitting: Occupational Therapy

## 2016-11-25 ENCOUNTER — Encounter: Payer: Self-pay | Admitting: Physical Therapy

## 2016-11-25 ENCOUNTER — Ambulatory Visit: Payer: Medicare Other | Admitting: Speech Pathology

## 2016-11-25 DIAGNOSIS — R278 Other lack of coordination: Secondary | ICD-10-CM | POA: Diagnosis not present

## 2016-11-25 DIAGNOSIS — R261 Paralytic gait: Secondary | ICD-10-CM | POA: Diagnosis not present

## 2016-11-25 DIAGNOSIS — R41842 Visuospatial deficit: Secondary | ICD-10-CM

## 2016-11-25 DIAGNOSIS — R2689 Other abnormalities of gait and mobility: Secondary | ICD-10-CM

## 2016-11-25 DIAGNOSIS — M6281 Muscle weakness (generalized): Secondary | ICD-10-CM

## 2016-11-25 DIAGNOSIS — R41841 Cognitive communication deficit: Secondary | ICD-10-CM

## 2016-11-25 DIAGNOSIS — Z9181 History of falling: Secondary | ICD-10-CM | POA: Diagnosis not present

## 2016-11-25 DIAGNOSIS — R2681 Unsteadiness on feet: Secondary | ICD-10-CM | POA: Diagnosis not present

## 2016-11-25 DIAGNOSIS — I69354 Hemiplegia and hemiparesis following cerebral infarction affecting left non-dominant side: Secondary | ICD-10-CM | POA: Diagnosis not present

## 2016-11-25 NOTE — Therapy (Signed)
[facility] [address] [place], [place], [address] Phone: [phone]   Fax:  [phone]  Occupational Therapy Treatment  Patient Details  Name: Wendy Mcclain MRN: [id number] Date of Birth: 1950-08-04 Referring Provider: Dr Ronnie Doss, DO  Encounter Date: 11/25/2016      OT End of Session - 11/25/16 0953    Visit Number 3   Number of Visits 17   Date for OT Re-Evaluation 01/18/17   Authorization Type Medicare -    Authorization Time Period G code and progress note every 10th visit.   Authorization - Visit Number 3   Authorization - Number of Visits 10   OT Start Time 4008   OT Stop Time 1015   OT Time Calculation (min) 40 min   Activity Tolerance Patient tolerated treatment well   Behavior During Therapy WFL for tasks assessed/performed      Past Medical History:  Diagnosis Date  . Arrhythmia   . Asthma   . Diabetes mellitus   . GERD (gastroesophageal reflux disease)     Past Surgical History:  Procedure Laterality Date  . ABDOMINAL HYSTERECTOMY  1993  . APPENDECTOMY  1963  . BREAST ENHANCEMENT SURGERY  1986    There were no vitals filed for this visit.      Subjective Assessment - 11/25/16 0937    Subjective  Pt reports having a sleep study last night   Pertinent History Chest pain with excertion; type 2 DM (insulin dependent); Allergic rhinitis; GERD; Arrhythmia. 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 No/denies              Large print word search for tabletop scanning, min difficulty. Completing 24 piece puzzle, pt required min v.c for orientation of puzzle pieces (Pt had bottom portion of positioned initally as a side  portion).                  OT Education - 11/25/16 1012    Education provided Yes   Education Details coordination HEP, putty HEP   Person(s) Educated Patient   Methods Explanation;Demonstration;Verbal cues;Handout   Comprehension Returned demonstration;Verbal cues required;Verbalized understanding          OT Short Term Goals - 11/18/16 1225      OT SHORT TERM GOAL #1   Title Pt will be Mod I HEP for coordaination LUE (Target date: 12/16/16)   Time 4   Period Weeks   Status New     OT SHORT TERM GOAL #2   Title Pt will be Mod I strengthening HEP for LUE (Target date 12/16/16)   Time 4   Period Weeks   Status New     OT SHORT TERM GOAL #3   Title Pt will be supervision simple functional reach mid level using L UE as assist to dominant RUE w/o LOB during simulated ADL's   Time 4   Period Weeks   Status New           OT Long Term Goals - 11/18/16 1229      OT LONG TERM GOAL #1   Title Pt wil ldemonstrate improved coordination as seen by improved 9 hole peg test score left of 28 seconds or less & Mod I fastening buttons/fasteners when dressing.  Baseline 11/18/16: L = 34.81 seconds, R = 24.28 seconds; Min A fastening buttons, tying shoes etc.   Time 8   Period Weeks   Status New     OT LONG TERM GOAL #2   Title Pt will demonstrate improved LUE grip strength as seen by JAMAR grip assessment L of 15# or greater   Baseline 11/18/16 L = 4# vs R = 29# via JAMAR grip position II   Time 8   Period Weeks   Status New     OT LONG TERM GOAL #3   Title Pt will be Mod I simple snack prep in ADL kitchen w/o LOB   Time 8   Period Weeks   Status New     OT LONG TERM GOAL #4   Title Pt will be supervision simple bill pay, check writing   Time 8   Period Weeks   Status New     OT LONG TERM GOAL #5   Title Pt will be supervision memory strategies and cognitive compensatory strategies during ADL tasks   Time 8   Period Weeks   Status New     Long Term  Additional Goals   Additional Long Term Goals Yes     OT LONG TERM GOAL #6   Title Pt will be Mod I compensatory strategies due to visual changes during ADL/homemaking activities in ADL kitchen   Time 8   Period Weeks   Status New               Plan - 11/25/16 1010    Clinical Impression Statement Pt is progressing towards goals. She demonstrates understanding of coordination and putty HEP.   Rehab Potential Fair   OT Frequency 2x / week   OT Duration 8 weeks   OT Treatment/Interventions Self-care/ADL training;Therapeutic exercise;Cognitive remediation/compensation;Neuromuscular education;Visual/perceptual remediation/compensation;Energy conservation;Therapist, nutritional;Therapeutic exercises;Patient/family education;DME and/or AE instruction;Therapeutic activities   Plan continue to address, coordination, visual perceptual skills   Consulted and Agree with Plan of Care Patient      Patient will benefit from skilled therapeutic intervention in order to improve the following deficits and impairments:  Decreased cognition, Decreased knowledge of use of DME, Pain, Decreased coordination, Decreased mobility, Impaired sensation, Decreased strength, Decreased endurance, Decreased activity tolerance, Decreased range of motion, Decreased balance, Decreased knowledge of precautions, Decreased safety awareness, Impaired perceived functional ability  Visit Diagnosis: Other lack of coordination  Visuospatial deficit  Muscle weakness (generalized)    Problem List Patient Active Problem List   Diagnosis Date Noted  . Left leg weakness   . Cerebrovascular accident (CVA) ([place])   . Hyperlipidemia   . OSA (obstructive sleep apnea)   . Left-sided weakness 10/11/2016  . Orthostatic hypotension 09/18/2015  . Chest pain 08/15/2015  . Acute sinusitis 08/19/2014  . Yeast infection 08/19/2014  . Vitamin D deficiency 05/20/2014  . Gastro-esophageal reflux 03/20/2014  . Allergic  rhinitis 07/09/2013  . Sleep disorder 07/09/2013  . Elevated blood pressure (not hypertension) 07/09/2013  . Mood disorder ([place]) 11/23/2011  . Diabetes mellitus ([place]) 11/23/2011  . Health care maintenance 11/23/2011    Benito Lemmerman 11/25/2016, 12:34 PM  [facility] [address] [place], [place], [address] Phone: [phone]   Fax:  [phone]  Name: Wendy Mcclain MRN: [id number] Date of Birth: [birth year]

## 2016-11-25 NOTE — Therapy (Signed)
[facility] [address] [place], [place], [address] Phone: [phone]   Fax:  [phone]  Physical Therapy Treatment  Patient Details  Name: Wendy Mcclain MRN: [id number] Date of Birth: 1951-04-07 Referring Provider: Ronnie Doss DO  Talbert Cage MD)  Encounter Date: 11/25/2016     PT End of Session - 11/25/16 22-Nov-1200    Visit Number 4   Number of Visits 17   Date for PT Re-Evaluation 01/14/17   Authorization Type Medicare & G-codes    PT Start Time 11/23/02   PT Stop Time 1142   PT Time Calculation (min) 38 min   Equipment Utilized During Treatment Gait belt   Behavior During Therapy WFL for tasks assessed/performed      Past Medical History:  Diagnosis Date  . Arrhythmia   . Asthma   . Diabetes mellitus   . GERD (gastroesophageal reflux disease)     Past Surgical History:  Procedure Laterality Date  . ABDOMINAL HYSTERECTOMY  1993  . APPENDECTOMY  1963  . BREAST ENHANCEMENT SURGERY  1986    There were no vitals filed for this visit.      Subjective Assessment - 11/25/16 1105    Subjective Pt had a sleep study this morning and will not know the results until next week possibly.   Currently in Pain? Yes                         [facility] Adult PT Treatment/Exercise - 11/25/16 0001      Transfers   Transfers Sit to Stand;Stand to Sit   Sit to Stand 5: Supervision   Five time sit to stand comments  5x2 cues for body mechanics   Stand to Sit Without upper extremity assist;5: Supervision     Ambulation/Gait   Ambulation/Gait Yes   Ambulation/Gait Assistance 5: Supervision   Ambulation/Gait Assistance Details training for heel- toe gait pattern and reciprocal UE swing; cues for technique.   Ambulation Distance (Feet) 400 Feet   Assistive device None   Gait Pattern Step-through pattern;Decreased stride length;Decreased step length - right;Decreased step length - left;Trunk  flexed;Narrow base of support   Ambulation Surface Level;Indoor             Balance Exercises - 11/25/16 1132      Balance Exercises: Standing   SLS with Vectors Solid surface  multi directional tapping cones with alt foot   Stepping Strategy Anterior;Posterior;Lateral  No UE support, min guard, cues for body mechanics to weight    Gait with Head Turns Forward  115, cues to visually target items in room.             PT Short Term Goals - 11/16/16 1330      PT SHORT TERM GOAL #1   Title Patient will verbalize understanding of initial HEP to decrease her risk of falling. (TARGET DATE: 12/17/2016)   Time 4   Period Weeks   Status New     PT SHORT TERM GOAL #2   Title Patient will achieve a cognitive TUG score of < or = 18 seconds in order to decrease her risk of falling. (TARGET DATE: 12/17/2016)   Time 4   Period Weeks   Status New     PT SHORT TERM GOAL #3   Title Patient will demonstrate an ability to ambulate 500 feet with supervision and LRAD to indicate a decrease in her risk of falling. (TARGET DATE: 12/17/2016)  Time 4   Period Weeks   Status New           PT Long Term Goals - 11/16/16 1334      PT LONG TERM GOAL #1   Title Patient will verbalize understanidng of HEP and ongoing fitness program. (TARGET DATE: 01/14/2017)    Time 8   Period Weeks   Status New     PT LONG TERM GOAL #2   Title Patient will achieve a score of >or = 45 to demonstrate a decrease in her risk of falling. (TARGET DATE: 01/14/2017)   Time 8   Period Weeks   Status New     PT LONG TERM GOAL #3   Title Patient will demonstrate ability to ambulate 1000 feet without AD including unlevel surfaces (grass, curbs, ramps) with supervision to indicate a return to patient's prior level of function. (TARGET DATE: 01/14/2017)   Time 8   Period Weeks   Status New     PT LONG TERM GOAL #4   Title Patient will acheive a functional gait assessment score of > or = 19 to indicate a  decrease in her risk of falling. (TARGET DATE: 01/14/2017)   Time 8   Period Weeks   Status New     PT LONG TERM GOAL #5   Title Patient will achieve a cognitive TUG score of < or = 15.5 seconds to indicate a decrease in her risk of falling. (TARGET DATE: 01/14/2017)   Time 8   Period Weeks   Status New               Plan - 11/25/16 1133    Clinical Impression Statement Worked on gait mechanics during ambulation; pt demonstrates progress with heel toe pattern.  Training for stepping strategies and SLS tapping of cones on solid surface; pt performing with no UE support with min guard.   Rehab Potential Good   Clinical Impairments Affecting Rehab Potential chest pain with exertion, type 2 DM, allergic rhinitis, GERD, arrhythmia    PT Frequency 2x / week   PT Duration 8 weeks   PT Treatment/Interventions ADLs/Self Care Home Management;Electrical Stimulation;DME Instruction;Gait training;Stair training;Functional mobility training;Therapeutic activities;Therapeutic exercise;Balance training;Patient/family education;Neuromuscular re-education;Manual techniques   PT Next Visit Plan continue to work on balance (stepping strategies and compliant surfacae) and dynamic gait; LE strengthening   Consulted and Agree with Plan of Care Patient      Patient will benefit from skilled therapeutic intervention in order to improve the following deficits and impairments:  Abnormal gait, Decreased activity tolerance, Decreased balance, Decreased knowledge of precautions, Decreased endurance, Decreased knowledge of use of DME, Decreased mobility, Decreased range of motion, Decreased safety awareness, Difficulty walking, Decreased strength, Impaired sensation, Impaired tone, Postural dysfunction, Pain  Visit Diagnosis: Other abnormalities of gait and mobility  Unsteadiness on feet  History of falling     Problem List Patient Active Problem List   Diagnosis Date Noted  . Left leg weakness   .  Cerebrovascular accident (CVA) ([place])   . Hyperlipidemia   . OSA (obstructive sleep apnea)   . Left-sided weakness 10/11/2016  . Orthostatic hypotension 09/18/2015  . Chest pain 08/15/2015  . Acute sinusitis 08/19/2014  . Yeast infection 08/19/2014  . Vitamin D deficiency 05/20/2014  . Gastro-esophageal reflux 03/20/2014  . Allergic rhinitis 07/09/2013  . Sleep disorder 07/09/2013  . Elevated blood pressure (not hypertension) 07/09/2013  . Mood disorder ([place]) 11/23/2011  . Diabetes mellitus ([place]) 11/23/2011  . Health care  maintenance 11/23/2011    Bjorn Loser, PTA  11/25/16, 12:05 PM [facility] [address] [place], [place], [address] Phone: [phone]   Fax:  [phone]  Name: Wendy Mcclain MRN: [id number] Date of Birth: 1951/04/17

## 2016-11-25 NOTE — Therapy (Signed)
[facility] [address] [place], [place], [address] Phone: [phone]   Fax:  [phone]  Speech Language Pathology Treatment  Patient Details  Name: Wendy Mcclain MRN: [id number] Date of Birth: [birth year] Referring Provider: Janora Norlander, DO  Encounter Date: 11/25/2016      End of Session - 11/25/16 1306    Visit Number 3   Number of Visits 17   Date for SLP Re-Evaluation 01/14/17   SLP Start Time 1018   SLP Stop Time  1102   SLP Time Calculation (min) 44 min   Activity Tolerance Patient tolerated treatment well      Past Medical History:  Diagnosis Date  . Arrhythmia   . Asthma   . Diabetes mellitus   . GERD (gastroesophageal reflux disease)     Past Surgical History:  Procedure Laterality Date  . ABDOMINAL HYSTERECTOMY  1993  . APPENDECTOMY  1963  . BREAST ENHANCEMENT SURGERY  1986    There were no vitals filed for this visit.      Subjective Assessment - 11/25/16 1021    Subjective "Wendy Mcclain gave me some homework... I think I did it right."               ADULT SLP TREATMENT - 11/25/16 1021      General Information   Behavior/Cognition Alert;Pleasant mood;Cooperative   Patient Positioning Upright in chair   Oral care provided N/A     Treatment Provided   Treatment provided Cognitive-Linquistic     Cognitive-Linquistic Treatment   Treatment focused on Cognition   Skilled Treatment Pt returned with partially completed home tasks; did bring her therapy assignments in a folder. Stated she has a calendar book at home but has not begun using it. SLP targeted alternating attention while reviewing patient's home assignment deduction puzzle; pt required mod cues to track between reading/writing. SLP provided calendar worksheets, pt labeled dates and added upcoming therapy appointments with min cues for specificity. With min cues, pt identified available times to perform home exercises for  OT, ST and scheduled time for these activities. Instructed pt to begin using similar format, transfer information to her own planner and bring to next session.      Assessment / Recommendations / Plan   Plan Continue with current plan of care     Progression Toward Goals   Progression toward goals Progressing toward goals          SLP Education - 11/25/16 1305    Education provided Yes   Education Details calendar use to schedule time to complete HEP for all disciplines   Person(s) Educated Patient   Methods Explanation;Demonstration;Verbal cues;Handout   Comprehension Verbalized understanding;Returned demonstration;Need further instruction;Verbal cues required          SLP Short Term Goals - 11/25/16 1307      SLP SHORT TERM GOAL #1   Title Pt will attend to details in mildly complex cognitive linguistic tasks with 85% accuracy and occasional min A   Time 4   Period Weeks   Status On-going     SLP SHORT TERM GOAL #2   Title Pt will complete various naming tasks of increasing complexity with 90% success over 2 sessions, given mod assist   Time 4   Period Weeks   Status On-going     SLP SHORT TERM GOAL #3   Title Pt will implement and use a system for functional recall of daily and scheduled activities with mod assistance  over 2 sessions   Time 4   Period Weeks          SLP Long Term Goals - 11/25/16 1308      SLP LONG TERM GOAL #1   Title Pt will alternate attention between 2 mildly complex cognitive linguistic tasks with 85% on each and occasional min A   Time 8   Period Weeks   Status On-going     SLP LONG TERM GOAL #2   Title Pt will complete Level III (abstract) naming tasks with 90% accuracy without cues   Time 8   Period Weeks   Status On-going     SLP LONG TERM GOAL #3   Title Pt will report daily functional and effective use of compensatory strategies for recall of daily activities and appointments.    Time 8   Period Weeks   Status On-going      SLP LONG TERM GOAL #4   Title pt will tell SLP 4 memory strategies over two sessions with modified independence   Time 8   Period Weeks   Status On-going          Plan - 11/25/16 1307    Clinical Impression Statement Deficits in attention, memory and rarely in wordfinding were seen today in therapy. SLP req'd to provide cues rarely in a mod complex organizational task. Error awareness was suboptimal. Skilled ST needs to cont to address attention, memory and wordfinding deficits.   Speech Therapy Frequency 2x / week   Duration Other (comment)   Treatment/Interventions Language facilitation;Environmental controls;Cueing hierarchy;SLP instruction and feedback;Compensatory techniques;Cognitive reorganization;Functional tasks;Multimodal communcation approach;Patient/family education;Internal/external aids   Potential to Achieve Goals Good   SLP Home Exercise Plan review attention/memory strategies worksheet, carry a notebook   Consulted and Agree with Plan of Care Patient      Patient will benefit from skilled therapeutic intervention in order to improve the following deficits and impairments:   Cognitive communication deficit    Problem List Patient Active Problem List   Diagnosis Date Noted  . Left leg weakness   . Cerebrovascular accident (CVA) ([place])   . Hyperlipidemia   . OSA (obstructive sleep apnea)   . Left-sided weakness 10/11/2016  . Orthostatic hypotension 09/18/2015  . Chest pain 08/15/2015  . Acute sinusitis 08/19/2014  . Yeast infection 08/19/2014  . Vitamin D deficiency 05/20/2014  . Gastro-esophageal reflux 03/20/2014  . Allergic rhinitis 07/09/2013  . Sleep disorder 07/09/2013  . Elevated blood pressure (not hypertension) 07/09/2013  . Mood disorder ([place]) 11/23/2011  . Diabetes mellitus ([place]) 11/23/2011  . Health care maintenance 11/23/2011   Deneise Lever, MS CF-SLP Speech-Language Pathologist  Aliene Altes 11/25/2016, 1:09 PM  [facility] [address] [place], [place], [address] Phone: [phone]   Fax:  [phone]   Name: Wendy Mcclain MRN: [id number] Date of Birth: [birth year]

## 2016-11-25 NOTE — Patient Instructions (Addendum)
  Coordination Activities  Perform the following activities for 20 minutes 1 times per day with left hand(s).   Rotate ball in fingertips (clockwise and counter-clockwise).  Toss ball between hands.  Toss ball in air and catch with the same hand.  Flip cards 1 at a time as fast as you can.  Deal cards with your thumb (Hold deck in hand and push card off top with thumb).  Pick up coins, buttons, marbles, dried beans/pasta of different sizes and place in container.  Pick up coins and place in container or coin bank.  Pick up coins and stack.  Pick up coins one at a time until you get 5-10 in your hand, then move coins from palm to fingertips to stack one at a time.  Practice writing and/or typing.         1. Grip Strengthening (Resistive Putty)   Squeeze putty using thumb and all fingers. Repeat _20___ times. Do __2__ sessions per day.   2. Roll putty into tube on table and pinch between each finger and thumb x 10 reps each. (can do ring and small finger together)     Copyright  VHI. All rights reserved.

## 2016-11-26 NOTE — Procedures (Signed)
PATIENT'S NAME:  Wendy Mcclain, Wendy Mcclain DOB:      June 21, 1951      MR#:    [id number]     DATE OF RECORDING: 11/24/2016 REFERRING M.D.: Rosalin Hawking, MD, PCP: Ronnie Doss DO Study Performed:   Baseline Polysomnogram HISTORY: 66 year old woman with a history of allergic rhinitis, reflux disease, type 2 diabetes, overweight state, hyperlipidemia, right caudate lacunar infarct on 10/11/2016, who reports snoring and excessive daytdaytime somnolence. The patient endorsed the Epworth Sleepiness Scale at 6 points. The patient's weight 141 pounds with a height of 63 (inches), resulting in a BMI of 25 kg/m2. The patient's neck circumference measured 13 inches.  CURRENT MEDICATIONS: Proventil, Aspirin, Lipitor, Zyrtec, Mucinex, Humalog, Insulin, Prilosec, Senokot-S   PROCEDURE:  This is a multichannel digital polysomnogram utilizing the Somnostar 11.2 system.  Electrodes and sensors were applied and monitored per AASM Specifications.   EEG, EOG, Chin and Limb EMG, were sampled at 200 Hz.  ECG, Snore and Nasal Pressure, Thermal Airflow, Respiratory Effort, CPAP Flow and Pressure, Oximetry was sampled at 50 Hz. Digital video and audio were recorded.      BASELINE STUDY  Lights Out was at 22:43 and Lights On at 05:11.  Total recording time (TRT) was 388.5 minutes, with a total sleep time (TST) of  332.5 minutes.   The patient's sleep latency was delayed. REM latency was 85.5 minutes, which is normal.  The sleep efficiency was 85.6 %.     SLEEP ARCHITECTURE: WASO (Wake after sleep onset) was 43 minutes with moderate sleep fragmentation noted. There were 24 minutes in Stage N1, 133.5 minutes Stage N2, 116 minutes Stage N3 and 59 minutes in Stage REM.  The percentage of Stage N1 was 7.2%, Stage N2 was 40.2%, Stage N3 was 34.9%, which is increased, and Stage R (REM sleep) was 17.7%, which is mildly reduced. The arousals were noted as: 32 were spontaneous, 0 were associated with PLMs, 16 were associated with respiratory  events.    Audio and video analysis did not show any abnormal or unusual movements, behaviors, phonations or vocalizations.  The patient took no bathroom breaks. No significant snoring was noted. The EKG was in keeping with normal sinus rhythm (NSR).  RESPIRATORY ANALYSIS:  There were a total of 9 respiratory events:  0 obstructive apneas, 0 central apneas and 0 mixed apneas with a total of 0 apneas and an apnea index (AI) of 0 /hour. There were 9 hypopneas with a hypopnea index of 1.6 /hour. The patient also had 10 respiratory event related arousals (RERAs).      The total APNEA/HYPOPNEA INDEX (AHI) was 1.6/hour and the total RESPIRATORY DISTURBANCE INDEX was 3.4 /hour.  0 events occurred in REM sleep and 18 events in NREM. The REM AHI was 0 /hour, versus a non-REM AHI of 2.. The patient spent 69.5 minutes of total sleep time in the supine position and 263 minutes in non-supine.. The supine AHI was 0.9 versus a non-supine AHI of 1.8.  OXYGEN SATURATION & C02:  The Wake baseline 02 saturation was 98%, with the lowest being 88%. Time spent below 89% saturation equaled 1 minutes.  PERIODIC LIMB MOVEMENTS: The patient had a total of 0 Periodic Limb Movements.  The Periodic Limb Movement (PLM) index was 0 and the PLM Arousal index was 0/hour.  Post-study, the patient indicated that sleep was the same as usual.   IMPRESSION:  1. Hypersomnia, unspecified 2. Dysfunctions associated with sleep stages or arousal from sleep  RECOMMENDATIONS:  1. This study  does not demonstrate any significant obstructive or central sleep disordered breathing. This study does not support an intrinsic sleep disorder as a cause of the patient's symptoms. Other causes, including circadian rhythm disturbances, an underlying mood disorder, medication effect and/or an underlying medical problem cannot be ruled out. 2. This study shows sleep fragmentation and abnormal sleep stage percentages; these are nonspecific findings and  per se do not signify an intrinsic sleep disorder or a cause for the patient's sleep-related symptoms. Causes include (but are not limited to) the first night effect of the sleep study, circadian rhythm disturbances, medication effect or an underlying mood disorder or medical problem.  3. The patient should be cautioned not to drive, work at heights, or operate dangerous or heavy equipment when tired or sleepy. Review and reiteration of good sleep hygiene measures should be pursued with any patient. 4. The patient can follow-up with her referring provider, who will be notified of the test results.   I certify that I have reviewed the entire raw data recording prior to the issuance of this report in accordance with the Standards of Accreditation of the American Academy of Sleep Medicine (AASM)    Star Age, MD, PhD Diplomat, American Board of Psychiatry and Neurology (Neurology and Sleep Medicine)

## 2016-11-26 NOTE — Progress Notes (Signed)
Patient referred by Dr. Erlinda Hong, seen by me on 10/26/16, diagnostic PSG on 11/24/16.   Please call and notify the patient that the recent sleep study did not show any significant obstructive sleep apnea. Please inform patient that she can FU with Dr. Erlinda Hong or CM as planned. Also, route or fax report to PCP and referring MD, if other than PCP.   Please remind patient to try to maintain good sleep hygiene, which means: Keep a regular sleep and wake schedule, try not to exercise or have a meal within 2 hours of your bedtime, try to keep your bedroom conducive for sleep, that is, cool and dark, without light distractors such as an illuminated alarm clock, and refrain from watching TV right before sleep or in the middle of the night and do not keep the TV or radio on during the night. Also, try not to use or play on electronic devices at bedtime, such as your cell phone, tablet PC or laptop. If you like to read at bedtime on an electronic device, try to dim the background light as much as possible. Do not eat in the middle of the night.  Once you have spoken to patient, you can close this encounter.   Thanks,  Star Age, MD, PhD [facility])

## 2016-11-29 ENCOUNTER — Telehealth: Payer: Self-pay

## 2016-11-29 NOTE — Telephone Encounter (Signed)
Pt returned your call and is asking for a call back please

## 2016-11-29 NOTE — Telephone Encounter (Signed)
Rn call patient back that her cardiac monitor was negative for any irregular heart beat related to stroke. Continue treatment plan. Pt verbalized understanding.

## 2016-11-29 NOTE — Telephone Encounter (Signed)
:  Left vm for patient to call back about cardiac monitor results. ------ 

## 2016-11-29 NOTE — Telephone Encounter (Signed)
-----   Message from Rosalin Hawking, MD sent at 11/29/2016  9:28 AM EDT ----- Could you please let the patient know that the heart monitoring test done recently was negative for the irregular heart beat related to stroke. Please continue current treatment. Thanks.  Rosalin Hawking, MD PhD Stroke Neurology 11/29/2016 9:28 AM

## 2016-11-30 ENCOUNTER — Telehealth: Payer: Self-pay

## 2016-11-30 ENCOUNTER — Encounter: Payer: Self-pay | Admitting: Nurse Practitioner

## 2016-11-30 ENCOUNTER — Ambulatory Visit (INDEPENDENT_AMBULATORY_CARE_PROVIDER_SITE_OTHER): Payer: Medicare Other | Admitting: Nurse Practitioner

## 2016-11-30 VITALS — BP 115/68 | HR 74 | Ht 63.0 in | Wt 141.2 lb

## 2016-11-30 DIAGNOSIS — I63311 Cerebral infarction due to thrombosis of right middle cerebral artery: Secondary | ICD-10-CM

## 2016-11-30 DIAGNOSIS — E785 Hyperlipidemia, unspecified: Secondary | ICD-10-CM | POA: Diagnosis not present

## 2016-11-30 DIAGNOSIS — R29898 Other symptoms and signs involving the musculoskeletal system: Secondary | ICD-10-CM | POA: Diagnosis not present

## 2016-11-30 DIAGNOSIS — R531 Weakness: Secondary | ICD-10-CM | POA: Diagnosis not present

## 2016-11-30 DIAGNOSIS — G4733 Obstructive sleep apnea (adult) (pediatric): Secondary | ICD-10-CM | POA: Diagnosis not present

## 2016-11-30 DIAGNOSIS — I639 Cerebral infarction, unspecified: Secondary | ICD-10-CM

## 2016-11-30 NOTE — Telephone Encounter (Signed)
-----   Message from Star Age, MD sent at 11/26/2016  1:54 PM EDT ----- Patient referred by Dr. Erlinda Hong, seen by me on 10/26/16, diagnostic PSG on 11/24/16.   Please call and notify the patient that the recent sleep study did not show any significant obstructive sleep apnea. Please inform patient that she can FU with Dr. Erlinda Hong or CM as planned. Also, route or fax report to PCP and referring MD, if other than PCP.   Please remind patient to try to maintain good sleep hygiene, which means: Keep a regular sleep and wake schedule, try not to exercise or have a meal within 2 hours of your bedtime, try to keep your bedroom conducive for sleep, that is, cool and dark, without light distractors such as an illuminated alarm clock, and refrain from watching TV right before sleep or in the middle of the night and do not keep the TV or radio on during the night. Also, try not to use or play on electronic devices at bedtime, such as your cell phone, tablet PC or laptop. If you like to read at bedtime on an electronic device, try to dim the background light as much as possible. Do not eat in the middle of the night.  Once you have spoken to patient, you can close this encounter.   Thanks,  Star Age, MD, PhD [facility])

## 2016-11-30 NOTE — Progress Notes (Signed)
[facility]  PATIENT: Wendy Mcclain DOB: 1950-10-02   REASON FOR VISIT: Hospital follow-up for stroke right caudate lacunar infarct secondary to small vessel disease  HISTORY FROM: Patient    HISTORY OF PRESENT ILLNESS: Wendy Mcclain, 66 year old female with uncontrolled diabetes history of asthma and elevated blood pressure presented to the hospital with left leg weakness. She did not receive IV TPA,. She also had a left-sided mild frontal headache that started with the onset of her weakness. MRI of the brain right caudate lacunar infarct secondary to small vessel disease. MRA unremarkable. Carotid Doppler unremarkable. 2-D echo 55-60%. LDL 79 hemoglobin A1c 8.8. She returns to the stroke clinic today for follow-up. She remains on aspirin 325 daily with no bruising and no bleeding. She has not had further stroke or TIA symptoms. She is on Lipitor for hyperlipidemia without complaints of myalgias. She has had some changes to her insulin dose by her endocrinologist and she is continuing to monitor that closely. She has had evaluation for obstructive sleep apnea but does not know the results. 30 day event monitor without episodes of atrial fibrillation or other irregular beats. She is continuing to get physical therapy and occupational therapy for her left-sided weakness. She returns for reevaluation.   REVIEW OF SYSTEMS: Full 14 system review of systems performed and notable only for those listed, all others are neg:  Constitutional: Fatigue  Cardiovascular: neg Ear/Nose/Throat: neg  Skin: neg Eyes: neg Respiratory: neg Gastroitestinal: neg  Hematology/Lymphatic: neg  Endocrine: neg Musculoskeletal: Walking difficulty Allergy/Immunology: neg Neurological: Intermittent headache, weakness Psychiatric: neg Sleep : Snoring   ALLERGIES: Allergies  Allergen Reactions  . Metformin And Related Diarrhea    HOME MEDICATIONS: Outpatient Medications Prior to Visit    Medication Sig Dispense Refill  . albuterol (PROVENTIL HFA;VENTOLIN HFA) 108 (90 Base) MCG/ACT inhaler Inhale 2 puffs into the lungs every 6 (six) hours as needed for wheezing or shortness of breath. 3 Inhaler 1  . aspirin EC 325 MG tablet TAKE 1 TABLET BY MOUTH DAILY 30 tablet 3  . atorvastatin (LIPITOR) 40 MG tablet TAKE 1 TABLET BY MOUTH DAILY AT 6 PM 90 tablet 4  . cetirizine (ZYRTEC) 10 MG tablet Take 1 tablet (10 mg total) by mouth daily. 90 tablet 4  . guaiFENesin (MUCINEX) 600 MG 12 hr tablet Take 1 tablet (600 mg total) by mouth 2 (two) times daily. For thinning of secretions 30 tablet 0  . HUMALOG KWIKPEN 100 UNIT/ML KiwkPen Inject 4 Units into the skin 4 (four) times daily -  before meals and at bedtime.  0  . Insulin Degludec (TRESIBA FLEXTOUCH) 100 UNIT/ML SOPN Inject 32 Units into the skin daily.    . Multiple Vitamin (MULTIVITAMIN) tablet Take 1 tablet by mouth daily.    Marland Kitchen omeprazole (PRILOSEC) 20 MG capsule Take 1 capsule (20 mg total) by mouth daily. 90 capsule 1  . senna-docusate (SENOKOT-S) 8.6-50 MG tablet TAKE 1 TABLET BY MOUTH EVERY NIGHT AT BEDTIME AS NEEDED FOR MILD CONSTIPATION 30 tablet 0   No facility-administered medications prior to visit.     PAST MEDICAL HISTORY: Past Medical History:  Diagnosis Date  . Arrhythmia   . Asthma   . Diabetes mellitus   . GERD (gastroesophageal reflux disease)     PAST SURGICAL HISTORY: Past Surgical History:  Procedure Laterality Date  . ABDOMINAL HYSTERECTOMY  1993  . APPENDECTOMY  1963  . BREAST ENHANCEMENT SURGERY  1986    FAMILY HISTORY: Family History  Problem  Relation Age of Onset  . Alzheimer's disease Mother   . Cancer Father     esophageal  . Aortic aneurysm Paternal Aunt     2 aunts died at age 10  . Cancer Sister     stomach  . Cancer Paternal Grandfather     mouth & larynx  . Cancer Paternal Aunt     colon  . Heart disease    . Diabetes    . Hyperlipidemia    . Hypertension    . Stroke    .  Thyroid disease Sister   . Hyperlipidemia Daughter   . Thyroid disease Daughter   . Colon cancer Neg Hx     SOCIAL HISTORY: Social History   Social History  . Marital status: Married    Spouse name: N/A  . Number of children: N/A  . Years of education: N/A   Occupational History  . Not on file.   Social History Main Topics  . Smoking status: Never Smoker  . Smokeless tobacco: Never Used  . Alcohol use No  . Drug use: No  . Sexual activity: Not on file   Other Topics Concern  . Not on file   Social History Narrative   Widowed   Lives in [place] to move in with daughter   2 daughters - 1 in [place]; 1 in [place]   6 grandchildren     PHYSICAL EXAM  Vitals:   11/30/16 0834  BP: 115/68  Pulse: 74  Weight: 141 lb 3.2 oz (64 kg)  Height: 5\' 3"  (1.6 m)   Body mass index is 25.01 kg/m.  Generalized: Well developed, in no acute distress  Head: normocephalic and atraumatic,. Oropharynx benign  Neck: Supple, no carotid bruits  Cardiac: Regular rate rhythm, no murmur  Musculoskeletal: No deformity   Neurological examination   Mentation: Alert oriented to time, place, history taking. Attention span and concentration appropriate. Recent and remote memory intact.  Follows all commands speech and language fluent.   Cranial nerve II-XII: Fundoscopic exam reveals sharp disc margins.Pupils were equal round reactive to light extraocular movements were full, visual field were full on confrontational test. Facial sensation and strength were normal. hearing was intact to finger rubbing bilaterally. Uvula tongue midline. head turning and shoulder shrug were normal and symmetric.Tongue protrusion into cheek strength was normal. Motor: normal bulk and tone, full strength in the BUE, BLE, on the right, mild left grip weakness, 4 out of 5 left lower extremity weakness.  Sensory: normal and symmetric to light touch, pinprick, and  Vibration,  in the upper and lower  extremities Coordination: finger-nose-finger, heel-to-shin bilaterally, no dysmetria Reflexes:  1+ upper lower and symmetric plantar responses were flexor bilaterally. Gait and Station: Rising up from seated position without assistance, normal stance,  moderate stride, good arm swing, smooth turning, able to perform tiptoe, and heel walking without difficulty. Tandem gait is mildly unsteady. No assistive device   DIAGNOSTIC DATA (LABS, IMAGING, TESTING) - I reviewed patient records, labs, notes, testing and imaging myself where available.  Lab Results  Component Value Date   WBC 5.1 10/12/2016   HGB 12.3 10/12/2016   HCT 37.4 10/12/2016   MCV 90.6 10/12/2016   PLT 233 10/12/2016      Component Value Date/Time   NA 137 10/13/2016 0350   K 3.9 10/13/2016 0350   CL 110 10/13/2016 0350   CO2 22 10/13/2016 0350   GLUCOSE 139 (H) 10/13/2016 0350   BUN 6  10/13/2016 0350   CREATININE 0.85 10/13/2016 0350   CALCIUM 8.7 (L) 10/13/2016 0350   GFRNONAA >60 10/13/2016 0350   GFRAA >60 10/13/2016 0350   Lab Results  Component Value Date   CHOL 164 10/12/2016   HDL 45 10/12/2016   LDLCALC 79 10/12/2016   TRIG 201 (H) 10/12/2016   CHOLHDL 3.6 10/12/2016   Lab Results  Component Value Date   HGBA1C 8.8 (H) 10/12/2016   No results found for: [id number] Lab Results  Component Value Date   TSH 2.876 10/11/2016      ASSESSMENT AND PLAN  66 y.o. year old female  has a past medical history of Arrhythmia; Asthma; Diabetes mellitus; and GERD (gastroesophageal reflux disease). here  for hospital follow-up for stroke.MRI of the brain right caudate lacunar infarct secondary to small vessel disease on 10/11/16. She has risk factors of hyperlipidemia and uncontrolled diabetes  PLAN: Stressed the importance of management of risk factors to prevent further stroke Continue aspirin 325 mg for secondary stroke prevention Maintain strict control of hypertension with blood pressure goal below  130/90, today's reading 115/68  Control of diabetes with hemoglobin A1c below 6.5 followed by primary care most recent hemoglobin A1c8.8 continue diabetic medications Cholesterol with LDL cholesterol less than 70, followed by primary care,  most recent79 continue Lipitor Continue PT then HEP , exercise by walking, slowly increase , eat healthy diet with whole grains,  fresh fruits and vegetables May take  Tylenol for headache when necessary Follow up 6 months Discussed risk for recurrent stroke/ TIA and answered additional questions This was a  visit requiring 25 minutes of  medical decision making of high complexity with extensive review of history, hospital chart, counseling and answering questions. Dennie Bible, [facility], [facility], APRN  [facility] [address], [place], [place] [address] [phone]

## 2016-11-30 NOTE — Telephone Encounter (Signed)
I spoke to patient and she is aware of results and recommendations.  

## 2016-11-30 NOTE — Patient Instructions (Addendum)
Stressed the importance of management of risk factors to prevent further stroke Continue aspirin 325 mg for secondary stroke prevention Maintain strict control of hypertension with blood pressure goal below 130/90, today's reading 115/68  Control of diabetes with hemoglobin A1c below 6.5 followed by primary care most recent hemoglobin A1c8.8 continue diabetic medications Cholesterol with LDL cholesterol less than 70, followed by primary care,  most recent79 continue Lipitor Continue PT then HEP , exercise by walking, slowly increase , eat healthy diet with whole grains,  fresh fruits and vegetables Follow up 6 months

## 2016-12-01 NOTE — Progress Notes (Signed)
I reviewed above note and agree with the assessment and plan.  Rosalin Hawking, MD PhD Stroke Neurology 12/01/2016 8:24 AM

## 2016-12-02 ENCOUNTER — Ambulatory Visit: Payer: Medicare Other

## 2016-12-02 ENCOUNTER — Ambulatory Visit: Payer: Medicare Other | Admitting: Physical Therapy

## 2016-12-02 ENCOUNTER — Ambulatory Visit: Payer: Medicare Other | Attending: Family Medicine | Admitting: Occupational Therapy

## 2016-12-02 ENCOUNTER — Other Ambulatory Visit: Payer: Self-pay | Admitting: Family Medicine

## 2016-12-02 ENCOUNTER — Encounter: Payer: Self-pay | Admitting: Occupational Therapy

## 2016-12-02 DIAGNOSIS — R261 Paralytic gait: Secondary | ICD-10-CM | POA: Insufficient documentation

## 2016-12-02 DIAGNOSIS — R2681 Unsteadiness on feet: Secondary | ICD-10-CM

## 2016-12-02 DIAGNOSIS — R278 Other lack of coordination: Secondary | ICD-10-CM | POA: Diagnosis not present

## 2016-12-02 DIAGNOSIS — R41841 Cognitive communication deficit: Secondary | ICD-10-CM

## 2016-12-02 DIAGNOSIS — R2689 Other abnormalities of gait and mobility: Secondary | ICD-10-CM | POA: Diagnosis not present

## 2016-12-02 DIAGNOSIS — I69354 Hemiplegia and hemiparesis following cerebral infarction affecting left non-dominant side: Secondary | ICD-10-CM

## 2016-12-02 DIAGNOSIS — I69318 Other symptoms and signs involving cognitive functions following cerebral infarction: Secondary | ICD-10-CM | POA: Insufficient documentation

## 2016-12-02 DIAGNOSIS — R41842 Visuospatial deficit: Secondary | ICD-10-CM | POA: Insufficient documentation

## 2016-12-02 DIAGNOSIS — Z9181 History of falling: Secondary | ICD-10-CM | POA: Insufficient documentation

## 2016-12-02 DIAGNOSIS — M6281 Muscle weakness (generalized): Secondary | ICD-10-CM | POA: Insufficient documentation

## 2016-12-02 DIAGNOSIS — K219 Gastro-esophageal reflux disease without esophagitis: Secondary | ICD-10-CM

## 2016-12-02 DIAGNOSIS — R4701 Aphasia: Secondary | ICD-10-CM | POA: Insufficient documentation

## 2016-12-02 NOTE — Therapy (Signed)
[facility] [address] [place], [place], [address] Phone: [phone]   Fax:  [phone]  Occupational Therapy Treatment  Patient Details  Name: Wendy Mcclain MRN: [id number] Date of Birth: 1950/10/01 Referring Provider: Dr Ronnie Doss, DO  Encounter Date: 12/02/2016      OT End of Session - 12/02/16 0841    Visit Number 4   Number of Visits 17   Date for OT Re-Evaluation 01/18/17   Authorization Type Medicare -    Authorization Time Period G code and progress note every 10th visit.   Authorization - Visit Number 4   Authorization - Number of Visits 10   OT Start Time [phone]   OT Stop Time 0845   OT Time Calculation (min) 41 min   Activity Tolerance Patient tolerated treatment well   Behavior During Therapy WFL for tasks assessed/performed      Past Medical History:  Diagnosis Date  . Arrhythmia   . Asthma   . Diabetes mellitus   . GERD (gastroesophageal reflux disease)     Past Surgical History:  Procedure Laterality Date  . ABDOMINAL HYSTERECTOMY  1993  . APPENDECTOMY  1963  . BREAST ENHANCEMENT SURGERY  1986    There were no vitals filed for this visit.      Subjective Assessment - 12/02/16 0811    Subjective  Pt reports left arm discomfort/ numbness   Pertinent History Chest pain with excertion; type 2 DM (insulin dependent); Allergic rhinitis; GERD; Arrhythmia. 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 Yes   Pain Score 3    Pain Location Arm   Pain Orientation Left   Pain Type Chronic pain   Pain Onset More than a month ago   Pain Frequency Intermittent   Aggravating Factors  unknown   Pain Relieving Factors rest   Effect of Pain on Daily Activities limits  daily activities   Multiple Pain Sites No             Treatment:simple pipe tree design, pt was able to complete without assistance after organizing pieces by size as recommended by therapist. Mod complex pipetree design with 2 errors. Pt reports it helped to organize the pieces by size.                 OT Education - 12/02/16 0840    Education provided Yes   Education Details supine ball ex HEP, 10 reps each, min v.c.   Person(s) Educated Patient   Methods Explanation;Demonstration;Verbal cues;Handout   Comprehension Verbalized understanding;Returned demonstration;Verbal cues required          OT Short Term Goals - 11/18/16 1225      OT SHORT TERM GOAL #1   Title Pt will be Mod I HEP for coordaination LUE (Target date: 12/16/16)   Time 4   Period Weeks   Status New     OT SHORT TERM GOAL #2   Title Pt will be Mod I strengthening HEP for LUE (Target date 12/16/16)   Time 4   Period Weeks   Status New     OT SHORT TERM GOAL #3   Title Pt will be supervision simple functional reach mid level using L UE as assist to dominant RUE w/o LOB during simulated  ADL's   Time 4   Period Weeks   Status New           OT Long Term Goals - 11/18/16 1229      OT LONG TERM GOAL #1   Title Pt wil ldemonstrate improved coordination as seen by improved 9 hole peg test score left of 28 seconds or less & Mod I fastening buttons/fasteners when dressing.   Baseline 11/18/16: L = 34.81 seconds, R = 24.28 seconds; Min A fastening buttons, tying shoes etc.   Time 8   Period Weeks   Status New     OT LONG TERM GOAL #2   Title Pt will demonstrate improved LUE grip strength as seen by JAMAR grip assessment L of 15# or greater   Baseline 11/18/16 L = 4# vs R = 29# via JAMAR grip position II   Time 8   Period Weeks   Status New     OT LONG TERM GOAL #3   Title Pt will be Mod I simple snack prep in ADL kitchen w/o LOB   Time 8   Period Weeks   Status New     OT LONG  TERM GOAL #4   Title Pt will be supervision simple bill pay, check writing   Time 8   Period Weeks   Status New     OT LONG TERM GOAL #5   Title Pt will be supervision memory strategies and cognitive compensatory strategies during ADL tasks   Time 8   Period Weeks   Status New     Long Term Additional Goals   Additional Long Term Goals Yes     OT LONG TERM GOAL #6   Title Pt will be Mod I compensatory strategies due to visual changes during ADL/homemaking activities in ADL kitchen   Time 8   Period Weeks   Status New               Plan - 12/02/16 9371    Clinical Impression Statement Pt is progressing towards goals. She demonstrates improving cognitive and visual perceptual skills.   Rehab Potential Good   OT Frequency 2x / week   OT Duration 8 weeks   OT Treatment/Interventions Self-care/ADL training;Therapeutic exercise;Cognitive remediation/compensation;Neuromuscular education;Visual/perceptual remediation/compensation;Energy conservation;Therapist, nutritional;Therapeutic exercises;Patient/family education;DME and/or AE instruction;Therapeutic activities   Plan LUE coordintation, visual perceptual skills   Consulted and Agree with Plan of Care Patient      Patient will benefit from skilled therapeutic intervention in order to improve the following deficits and impairments:  Decreased cognition, Decreased knowledge of use of DME, Pain, Decreased coordination, Decreased mobility, Impaired sensation, Decreased strength, Decreased endurance, Decreased activity tolerance, Decreased range of motion, Decreased balance, Decreased knowledge of precautions, Decreased safety awareness, Impaired perceived functional ability  Visit Diagnosis: Other lack of coordination  Visuospatial deficit  Muscle weakness (generalized)  Hemiplegia and hemiparesis following cerebral infarction affecting left non-dominant side [facility])    Problem List Patient Active Problem List    Diagnosis Date Noted  . Left leg weakness   . Cerebrovascular accident (CVA) ([place])   . Hyperlipidemia   . OSA (obstructive sleep apnea)   . Left-sided weakness 10/11/2016  . Orthostatic hypotension 09/18/2015  . Chest pain 08/15/2015  . Acute sinusitis 08/19/2014  . Yeast infection 08/19/2014  . Vitamin D deficiency 05/20/2014  . Gastro-esophageal reflux 03/20/2014  . Allergic rhinitis 07/09/2013  . Sleep disorder 07/09/2013  . Elevated blood pressure (not hypertension) 07/09/2013  . Mood disorder ([place])  11/23/2011  . Diabetes mellitus ([place]) 11/23/2011  . Health care maintenance 11/23/2011    RINE,KATHRYN 12/02/2016, 8:42 AM  [facility] [address] [place], [place], [address] Phone: [phone]   Fax:  [phone]  Name: Wendy Mcclain MRN: [id number] Date of Birth: 1951-03-24

## 2016-12-02 NOTE — Patient Instructions (Signed)
  Please complete the assigned speech therapy homework prior to your next session and return it to the speech therapist at your next visit.  

## 2016-12-02 NOTE — Therapy (Signed)
[facility] [address] [place], [place], [address] Phone: [phone]   Fax:  [phone]  Physical Therapy Treatment  Patient Details  Name: Wendy Mcclain MRN: [id number] Date of Birth: 08/24/1950 Referring Provider: Ronnie Doss DO  Talbert Cage MD)  Encounter Date: 12/02/2016      PT End of Session - 12/02/16 1416    Visit Number 5   Number of Visits 17   Date for PT Re-Evaluation 01/14/17   Authorization Type Medicare & G-codes    PT Start Time 0933   PT Stop Time 1013   PT Time Calculation (min) 40 min   Equipment Utilized During Treatment Gait belt   Activity Tolerance Patient tolerated treatment well   Behavior During Therapy [facility] for tasks assessed/performed      Past Medical History:  Diagnosis Date  . Arrhythmia   . Asthma   . Diabetes mellitus   . GERD (gastroesophageal reflux disease)     Past Surgical History:  Procedure Laterality Date  . ABDOMINAL HYSTERECTOMY  1993  . APPENDECTOMY  1963  . BREAST ENHANCEMENT SURGERY  1986    There were no vitals filed for this visit.      Subjective Assessment - 12/02/16 0931    Subjective Still having a little difficulty with balance and walking.  Still a little weakness on L side.  No falls.   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 Yes   Pain Score 3    Pain Location Arm   Pain Orientation Left   Pain Descriptors / Indicators Numbness   Pain Type Chronic pain   Pain Onset More than a month ago   Aggravating Factors  unknown   Pain Relieving Factors rest                         [facility] Adult PT  Treatment/Exercise - 12/02/16 0938      Transfers   Transfers Sit to Stand;Stand to Sit   Sit to Stand 6: Modified independent (Device/Increase time);Without upper extremity assist;5: Supervision;From bed;From chair/3-in-1   Five time sit to stand comments  5 reps from 22" surface, then 5 reps from 18" chair   Stand to Sit 5: Supervision;Without upper extremity assist;To chair/3-in-1;To bed     Ambulation/Gait   Ambulation/Gait Yes   Ambulation/Gait Assistance 5: Supervision;4: Min guard   Ambulation Distance (Feet) 230 Feet  then 350   Assistive device None  walking poles to facilitate arm swing   Gait Pattern Step-through pattern;Decreased stride length;Decreased step length - right;Decreased step length - left;Trunk flexed;Narrow base of support;Decreased trunk rotation   Ambulation Surface Level;Indoor   Gait Comments Use of bilateral walking poles to attempt to improve reciprocal armswing.  Pt needs frequent cues for relaxed arm swing and PT provides tactile cues through shoulders/trunk for improved trunk rotation.             Balance Exercises - 12/02/16 0959      Balance Exercises: Standing   SLS Eyes open;Solid surface  Consecutive step taps x 10 to 6" step   SLS with Vectors Solid surface  alternating step tapx x 15 to  6" step, x 10 to 12" step   Stepping Strategy Anterior;Lateral;10 reps  stepping over obstacle intermittent UE support   Step Ups Forward;6 inch;UE support 2  x 10 reps, cues for lessened UE support   Balance Beam Compliant surface marching in place x 10 reps, forward kicks x 10, then forward step taps x 10.  PT provides min guard/supervision for balance, compliant surface activities.  PT provides cues for upright posture, use of visual target, and abdominal activation for improved upright posture and stability during balance activities.   Gait with Head Turns Forward  30 ft, pt tends to look quickly side to side, supervision   Tandem Gait  Forward;Retro;Intermittent upper extremity support  Solid surface and foam, 2 reps each x 10-15 ft   Sidestepping Upper extremity support;Foam/compliant support;2 reps  along counter   Marching Limitations marching forward/back on solid surface, then on foam, 2 reps, 10-15 ft, intermittent UE support   Other Standing Exercises In parallel bars-progression of tandem gait>marching>tandem march forward and back direction, 2 reps x 10 feet each direction, with UE support.             PT Short Term Goals - 11/16/16 1330      PT SHORT TERM GOAL #1   Title Patient will verbalize understanding of initial HEP to decrease her risk of falling. (TARGET DATE: 12/17/2016)   Time 4   Period Weeks   Status New     PT SHORT TERM GOAL #2   Title Patient will achieve a cognitive TUG score of < or = 18 seconds in order to decrease her risk of falling. (TARGET DATE: 12/17/2016)   Time 4   Period Weeks   Status New     PT SHORT TERM GOAL #3   Title Patient will demonstrate an ability to ambulate 500 feet with supervision and LRAD to indicate a decrease in her risk of falling. (TARGET DATE: 12/17/2016)   Time 4   Period Weeks   Status New           PT Long Term Goals - 11/16/16 1334      PT LONG TERM GOAL #1   Title Patient will verbalize understanidng of HEP and ongoing fitness program. (TARGET DATE: 01/14/2017)    Time 8   Period Weeks   Status New     PT LONG TERM GOAL #2   Title Patient will achieve a score of >or = 45 to demonstrate a decrease in her risk of falling. (TARGET DATE: 01/14/2017)   Time 8   Period Weeks   Status New     PT LONG TERM GOAL #3   Title Patient will demonstrate ability to ambulate 1000 feet without AD including unlevel surfaces (grass, curbs, ramps) with supervision to indicate a return to patient's prior level of function. (TARGET DATE: 01/14/2017)   Time 8   Period Weeks   Status New     PT LONG TERM GOAL #4   Title Patient will acheive a functional gait  assessment score of > or = 19 to indicate a decrease in her risk of falling. (TARGET DATE: 01/14/2017)   Time 8   Period Weeks   Status New     PT LONG TERM GOAL #5   Title Patient will achieve a cognitive TUG score of < or = 15.5 seconds to indicate a decrease in her risk of falling. (TARGET DATE: 01/14/2017)   Time 8   Period Weeks   Status New  Plan - 12/02/16 1417    Clinical Impression Statement Focused most of session balance activities on solid surface/compliant surface.  Without UE support, pt requires min guard assistance.  With compliant surfaces, pt noted to have widened BOS for balance.  With gait, pt continues to have difficulty with relaxed, natural sequence of reciprocal arm swing.  Pt will continue to benefit from skilled PT to address strength, balance and gait.   Rehab Potential Good   Clinical Impairments Affecting Rehab Potential chest pain with exertion, type 2 DM, allergic rhinitis, GERD, arrhythmia    PT Frequency 2x / week   PT Duration 8 weeks   PT Treatment/Interventions ADLs/Self Care Home Management;Electrical Stimulation;DME Instruction;Gait training;Stair training;Functional mobility training;Therapeutic activities;Therapeutic exercise;Balance training;Patient/family education;Neuromuscular re-education;Manual techniques   PT Next Visit Plan Compliant surface, SLS, step strategy work; progression of dynamic balance and gait    Consulted and Agree with Plan of Care Patient      Patient will benefit from skilled therapeutic intervention in order to improve the following deficits and impairments:  Abnormal gait, Decreased activity tolerance, Decreased balance, Decreased knowledge of precautions, Decreased endurance, Decreased knowledge of use of DME, Decreased mobility, Decreased range of motion, Decreased safety awareness, Difficulty walking, Decreased strength, Impaired sensation, Impaired tone, Postural dysfunction, Pain  Visit  Diagnosis: Other abnormalities of gait and mobility  Unsteadiness on feet     Problem List Patient Active Problem List   Diagnosis Date Noted  . Left leg weakness   . Cerebrovascular accident (CVA) ([place])   . Hyperlipidemia   . OSA (obstructive sleep apnea)   . Left-sided weakness 10/11/2016  . Orthostatic hypotension 09/18/2015  . Chest pain 08/15/2015  . Acute sinusitis 08/19/2014  . Yeast infection 08/19/2014  . Vitamin D deficiency 05/20/2014  . Gastro-esophageal reflux 03/20/2014  . Allergic rhinitis 07/09/2013  . Sleep disorder 07/09/2013  . Elevated blood pressure (not hypertension) 07/09/2013  . Mood disorder ([place]) 11/23/2011  . Diabetes mellitus ([place]) 11/23/2011  . Health care maintenance 11/23/2011    Jontavia Leatherbury W. 12/02/2016, 2:22 PM  Frazier Butt., PT  [facility] [address] [place], [place], [address] Phone: [phone]   Fax:  [phone]  Name: CANDIA KINGSBURY MRN: [id number] Date of Birth: Jul 21, 1951

## 2016-12-02 NOTE — Patient Instructions (Signed)
Laying on your back,  hold a ball between your hands , raise arms up to just above shoulder height without elevating shoulder or arching back,then  lower ball back to your lap. Keep elbows straight.   Perform 2 sets of 10 reps 1x day  Laying on your back, hold a ball in between both hands, straighten elbows, then bend elbows and bring ball to your chest     Perform 2 sets of 10 reps 1x day

## 2016-12-02 NOTE — Therapy (Signed)
[facility] [address] [place], [place], [address] Phone: [phone]   Fax:  [phone]  Speech Language Pathology Treatment  Patient Details  Name: Wendy Mcclain MRN: [id number] Date of Birth: [birth year] Referring Provider: Janora Norlander, DO  Encounter Date: 12/02/2016      End of Session - 12/02/16 1209    Visit Number 4   Number of Visits 17   Date for SLP Re-Evaluation 01/14/17   SLP Start Time 0850   SLP Stop Time  0930   SLP Time Calculation (min) 40 min   Activity Tolerance Patient tolerated treatment well      Past Medical History:  Diagnosis Date  . Arrhythmia   . Asthma   . Diabetes mellitus   . GERD (gastroesophageal reflux disease)     Past Surgical History:  Procedure Laterality Date  . ABDOMINAL HYSTERECTOMY  1993  . APPENDECTOMY  1963  . BREAST ENHANCEMENT SURGERY  1986    There were no vitals filed for this visit.      Subjective Assessment - 12/02/16 0859    Subjective Pt recalled tasks in OT with good/excellent success.               ADULT SLP TREATMENT - 12/02/16 0903      General Information   Behavior/Cognition Alert;Pleasant mood;Cooperative     Pain Assessment   Pain Assessment 0-10   Pain Score 3    Pain Location lt arm and face - transient   Pain Descriptors / Indicators Numbness;Aching   Pain Intervention(s) Monitored during session     Cognitive-Linquistic Treatment   Treatment focused on Cognition   Skilled Treatment Pt still with one homework task not completed. "After I read one of these clues I forget what it says." SLP encouraged pt to put clues into written form so that she will not forget. Pt has not transferred appointments nor ST and OT homework times to her planner. SLP encouraged pt to do so. Attention to detail and alternating attention targeted in deductive reasoning puzzle; subjectively, pt took longer than expected when going back to  task from conversation with SLP. REasoning skills appeared functional (not WNL) for this simple task.      Assessment / Recommendations / Plan   Plan Continue with current plan of care     Progression Toward Goals   Progression toward goals Progressing toward goals            SLP Short Term Goals - 12/02/16 0902      SLP SHORT TERM GOAL #1   Title Pt will attend to details in mildly complex cognitive linguistic tasks with 85% accuracy and occasional min A   Time 3   Period Weeks   Status On-going     SLP SHORT TERM GOAL #2   Title Pt will complete various naming tasks of increasing complexity with 90% success over 2 sessions, given mod assist   Time 3   Period Weeks   Status On-going     SLP SHORT TERM GOAL #3   Title Pt will implement and use a system for functional recall of daily and scheduled activities with mod assistance over 2 sessions   Time 3   Period Weeks   Status On-going          SLP Long Term Goals - 12/02/16 1210      SLP LONG TERM GOAL #1   Title Pt will alternate attention between 2 mildly  complex cognitive linguistic tasks with 85% on each and occasional min A   Time 7   Period Weeks   Status On-going     SLP LONG TERM GOAL #2   Title Pt will complete Level III (abstract) naming tasks with 90% accuracy without cues   Time 7   Period Weeks   Status On-going     SLP LONG TERM GOAL #3   Title Pt will report daily functional and effective use of compensatory strategies for recall of daily activities and appointments.    Time 7   Period Weeks   Status On-going     SLP LONG TERM GOAL #4   Title pt will tell SLP 4 memory strategies over two sessions with modified independence   Time 7   Period Weeks   Status On-going          Plan - 12/02/16 1210    Clinical Impression Statement Deficits in attention, memory and rarely in wordfinding were seen today in therapy. SLP req'd to provide cues rarely in a mod complex organizational task. Error  awareness was suboptimal. Skilled ST needs to cont to address attention, memory and wordfinding deficits.   Speech Therapy Frequency 2x / week   Duration Other (comment)   Treatment/Interventions Language facilitation;Environmental controls;Cueing hierarchy;SLP instruction and feedback;Compensatory techniques;Cognitive reorganization;Functional tasks;Multimodal communcation approach;Patient/family education;Internal/external aids   Potential to Achieve Goals Good   SLP Home Exercise Plan review attention/memory strategies worksheet, carry a notebook   Consulted and Agree with Plan of Care Patient      Patient will benefit from skilled therapeutic intervention in order to improve the following deficits and impairments:   Cognitive communication deficit    Problem List Patient Active Problem List   Diagnosis Date Noted  . Left leg weakness   . Cerebrovascular accident (CVA) ([place])   . Hyperlipidemia   . OSA (obstructive sleep apnea)   . Left-sided weakness 10/11/2016  . Orthostatic hypotension 09/18/2015  . Chest pain 08/15/2015  . Acute sinusitis 08/19/2014  . Yeast infection 08/19/2014  . Vitamin D deficiency 05/20/2014  . Gastro-esophageal reflux 03/20/2014  . Allergic rhinitis 07/09/2013  . Sleep disorder 07/09/2013  . Elevated blood pressure (not hypertension) 07/09/2013  . Mood disorder ([place]) 11/23/2011  . Diabetes mellitus ([place]) 11/23/2011  . Health care maintenance 11/23/2011    [facility] ,[place], [place]  12/02/2016, 12:11 PM  [facility] [address] [place], [place], [address] Phone: [phone]   Fax:  [phone]   Name: Wendy Mcclain MRN: [id number] Date of Birth: [birth year]

## 2016-12-03 ENCOUNTER — Ambulatory Visit: Payer: Medicare Other | Admitting: Occupational Therapy

## 2016-12-03 ENCOUNTER — Ambulatory Visit: Payer: Medicare Other

## 2016-12-03 ENCOUNTER — Encounter: Payer: Self-pay | Admitting: Physical Therapy

## 2016-12-03 ENCOUNTER — Ambulatory Visit: Payer: Medicare Other | Admitting: Physical Therapy

## 2016-12-03 DIAGNOSIS — R41842 Visuospatial deficit: Secondary | ICD-10-CM | POA: Diagnosis not present

## 2016-12-03 DIAGNOSIS — R2689 Other abnormalities of gait and mobility: Secondary | ICD-10-CM | POA: Diagnosis not present

## 2016-12-03 DIAGNOSIS — M6281 Muscle weakness (generalized): Secondary | ICD-10-CM

## 2016-12-03 DIAGNOSIS — Z9181 History of falling: Secondary | ICD-10-CM

## 2016-12-03 DIAGNOSIS — I69354 Hemiplegia and hemiparesis following cerebral infarction affecting left non-dominant side: Secondary | ICD-10-CM | POA: Diagnosis not present

## 2016-12-03 DIAGNOSIS — R2681 Unsteadiness on feet: Secondary | ICD-10-CM

## 2016-12-03 DIAGNOSIS — R41841 Cognitive communication deficit: Secondary | ICD-10-CM

## 2016-12-03 DIAGNOSIS — R278 Other lack of coordination: Secondary | ICD-10-CM

## 2016-12-03 NOTE — Therapy (Signed)
[facility] [address] [place], [place], [address] Phone: [phone]   Fax:  [phone]  Physical Therapy Treatment  Patient Details  Name: Wendy Mcclain MRN: [id number] Date of Birth: 05-17-1951 Referring Provider: Ronnie Doss DO  Talbert Cage MD)  Encounter Date: 12/03/2016      PT End of Session - 12/03/16 1103    Visit Number 6   Number of Visits 17   Date for PT Re-Evaluation 01/14/17   Authorization Type Medicare & G-codes    PT Start Time 1101   PT Stop Time 1145   PT Time Calculation (min) 44 min   Equipment Utilized During Treatment Gait belt   Activity Tolerance Patient tolerated treatment well   Behavior During Therapy WFL for tasks assessed/performed      Past Medical History:  Diagnosis Date  . Arrhythmia   . Asthma   . Diabetes mellitus   . GERD (gastroesophageal reflux disease)     Past Surgical History:  Procedure Laterality Date  . ABDOMINAL HYSTERECTOMY  1993  . APPENDECTOMY  1963  . BREAST ENHANCEMENT SURGERY  1986    There were no vitals filed for this visit.      Subjective Assessment - 12/03/16 1103    Subjective Having tingling in left arm, some in the leg just not falls as much. No fall or pain to report.    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 No/denies   Pain Score 0-No pain            [facility] Adult PT Treatment/Exercise - 12/03/16 1104      Transfers   Transfers Sit to Stand;Stand to Sit   Sit to Stand 6: Modified independent (Device/Increase time);With upper extremity assist;Without upper extremity assist;From bed;From  chair/3-in-1   Stand to Sit 6: Modified independent (Device/Increase time);Without upper extremity assist;With upper extremity assist;To bed;To chair/3-in-1     Ambulation/Gait   Ambulation/Gait Yes   Ambulation/Gait Assistance 5: Supervision   Ambulation/Gait Assistance Details no scuffing or balance loss noted. occasional cues to relax UE's and for reciprocal arm swing with gait.    Ambulation Distance (Feet) 1000 Feet   Assistive device None   Gait Pattern Step-through pattern;Decreased arm swing - left;Narrow base of support;Decreased trunk rotation   Ambulation Surface Level;Unlevel;Indoor;Outdoor;Paved;Gravel;Grass          Balance Exercises - 12/03/16 1123      Balance Exercises: Standing   Standing Eyes Closed Narrow base of support (BOS);Foam/compliant surface;Other reps (comment);20 secs;Limitations   SLS with Vectors Foam/compliant surface;Other reps (comment);Limitations   Other Standing Exercises gait along ~50 foot hallway: forward gait with head movements left<>right and up<>down x 4 laps each with min guard assit for balance.  decr gait speed noted with head movements, minor veering noted.      Balance Exercises: Standing   Standing Eyes Closed Limitations on airex with chair in front for safety: EC no head movements, progressing to EC with head movements left<>right, up<>down and diagonals both ways x 10 reps each. min guard to min assist for balance. incr postural sway with head movements needing incr assistance for balance.    SLS with Vectors Limitations 2  tall cones on  blue mat: alternating fwd toe taps, alternating cross toe taps, alternating fwd double toe taps and alternating cross double toe taps x 10 reps each/bil legs with min guard to min assist for balance, cues on base of support and weight shifting to assist with balance.                      PT Short Term Goals - 11/16/16 1330      PT SHORT TERM GOAL #1   Title Patient will verbalize understanding  of initial HEP to decrease her risk of falling. (TARGET DATE: 12/17/2016)   Time 4   Period Weeks   Status New     PT SHORT TERM GOAL #2   Title Patient will achieve a cognitive TUG score of < or = 18 seconds in order to decrease her risk of falling. (TARGET DATE: 12/17/2016)   Time 4   Period Weeks   Status New     PT SHORT TERM GOAL #3   Title Patient will demonstrate an ability to ambulate 500 feet with supervision and LRAD to indicate a decrease in her risk of falling. (TARGET DATE: 12/17/2016)   Time 4   Period Weeks   Status New           PT Long Term Goals - 11/16/16 1334      PT LONG TERM GOAL #1   Title Patient will verbalize understanidng of HEP and ongoing fitness program. (TARGET DATE: 01/14/2017)    Time 8   Period Weeks   Status New     PT LONG TERM GOAL #2   Title Patient will achieve a score of >or = 45 to demonstrate a decrease in her risk of falling. (TARGET DATE: 01/14/2017)   Time 8   Period Weeks   Status New     PT LONG TERM GOAL #3   Title Patient will demonstrate ability to ambulate 1000 feet without AD including unlevel surfaces (grass, curbs, ramps) with supervision to indicate a return to patient's prior level of function. (TARGET DATE: 01/14/2017)   Time 8   Period Weeks   Status New     PT LONG TERM GOAL #4   Title Patient will acheive a functional gait assessment score of > or = 19 to indicate a decrease in her risk of falling. (TARGET DATE: 01/14/2017)   Time 8   Period Weeks   Status New     PT LONG TERM GOAL #5   Title Patient will achieve a cognitive TUG score of < or = 15.5 seconds to indicate a decrease in her risk of falling. (TARGET DATE: 01/14/2017)   Time 8   Period Weeks   Status New             Plan - 12/03/16 1104    Clinical Impression Statement Today's skilled session continued to focus on gait and high level balance activities. Pt is making steady progress toward goals and should benefit from continued PT to progress  toward unmet goals.    Rehab Potential Good   Clinical Impairments Affecting Rehab Potential chest pain with exertion, type 2 DM, allergic rhinitis, GERD, arrhythmia    PT Frequency 2x / week   PT Duration 8 weeks   PT Treatment/Interventions ADLs/Self Care Home Management;Electrical Stimulation;DME Instruction;Gait training;Stair training;Functional mobility training;Therapeutic activities;Therapeutic exercise;Balance training;Patient/family education;Neuromuscular re-education;Manual techniques   PT Next Visit Plan Compliant surface, SLS, step strategy work; progression of dynamic balance and  gait    Consulted and Agree with Plan of Care Patient      Patient will benefit from skilled therapeutic intervention in order to improve the following deficits and impairments:  Abnormal gait, Decreased activity tolerance, Decreased balance, Decreased knowledge of precautions, Decreased endurance, Decreased knowledge of use of DME, Decreased mobility, Decreased range of motion, Decreased safety awareness, Difficulty walking, Decreased strength, Impaired sensation, Impaired tone, Postural dysfunction, Pain  Visit Diagnosis: Unsteadiness on feet  Muscle weakness (generalized)  Other abnormalities of gait and mobility  History of falling  Hemiplegia and hemiparesis following cerebral infarction affecting left non-dominant side [facility])     Problem List Patient Active Problem List   Diagnosis Date Noted  . Left leg weakness   . Cerebrovascular accident (CVA) ([place])   . Hyperlipidemia   . OSA (obstructive sleep apnea)   . Left-sided weakness 10/11/2016  . Orthostatic hypotension 09/18/2015  . Chest pain 08/15/2015  . Acute sinusitis 08/19/2014  . Yeast infection 08/19/2014  . Vitamin D deficiency 05/20/2014  . Gastro-esophageal reflux 03/20/2014  . Allergic rhinitis 07/09/2013  . Sleep disorder 07/09/2013  . Elevated blood pressure (not hypertension) 07/09/2013  . Mood disorder ([place])  11/23/2011  . Diabetes mellitus ([place]) 11/23/2011  . Health care maintenance 11/23/2011    Willow Ora, PTA, [facility] [address] [place], [place] [address] [phone] 12/03/16, 9:19 PM   Name: Wendy Mcclain MRN: [id number] Date of Birth: June 01, 1951

## 2016-12-03 NOTE — Patient Instructions (Signed)
Slow down take a deep breath and refocus yourself if you feel you're getting "lost"

## 2016-12-03 NOTE — Therapy (Signed)
[facility] [address] [place], [place], [address] Phone: [phone]   Fax:  [phone]  Occupational Therapy Treatment  Patient Details  Name: Wendy Mcclain MRN: [id number] Date of Birth: 22-Dec-1950 Referring Provider: Dr Ronnie Doss, DO  Encounter Date: 12/03/2016      OT End of Session - 12/03/16 1206    Visit Number 4   Number of Visits 17   Date for OT Re-Evaluation 01/18/17   Authorization Type Medicare -    Authorization Time Period G code and progress note every 10th visit.   Authorization - Visit Number 4   Authorization - Number of Visits 10   OT Start Time 1150   OT Stop Time 1230   OT Time Calculation (min) 40 min   Activity Tolerance Patient tolerated treatment well   Behavior During Therapy WFL for tasks assessed/performed      Past Medical History:  Diagnosis Date  . Arrhythmia   . Asthma   . Diabetes mellitus   . GERD (gastroesophageal reflux disease)     Past Surgical History:  Procedure Laterality Date  . ABDOMINAL HYSTERECTOMY  1993  . APPENDECTOMY  1963  . BREAST ENHANCEMENT SURGERY  1986    There were no vitals filed for this visit.      Subjective Assessment - 12/03/16 1155    Pertinent History Chest pain with excertion; type 2 DM (insulin dependent); Allergic rhinitis; GERD; Arrhythmia. 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 Yes   Pain Score 2    Pain Location Arm   Pain Orientation Left   Pain Descriptors / Indicators Numbness   Pain Type Chronic pain   Pain Onset More than a month ago   Pain Frequency Intermittent   Aggravating Factors  unknown   Pain Relieving Factors rest   Effect of Pain on Daily Activities limits daily activities                   Standing for copying small peg design with LUE for increased functional reach, visual perceptual skills and cognition, only 2 pegs dropped, pt copied design correctly without prompting. Environmental scanning, 3/14 cards missed first pass, pt located remaining cards on second pass.             OT Education - 12/03/16   Education provided Yes   Education Details supine ball ex HEP review 15 reps each, min v.c.   Person(s) Educated Patient   Methods Explanation;Demonstration;Verbal cues;Handout   Comprehension Verbalized understanding;Returned demonstration;Verbal cues required          OT Short Term Goals - 11/18/16 1225      OT SHORT TERM GOAL #1   Title Pt will be Mod I HEP for coordaination LUE (Target date: 12/16/16)   Time 4   Period Weeks   Status New     OT SHORT TERM GOAL #2   Title Pt will be Mod I strengthening HEP for LUE (Target date 12/16/16)   Time 4   Period Weeks   Status New     OT SHORT TERM GOAL #3   Title Pt will be supervision simple functional reach mid level using L UE as assist to dominant RUE w/o LOB during simulated ADL's   Time 4  Period Weeks   Status New           OT Long Term Goals - 11/18/16 1229      OT LONG TERM GOAL #1   Title Pt wil ldemonstrate improved coordination as seen by improved 9 hole peg test score left of 28 seconds or less & Mod I fastening buttons/fasteners when dressing.   Baseline 11/18/16: L = 34.81 seconds, R = 24.28 seconds; Min A fastening buttons, tying shoes etc.   Time 8   Period Weeks   Status New     OT LONG TERM GOAL #2   Title Pt will demonstrate improved LUE grip strength as seen by JAMAR grip assessment L of 15# or greater   Baseline 11/18/16 L = 4# vs R = 29# via JAMAR grip position II   Time 8   Period Weeks   Status New     OT LONG TERM GOAL #3   Title Pt will be Mod I simple snack prep in ADL kitchen w/o LOB   Time 8   Period Weeks   Status New     OT  LONG TERM GOAL #4   Title Pt will be supervision simple bill pay, check writing   Time 8   Period Weeks   Status New     OT LONG TERM GOAL #5   Title Pt will be supervision memory strategies and cognitive compensatory strategies during ADL tasks   Time 8   Period Weeks   Status New     Long Term Additional Goals   Additional Long Term Goals Yes     OT LONG TERM GOAL #6   Title Pt will be Mod I compensatory strategies due to visual changes during ADL/homemaking activities in ADL kitchen   Time 8   Period Weeks   Status New               Plan - 12/03/16 1204    Clinical Impression Statement Pt is progressing towards goals. she demonstrates improving LUe coordination and functional use.   Rehab Potential Good   OT Frequency 2x / week   OT Duration 8 weeks   OT Treatment/Interventions Self-care/ADL training;Therapeutic exercise;Cognitive remediation/compensation;Neuromuscular education;Visual/perceptual remediation/compensation;Energy conservation;Therapist, nutritional;Therapeutic exercises;Patient/family education;DME and/or AE instruction;Therapeutic activities   Plan LUE functional use, coordination and environmental scanning   Consulted and Agree with Plan of Care Patient      Patient will benefit from skilled therapeutic intervention in order to improve the following deficits and impairments:  Decreased cognition, Decreased knowledge of use of DME, Pain, Decreased coordination, Decreased mobility, Impaired sensation, Decreased strength, Decreased endurance, Decreased activity tolerance, Decreased range of motion, Decreased balance, Decreased knowledge of precautions, Decreased safety awareness, Impaired perceived functional ability  Visit Diagnosis: Other lack of coordination  Unsteadiness on feet  Visuospatial deficit  Muscle weakness (generalized)    Problem List Patient Active Problem List   Diagnosis Date Noted  . Left leg weakness   .  Cerebrovascular accident (CVA) ([place])   . Hyperlipidemia   . OSA (obstructive sleep apnea)   . Left-sided weakness 10/11/2016  . Orthostatic hypotension 09/18/2015  . Chest pain 08/15/2015  . Acute sinusitis 08/19/2014  . Yeast infection 08/19/2014  . Vitamin D deficiency 05/20/2014  . Gastro-esophageal reflux 03/20/2014  . Allergic rhinitis 07/09/2013  . Sleep disorder 07/09/2013  . Elevated blood pressure (not hypertension) 07/09/2013  . Mood disorder ([place]) 11/23/2011  . Diabetes mellitus ([place]) 11/23/2011  . Health care maintenance 11/23/2011  Cristi Gwynn 12/03/2016, 1:00 PM  [facility] [address] [place], [place], [address] Phone: [phone]   Fax:  [phone]  Name: Wendy Mcclain MRN: [id number] Date of Birth: [birth year]

## 2016-12-03 NOTE — Therapy (Signed)
[facility] [address] [place], [place], [address] Phone: [phone]   Fax:  [phone]  Speech Language Pathology Treatment  Patient Details  Name: Wendy Mcclain MRN: [id number] Date of Birth: 1950/10/27 Referring Provider: Janora Norlander, DO  Encounter Date: 12/03/2016      End of Session - 12/03/16 1110    Visit Number 5   Number of Visits 17   Date for SLP Re-Evaluation 01/14/17   SLP Start Time 70   SLP Stop Time  1100   SLP Time Calculation (min) 44 min   Activity Tolerance Patient tolerated treatment well      Past Medical History:  Diagnosis Date  . Arrhythmia   . Asthma   . Diabetes mellitus   . GERD (gastroesophageal reflux disease)     Past Surgical History:  Procedure Laterality Date  . ABDOMINAL HYSTERECTOMY  1993  . APPENDECTOMY  1963  . BREAST ENHANCEMENT SURGERY  1986    There were no vitals filed for this visit.      Subjective Assessment - 12/03/16 1023    Subjective Pt without planner - did not purchase but said she would over the wekeend.   Currently in Pain? Yes   Pain Score 1    Pain Location Arm   Pain Orientation Left   Pain Descriptors / Indicators Numbness   Pain Type Chronic pain               ADULT SLP TREATMENT - 12/03/16 1024      General Information   Behavior/Cognition Alert;Pleasant mood;Cooperative     Treatment Provided   Treatment provided Cognitive-Linquistic     Cognitive-Linquistic Treatment   Treatment focused on Cognition   Skilled Treatment Pt completed all homework, stated the school schedule sheet was hardest. She did not correctly change options for task SLP wanted her to correct. SLP guided pt through school schedule worksheet (deductive reasoning - mod complex/complex). Pt req'd initial A for executive function/planning on how to best achieve success with school schedule. With simple-mod complex deductive reasoning tasks pt  req'd less cues for the second task with same skills needed for today's homework, however it appeared her attention waned in last 4 minutes of task and she req'd cues to refocus with closing eyes and taking deep breaths. SLP encouraged pt to use this strategy PRN.      Assessment / Recommendations / Plan   Plan Continue with current plan of care     Progression Toward Goals   Progression toward goals Progressing toward goals          SLP Education - 12/03/16 1110    Education provided Yes   Education Details compensation for refocusing   Person(s) Educated Patient   Methods Explanation   Comprehension Verbalized understanding;Returned demonstration          SLP Short Term Goals - 12/03/16 1111      SLP SHORT TERM GOAL #1   Title Pt will attend to details in mildly complex cognitive linguistic tasks with 85% accuracy and occasional min A   Time 3   Period Weeks   Status On-going     SLP SHORT TERM GOAL #2   Title Pt will complete various naming tasks of increasing complexity with 90% success over 2 sessions, given mod assist   Time 3   Period Weeks   Status On-going     SLP SHORT TERM GOAL #3   Title Pt will implement  and use a system for functional recall of daily and scheduled activities with mod assistance over 2 sessions   Time 3   Period Weeks   Status On-going          SLP Long Term Goals - 12/03/16 1111      SLP LONG TERM GOAL #1   Title Pt will alternate attention between 2 mildly complex cognitive linguistic tasks with 85% on each and occasional min A   Time 7   Period Weeks   Status On-going     SLP LONG TERM GOAL #2   Title Pt will complete Level III (abstract) naming tasks with 90% accuracy without cues   Time 7   Period Weeks   Status On-going     SLP LONG TERM GOAL #3   Title Pt will report daily functional and effective use of compensatory strategies for recall of daily activities and appointments.    Time 7   Period Weeks   Status  On-going     SLP LONG TERM GOAL #4   Title pt will tell SLP 4 memory strategies over two sessions with modified independence   Time 7   Period Weeks   Status On-going          Plan - 12/03/16 1111    Clinical Impression Statement Deficits in attention and memory were again seen today in therapy. SLP req'd to provide cues rarely in a mod complex organizational task. Error awareness cont'd as suboptimal. Skilled ST needs to cont to address attention, memory and wordfinding deficits.   Speech Therapy Frequency 2x / week   Duration Other (comment)   Treatment/Interventions Language facilitation;Environmental controls;Cueing hierarchy;SLP instruction and feedback;Compensatory techniques;Cognitive reorganization;Functional tasks;Multimodal communcation approach;Patient/family education;Internal/external aids   Potential to Achieve Goals Good   SLP Home Exercise Plan review attention/memory strategies worksheet, carry a notebook   Consulted and Agree with Plan of Care Patient      Patient will benefit from skilled therapeutic intervention in order to improve the following deficits and impairments:   Cognitive communication deficit    Problem List Patient Active Problem List   Diagnosis Date Noted  . Left leg weakness   . Cerebrovascular accident (CVA) ([place])   . Hyperlipidemia   . OSA (obstructive sleep apnea)   . Left-sided weakness 10/11/2016  . Orthostatic hypotension 09/18/2015  . Chest pain 08/15/2015  . Acute sinusitis 08/19/2014  . Yeast infection 08/19/2014  . Vitamin D deficiency 05/20/2014  . Gastro-esophageal reflux 03/20/2014  . Allergic rhinitis 07/09/2013  . Sleep disorder 07/09/2013  . Elevated blood pressure (not hypertension) 07/09/2013  . Mood disorder ([place]) 11/23/2011  . Diabetes mellitus ([place]) 11/23/2011  . Health care maintenance 11/23/2011    [facility] ,[place], [place]  12/03/2016, 11:12 AM  [facility] [address] [place], [place], [address] Phone: [phone]   Fax:  [phone]   Name: Wendy Mcclain MRN: [id number] Date of Birth: Sep 18, 1950

## 2016-12-06 ENCOUNTER — Ambulatory Visit: Payer: Medicare Other | Admitting: Occupational Therapy

## 2016-12-06 ENCOUNTER — Ambulatory Visit: Payer: Medicare Other | Admitting: Physical Therapy

## 2016-12-06 ENCOUNTER — Ambulatory Visit: Payer: Medicare Other | Admitting: Speech Pathology

## 2016-12-06 ENCOUNTER — Encounter: Payer: Self-pay | Admitting: Physical Therapy

## 2016-12-06 DIAGNOSIS — R278 Other lack of coordination: Secondary | ICD-10-CM | POA: Diagnosis not present

## 2016-12-06 DIAGNOSIS — R41842 Visuospatial deficit: Secondary | ICD-10-CM

## 2016-12-06 DIAGNOSIS — R2689 Other abnormalities of gait and mobility: Secondary | ICD-10-CM

## 2016-12-06 DIAGNOSIS — R2681 Unsteadiness on feet: Secondary | ICD-10-CM

## 2016-12-06 DIAGNOSIS — M6281 Muscle weakness (generalized): Secondary | ICD-10-CM

## 2016-12-06 DIAGNOSIS — Z9181 History of falling: Secondary | ICD-10-CM

## 2016-12-06 DIAGNOSIS — I69354 Hemiplegia and hemiparesis following cerebral infarction affecting left non-dominant side: Secondary | ICD-10-CM

## 2016-12-06 DIAGNOSIS — R41841 Cognitive communication deficit: Secondary | ICD-10-CM

## 2016-12-06 DIAGNOSIS — R261 Paralytic gait: Secondary | ICD-10-CM

## 2016-12-06 NOTE — Therapy (Signed)
[facility] [address] [place], [place], [address] Phone: [phone]   Fax:  [phone]  Occupational Therapy Treatment  Patient Details  Name: Wendy Mcclain MRN: [id number] Date of Birth: 08-05-1950 Referring Provider: Dr Ronnie Doss, DO  Encounter Date: 12/06/2016      OT End of Session - 12/06/16 1027    Visit Number 5   Number of Visits 17   Date for OT Re-Evaluation 01/18/17   Authorization Type Medicare -    Authorization Time Period G code and progress note every 10th visit.   Authorization - Visit Number 5   Authorization - Number of Visits 10   OT Start Time [id number]   OT Stop Time 1015   OT Time Calculation (min) 42 min   Activity Tolerance Patient tolerated treatment well   Behavior During Therapy WFL for tasks assessed/performed      Past Medical History:  Diagnosis Date  . Arrhythmia   . Asthma   . Diabetes mellitus   . GERD (gastroesophageal reflux disease)     Past Surgical History:  Procedure Laterality Date  . ABDOMINAL HYSTERECTOMY  1993  . APPENDECTOMY  1963  . BREAST ENHANCEMENT SURGERY  1986    There were no vitals filed for this visit.      Subjective Assessment - 12/06/16 0935    Subjective  Pt reports left arm discomfort/ numbness   Pertinent History Chest pain with excertion; type 2 DM (insulin dependent); Allergic rhinitis; GERD; Arrhythmia. 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 No/denies            Treatment: placing grooved pegs in pegboard with LUE then removing with in hand manipulation, only occasional min difficulty. Scanning to to match clock faces with the correct time, pt used a system to organize and completed in a  reasonable amount of time. Environmental scanning, pt correctly located all items on the first pass for basic scanning in a busy environment. Functional overhead reaching in standing to retrieve items from overhead shelf and place on a low surface, no LOB, supervision provided. Arm bike x 6 mins level 3 for conditioning. Tabletop scanning with a cognitive component, large print, pt completed correctly in a resonable amount of time. She demonstrates improved visual perceptual skills today.                    OT Short Term Goals - 11/18/16 1225      OT SHORT TERM GOAL #1   Title Pt will be Mod I HEP for coordaination LUE (Target date: 12/16/16)   Time 4   Period Weeks   Status New     OT SHORT TERM GOAL #2   Title Pt will be Mod I strengthening HEP for LUE (Target date 12/16/16)   Time 4   Period Weeks   Status New     OT SHORT TERM GOAL #3   Title Pt will be supervision simple functional reach mid level using L UE as assist to dominant RUE w/o LOB during simulated ADL's   Time 4   Period Weeks   Status New           OT Long Term Goals - 11/18/16 1229      OT LONG  TERM GOAL #1   Title Pt wil ldemonstrate improved coordination as seen by improved 9 hole peg test score left of 28 seconds or less & Mod I fastening buttons/fasteners when dressing.   Baseline 11/18/16: L = 34.81 seconds, R = 24.28 seconds; Min A fastening buttons, tying shoes etc.   Time 8   Period Weeks   Status New     OT LONG TERM GOAL #2   Title Pt will demonstrate improved LUE grip strength as seen by JAMAR grip assessment L of 15# or greater   Baseline 11/18/16 L = 4# vs R = 29# via JAMAR grip position II   Time 8   Period Weeks   Status New     OT LONG TERM GOAL #3   Title Pt will be Mod I simple snack prep in ADL kitchen w/o LOB   Time 8   Period Weeks   Status New     OT LONG TERM GOAL #4   Title Pt will be supervision simple bill pay, check writing   Time 8   Period Weeks    Status New     OT LONG TERM GOAL #5   Title Pt will be supervision memory strategies and cognitive compensatory strategies during ADL tasks   Time 8   Period Weeks   Status New     Long Term Additional Goals   Additional Long Term Goals Yes     OT LONG TERM GOAL #6   Title Pt will be Mod I compensatory strategies due to visual changes during ADL/homemaking activities in ADL kitchen   Time 8   Period Weeks   Status New               Plan - 12/06/16 1006    Clinical Impression Statement Pt is progressing towards goals. She demonstrates improved environmental scanning today.   Rehab Potential Good   OT Frequency 2x / week   OT Duration 8 weeks   OT Treatment/Interventions Self-care/ADL training;Therapeutic exercise;Cognitive remediation/compensation;Neuromuscular education;Visual/perceptual remediation/compensation;Energy conservation;Therapist, nutritional;Therapeutic exercises;Patient/family education;DME and/or AE instruction;Therapeutic activities   Plan Progress HEP for LUE, pt has ball ex in supine   OT Home Exercise Plan issued coordination, ball ex supine   Consulted and Agree with Plan of Care Patient      Patient will benefit from skilled therapeutic intervention in order to improve the following deficits and impairments:  Decreased cognition, Decreased knowledge of use of DME, Pain, Decreased coordination, Decreased mobility, Impaired sensation, Decreased strength, Decreased endurance, Decreased activity tolerance, Decreased range of motion, Decreased balance, Decreased knowledge of precautions, Decreased safety awareness, Impaired perceived functional ability  Visit Diagnosis: Muscle weakness (generalized)  Hemiplegia and hemiparesis following cerebral infarction affecting left non-dominant side (HCC)  Other lack of coordination  Visuospatial deficit  Unsteadiness on feet    Problem List Patient Active Problem List   Diagnosis Date Noted  . Left  leg weakness   . Cerebrovascular accident (CVA) ([place])   . Hyperlipidemia   . OSA (obstructive sleep apnea)   . Left-sided weakness 10/11/2016  . Orthostatic hypotension 09/18/2015  . Chest pain 08/15/2015  . Acute sinusitis 08/19/2014  . Yeast infection 08/19/2014  . Vitamin D deficiency 05/20/2014  . Gastro-esophageal reflux 03/20/2014  . Allergic rhinitis 07/09/2013  . Sleep disorder 07/09/2013  . Elevated blood pressure (not hypertension) 07/09/2013  . Mood disorder ([place]) 11/23/2011  . Diabetes mellitus ([place]) 11/23/2011  . Health care maintenance 11/23/2011    Nakoa Ganus 12/06/2016, 10:27  AM  [facility] [address] [place], [place], [address] Phone: [phone]   Fax:  [phone]  Name: Wendy Mcclain MRN: [id number] Date of Birth: 21-May-1951

## 2016-12-06 NOTE — Therapy (Signed)
[facility] [address] [place], [place], [address] Phone: [phone]   Fax:  [phone]  Physical Therapy Treatment  Patient Details  Name: Wendy Mcclain MRN: [id number] Date of Birth: 1950/12/06 Referring Provider: Ronnie Doss DO  Talbert Cage MD)  Encounter Date: 12/06/2016      PT End of Session - 12/06/16 0954    Visit Number 7   Number of Visits 17   Date for PT Re-Evaluation 01/14/17   Authorization Type Medicare & G-codes    PT Start Time 0849   PT Stop Time 0930   PT Time Calculation (min) 41 min   Equipment Utilized During Treatment Gait belt   Activity Tolerance Patient tolerated treatment well   Behavior During Therapy [facility] for tasks assessed/performed      Past Medical History:  Diagnosis Date  . Arrhythmia   . Asthma   . Diabetes mellitus   . GERD (gastroesophageal reflux disease)     Past Surgical History:  Procedure Laterality Date  . ABDOMINAL HYSTERECTOMY  1993  . APPENDECTOMY  1963  . BREAST ENHANCEMENT SURGERY  1986    There were no vitals filed for this visit.      Subjective Assessment - 12/06/16 0852    Subjective Patient denies any changes, complaints or falls since last visit. Patient reports occasional, minimal numbness in upper LUE.    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 No/denies                         [facility] Adult PT Treatment/Exercise - 12/06/16 0845      Transfers   Transfers Sit to Stand;Stand to Sit   Sit to Stand 6: Modified independent (Device/Increase time);With upper extremity assist;Without upper extremity  assist;From bed;From chair/3-in-1   Stand to Sit 6: Modified independent (Device/Increase time);Without upper extremity assist;With upper extremity assist;To bed;To chair/3-in-1     Ambulation/Gait   Ambulation/Gait Yes   Ambulation/Gait Assistance 5: Supervision   Ambulation/Gait Assistance Details Patient ambulated over mulch/gravel/grass required min guard for safety. Patient ambulated with min guard for safety over grass. PT worked with patient on obstacle negotiation, and patient requires demonstration and cueing for technique.   Ambulation Distance (Feet) 600 Feet  multiple surfaces and lap around building in grass    Assistive device None   Gait Pattern Step-through pattern;Decreased arm swing - left;Narrow base of support;Decreased trunk rotation   Ambulation Surface Level;Unlevel;Indoor;Outdoor;Paved;Gravel;Grass;Other (comment)  mulch     Neuro Re-ed    Neuro Re-ed Details  In hallway with min guard from PT: forward ambulation + head turns/nods/diagonals, backwards ambulation, fast/slow pace, side stepping, and braiding all requiring demonstration and cueing for technique. Performed forward/backwards marching on gravel, forward/backward marching on gravel with hand taps on opposite knee (trunk rotation), side stepping and braiding. Progressed to performing those activities on gravel, over transition gravel to grass, and over grass. Patient requires constant min guard with occasional min A in transitions for safety.                  PT Short Term Goals - 11/16/16 1330      PT SHORT  TERM GOAL #1   Title Patient will verbalize understanding of initial HEP to decrease her risk of falling. (TARGET DATE: 12/17/2016)   Time 4   Period Weeks   Status New     PT SHORT TERM GOAL #2   Title Patient will achieve a cognitive TUG score of < or = 18 seconds in order to decrease her risk of falling. (TARGET DATE: 12/17/2016)   Time 4   Period Weeks   Status New     PT SHORT TERM  GOAL #3   Title Patient will demonstrate an ability to ambulate 500 feet with supervision and LRAD to indicate a decrease in her risk of falling. (TARGET DATE: 12/17/2016)   Time 4   Period Weeks   Status New           PT Long Term Goals - 11/16/16 1334      PT LONG TERM GOAL #1   Title Patient will verbalize understanidng of HEP and ongoing fitness program. (TARGET DATE: 01/14/2017)    Time 8   Period Weeks   Status New     PT LONG TERM GOAL #2   Title Patient will achieve a score of >or = 45 to demonstrate a decrease in her risk of falling. (TARGET DATE: 01/14/2017)   Time 8   Period Weeks   Status New     PT LONG TERM GOAL #3   Title Patient will demonstrate ability to ambulate 1000 feet without AD including unlevel surfaces (grass, curbs, ramps) with supervision to indicate a return to patient's prior level of function. (TARGET DATE: 01/14/2017)   Time 8   Period Weeks   Status New     PT LONG TERM GOAL #4   Title Patient will acheive a functional gait assessment score of > or = 19 to indicate a decrease in her risk of falling. (TARGET DATE: 01/14/2017)   Time 8   Period Weeks   Status New     PT LONG TERM GOAL #5   Title Patient will achieve a cognitive TUG score of < or = 15.5 seconds to indicate a decrease in her risk of falling. (TARGET DATE: 01/14/2017)   Time 8   Period Weeks   Status New               Plan - 12/06/16 0954    Clinical Impression Statement Today's skilled PT session focused on advancing ambulation over unlevel surfaces. Patient required a seated rest break after approximately 30 minutes of ambulation due to feeling fatigued. Patient is progressing towards goals, and will benefit from continued skilled PT to address deficits.   Rehab Potential Good   Clinical Impairments Affecting Rehab Potential chest pain with exertion, type 2 DM, allergic rhinitis, GERD, arrhythmia    PT Frequency 2x / week   PT Duration 8 weeks   PT  Treatment/Interventions ADLs/Self Care Home Management;Electrical Stimulation;DME Instruction;Gait training;Stair training;Functional mobility training;Therapeutic activities;Therapeutic exercise;Balance training;Patient/family education;Neuromuscular re-education;Manual techniques   PT Next Visit Plan advance compliant surface gait and balance activities with focus on SLS and weight shift onto LLE    Consulted and Agree with Plan of Care Patient      Patient will benefit from skilled therapeutic intervention in order to improve the following deficits and impairments:  Abnormal gait, Decreased activity tolerance, Decreased balance, Decreased knowledge of precautions, Decreased endurance, Decreased knowledge of use of DME, Decreased mobility, Decreased range of motion, Decreased safety awareness, Difficulty walking, Decreased strength, Impaired sensation, Impaired  tone, Postural dysfunction, Pain  Visit Diagnosis: Muscle weakness (generalized)  Other lack of coordination  Unsteadiness on feet  History of falling  Paralytic gait  Other abnormalities of gait and mobility     Problem List Patient Active Problem List   Diagnosis Date Noted  . Left leg weakness   . Cerebrovascular accident (CVA) ([place])   . Hyperlipidemia   . OSA (obstructive sleep apnea)   . Left-sided weakness 10/11/2016  . Orthostatic hypotension 09/18/2015  . Chest pain 08/15/2015  . Acute sinusitis 08/19/2014  . Yeast infection 08/19/2014  . Vitamin D deficiency 05/20/2014  . Gastro-esophageal reflux 03/20/2014  . Allergic rhinitis 07/09/2013  . Sleep disorder 07/09/2013  . Elevated blood pressure (not hypertension) 07/09/2013  . Mood disorder ([place]) 11/23/2011  . Diabetes mellitus ([place]) 11/23/2011  . Health care maintenance 11/23/2011    [place], SPT  12/06/2016, 9:58 AM  [facility] [address] [place], [place], [address] Phone:  [phone]   Fax:  [phone]  Name: Wendy Mcclain MRN: [id number] Date of Birth: 31-Dec-1950

## 2016-12-06 NOTE — Therapy (Signed)
[facility] [address] [place], [place], [address] Phone: [phone]   Fax:  [phone]  Speech Language Pathology Treatment  Patient Details  Name: Wendy Mcclain MRN: [id number] Date of Birth: [birth year] Referring Provider: Janora Norlander, DO  Encounter Date: 12/06/2016      End of Session - 12/06/16 1249    Visit Number 6   Number of Visits 17   Date for SLP Re-Evaluation 01/14/17   SLP Start Time 0808   SLP Stop Time  0846   SLP Time Calculation (min) 38 min   Activity Tolerance Patient tolerated treatment well      Past Medical History:  Diagnosis Date  . Arrhythmia   . Asthma   . Diabetes mellitus   . GERD (gastroesophageal reflux disease)     Past Surgical History:  Procedure Laterality Date  . ABDOMINAL HYSTERECTOMY  1993  . APPENDECTOMY  1963  . BREAST ENHANCEMENT SURGERY  1986    There were no vitals filed for this visit.      Subjective Assessment - 12/06/16 0810    Subjective Pt arrived 8 min late for her appointment, with planner/notebook.   Currently in Pain? No/denies               ADULT SLP TREATMENT - 12/06/16 0811      General Information   Behavior/Cognition Alert;Pleasant mood;Cooperative   Patient Positioning Upright in chair   Oral care provided N/A     Treatment Provided   Treatment provided Cognitive-Linquistic     Pain Assessment   Pain Assessment No/denies pain     Cognitive-Linquistic Treatment   Treatment focused on Cognition   Skilled Treatment Pt arrived with newly purchased calendar; had independently recorded dates for the coming week and put in her upcoming therapy appointments. SLP facilitated pt awareness of need to write specific information (reminders to complete specific tasks for therapy vs. "work"), as well as record the days of the week in addition to dates. Pt began this task, with interruptions required min A to return to the initial  task. Targeted alternating attention; pt corrected hw assignment with rare min A to navigate, tracked answers independently. SLP facilitated simple reasoning during review of home tasks by educating pt on using a checklist. Pt required consistent min A for first task, SLP faded support as pt completed second task with rare min A.     Assessment / Recommendations / Plan   Plan Continue with current plan of care     Progression Toward Goals   Progression toward goals Progressing toward goals          SLP Education - 12/06/16 1247    Education provided Yes   Education Details creating/utilizing a checklist to break down larger tasks into individual steps   Person(s) Educated Patient   Methods Explanation   Comprehension Verbalized understanding;Returned demonstration          SLP Short Term Goals - 12/06/16 1257      SLP SHORT TERM GOAL #1   Title Pt will attend to details in mildly complex cognitive linguistic tasks with 85% accuracy and occasional min A   Time 2   Period Weeks   Status On-going     SLP SHORT TERM GOAL #2   Title Pt will complete various naming tasks of increasing complexity with 90% success over 2 sessions, given mod assist   Time 2   Period Weeks   Status On-going  SLP SHORT TERM GOAL #3   Title Pt will implement and use a system for functional recall of daily and scheduled activities with mod assistance over 2 sessions   Time 2   Period Weeks   Status On-going          SLP Long Term Goals - 12/06/16 1258      SLP LONG TERM GOAL #1   Title Pt will alternate attention between 2 mildly complex cognitive linguistic tasks with 85% on each and occasional min A   Time 6   Period Weeks   Status On-going     SLP LONG TERM GOAL #2   Title Pt will complete Level III (abstract) naming tasks with 90% accuracy without cues   Time 6   Period Weeks   Status On-going     SLP LONG TERM GOAL #3   Title Pt will report daily functional and effective use  of compensatory strategies for recall of daily activities and appointments.    Time 6   Period Weeks   Status On-going     SLP LONG TERM GOAL #4   Title pt will tell SLP 4 memory strategies over two sessions with modified independence   Time 6   Period Weeks   Status On-going          Plan - 12/06/16 1249    Clinical Impression Statement Deficits in attention and memory were again seen today in therapy. Error awareness cont'd as suboptimal. After training and instruction in recording specific details in her planner, pt was observed to carry over this task to record information about her therapy homework independently. Skilled ST needs to cont to address attention, memory and wordfinding deficits.   Speech Therapy Frequency 2x / week   Duration Other (comment)   Treatment/Interventions Language facilitation;Environmental controls;Cueing hierarchy;SLP instruction and feedback;Compensatory techniques;Cognitive reorganization;Functional tasks;Multimodal communcation approach;Patient/family education;Internal/external aids   Potential to Achieve Goals Good   SLP Home Exercise Plan continue using calendar, add dates and appointments for upcoming month   Consulted and Agree with Plan of Care Patient      Patient will benefit from skilled therapeutic intervention in order to improve the following deficits and impairments:   Cognitive communication deficit    Problem List Patient Active Problem List   Diagnosis Date Noted  . Left leg weakness   . Cerebrovascular accident (CVA) ([place])   . Hyperlipidemia   . OSA (obstructive sleep apnea)   . Left-sided weakness 10/11/2016  . Orthostatic hypotension 09/18/2015  . Chest pain 08/15/2015  . Acute sinusitis 08/19/2014  . Yeast infection 08/19/2014  . Vitamin D deficiency 05/20/2014  . Gastro-esophageal reflux 03/20/2014  . Allergic rhinitis 07/09/2013  . Sleep disorder 07/09/2013  . Elevated blood pressure (not hypertension) 07/09/2013   . Mood disorder ([place]) 11/23/2011  . Diabetes mellitus ([place]) 11/23/2011  . Health care maintenance 11/23/2011   Deneise Lever, [place], Antoine 12/06/2016, 12:59 PM  [facility] [address] [place], [place], [address] Phone: [phone]   Fax:  [phone]   Name: Wendy Mcclain MRN: [id number] Date of Birth: 10-21-1950

## 2016-12-09 ENCOUNTER — Encounter: Payer: Self-pay | Admitting: Occupational Therapy

## 2016-12-09 ENCOUNTER — Encounter: Payer: Self-pay | Admitting: Physical Therapy

## 2016-12-09 ENCOUNTER — Ambulatory Visit: Payer: Medicare Other | Admitting: Occupational Therapy

## 2016-12-09 ENCOUNTER — Ambulatory Visit: Payer: Medicare Other | Admitting: Physical Therapy

## 2016-12-09 ENCOUNTER — Ambulatory Visit: Payer: Medicare Other | Admitting: Speech Pathology

## 2016-12-09 DIAGNOSIS — R41841 Cognitive communication deficit: Secondary | ICD-10-CM

## 2016-12-09 DIAGNOSIS — R2681 Unsteadiness on feet: Secondary | ICD-10-CM

## 2016-12-09 DIAGNOSIS — R41842 Visuospatial deficit: Secondary | ICD-10-CM

## 2016-12-09 DIAGNOSIS — I69318 Other symptoms and signs involving cognitive functions following cerebral infarction: Secondary | ICD-10-CM

## 2016-12-09 DIAGNOSIS — I69354 Hemiplegia and hemiparesis following cerebral infarction affecting left non-dominant side: Secondary | ICD-10-CM

## 2016-12-09 DIAGNOSIS — Z9181 History of falling: Secondary | ICD-10-CM

## 2016-12-09 DIAGNOSIS — R2689 Other abnormalities of gait and mobility: Secondary | ICD-10-CM | POA: Diagnosis not present

## 2016-12-09 DIAGNOSIS — R278 Other lack of coordination: Secondary | ICD-10-CM

## 2016-12-09 DIAGNOSIS — M6281 Muscle weakness (generalized): Secondary | ICD-10-CM | POA: Diagnosis not present

## 2016-12-09 NOTE — Therapy (Signed)
[facility] [address] [place], [place], [address] Phone: [phone]   Fax:  [phone]  Speech Language Pathology Treatment  Patient Details  Name: Wendy Mcclain MRN: [id number] Date of Birth: 06-23-1951 Referring Provider: Janora Norlander, DO  Encounter Date: 12/09/2016      End of Session - 12/09/16 1130    Visit Number 7   Number of Visits 17   Date for SLP Re-Evaluation 01/14/17   SLP Start Time 0847   SLP Stop Time  0930   SLP Time Calculation (min) 43 min   Activity Tolerance Patient tolerated treatment well      Past Medical History:  Diagnosis Date  . Arrhythmia   . Asthma   . Diabetes mellitus   . GERD (gastroesophageal reflux disease)     Past Surgical History:  Procedure Laterality Date  . ABDOMINAL HYSTERECTOMY  1993  . APPENDECTOMY  1963  . BREAST ENHANCEMENT SURGERY  1986    There were no vitals filed for this visit.      Subjective Assessment - 12/09/16 0850    Subjective "I didn't probably get as much done as I should have."   Currently in Pain? Yes   Pain Score 1    Pain Location Shoulder   Pain Orientation Left   Pain Descriptors / Indicators Aching;Numbness   Pain Type Chronic pain   Pain Onset More than a month ago   Pain Frequency Intermittent   Aggravating Factors  sleeping position   Pain Relieving Factors occupational therapy helped, stretching   Effect of Pain on Daily Activities limits activities   Multiple Pain Sites No               ADULT SLP TREATMENT - 12/09/16 0852      General Information   Behavior/Cognition Alert;Pleasant mood;Cooperative   Patient Positioning Upright in chair   Oral care provided N/A     Treatment Provided   Treatment provided Cognitive-Linquistic     Cognitive-Linquistic Treatment   Treatment focused on Cognition   Skilled Treatment SLP reviewed home exercises; pt required rare min A for error awareness, attention  to detail, 90% accuracy for reasoning tasks. Utilizing planner, pt recalled home assignment, which was partially completed. Pt identified upcoming events and with rare min A utilized her planner to organize steps for an upcoming [place] trip to the mountains. Home task assigned for planner use to record daily to do lists, bring to next session.     Assessment / Recommendations / Plan   Plan Continue with current plan of care     Progression Toward Goals   Progression toward goals Progressing toward goals          SLP Education - 12/09/16 1129    Education provided Yes   Education Details Use of planner to assist with recall and organizing of information   Person(s) Educated Patient   Methods Explanation   Comprehension Verbalized understanding;Returned demonstration;Need further instruction          SLP Short Term Goals - 12/09/16 1133      SLP SHORT TERM GOAL #1   Title Pt will attend to details in mildly complex cognitive linguistic tasks with 85% accuracy and occasional min A   Time 2   Period Weeks   Status On-going     SLP SHORT TERM GOAL #2   Title Pt will complete various naming tasks of increasing complexity with 90% success over 2 sessions, given mod  assist   Time 2   Period Weeks   Status On-going     SLP SHORT TERM GOAL #3   Title Pt will implement and use a system for functional recall of daily and scheduled activities with mod assistance over 2 sessions   Time 2   Period Weeks   Status On-going          SLP Long Term Goals - 12/09/16 1134      SLP LONG TERM GOAL #1   Title Pt will alternate attention between 2 mildly complex cognitive linguistic tasks with 85% on each and occasional min A   Time 6   Period Weeks   Status On-going     SLP LONG TERM GOAL #2   Title Pt will complete Level III (abstract) naming tasks with 90% accuracy without cues   Time 6   Period Weeks     SLP LONG TERM GOAL #3   Title Pt will report daily functional and  effective use of compensatory strategies for recall of daily activities and appointments.    Time 6   Period Weeks   Status On-going     SLP LONG TERM GOAL #4   Title pt will tell SLP 4 memory strategies over two sessions with modified independence   Time 6   Period Weeks   Status On-going          Plan - 12/09/16 1130    Clinical Impression Statement Pt continues to display deficits in memory and attention. Error awareness somewhat improved this session, and pt is making clear efforts to use systematic approach and trained therapy strategies in reasoning tasks. Continues to require assistance in identifying appropriate items to record in her planner and verbal cues to attend to /record specific details. Skilled ST needs to cont to address attention, memory and wordfinding deficits.   Speech Therapy Frequency 2x / week   Duration Other (comment)   Treatment/Interventions Language facilitation;Environmental controls;Cueing hierarchy;SLP instruction and feedback;Compensatory techniques;Cognitive reorganization;Functional tasks;Multimodal communcation approach;Patient/family education;Internal/external aids   Potential to Achieve Goals Good   SLP Home Exercise Plan record daily to do list in her planner   Consulted and Agree with Plan of Care Patient      Patient will benefit from skilled therapeutic intervention in order to improve the following deficits and impairments:   Cognitive communication deficit    Problem List Patient Active Problem List   Diagnosis Date Noted  . Left leg weakness   . Cerebrovascular accident (CVA) ([place])   . Hyperlipidemia   . OSA (obstructive sleep apnea)   . Left-sided weakness 10/11/2016  . Orthostatic hypotension 09/18/2015  . Chest pain 08/15/2015  . Acute sinusitis 08/19/2014  . Yeast infection 08/19/2014  . Vitamin D deficiency 05/20/2014  . Gastro-esophageal reflux 03/20/2014  . Allergic rhinitis 07/09/2013  . Sleep disorder 07/09/2013   . Elevated blood pressure (not hypertension) 07/09/2013  . Mood disorder ([place]) 11/23/2011  . Diabetes mellitus ([place]) 11/23/2011  . Health care maintenance 11/23/2011   Deneise Lever, [place], [place] 12/09/2016, 11:35 AM  [facility] [address] [place], [place], [address] Phone: [phone]   Fax:  [phone]   Name: Wendy Mcclain MRN: [id number] Date of Birth: 1950/11/21

## 2016-12-09 NOTE — Therapy (Signed)
[facility] [address] [place], [place], [address] Phone: [phone]   Fax:  [phone]  Physical Therapy Treatment  Patient Details  Name: Wendy Mcclain MRN: [id number] Date of Birth: March 08, 1951 Referring Provider: Ronnie Doss DO  Talbert Cage MD)  Encounter Date: 12/09/2016      PT End of Session - 12/09/16 1025    Visit Number 8   Number of Visits 17   Date for PT Re-Evaluation 01/14/17   Authorization Type Medicare & G-codes    PT Start Time 0935   PT Stop Time 1015   PT Time Calculation (min) 40 min   Equipment Utilized During Treatment Gait belt   Activity Tolerance Patient tolerated treatment well   Behavior During Therapy [facility] for tasks assessed/performed      Past Medical History:  Diagnosis Date  . Arrhythmia   . Asthma   . Diabetes mellitus   . GERD (gastroesophageal reflux disease)     Past Surgical History:  Procedure Laterality Date  . ABDOMINAL HYSTERECTOMY  1993  . APPENDECTOMY  1963  . BREAST ENHANCEMENT SURGERY  1986    There were no vitals filed for this visit.      Subjective Assessment - 12/09/16 0935    Subjective Pt still feels imbalanced in the community but no falls. Pt is walking for exercise 3x/week for about 20 min.   Currently in Pain? No/denies   Pain Onset More than a month ago                              Balance Exercises - 12/09/16 0942      Balance Exercises: Standing   Standing Eyes Opened Wide (BOA);Narrow base of support (BOS);Head turns;Foam/compliant surface  progressing to narrow BOS; intermittent UE support.   SLS with Vectors Foam/compliant surface  multidirectional tapping cones progressing with head turnss   Stepping Strategy Anterior;Posterior;Foam/compliant surface;Lateral  progressing with head turns   Tandem Gait Forward;Intermittent upper extremity support;Foam/compliant surface   Sidestepping  Foam/compliant support;Head turns           PT Education - 12/09/16 0940    Education provided Yes   Education Details Explained fluctuations in fatigue/energy level when doing "new" activities in the community and to be aware of possible balance issues and safety.   Person(s) Educated Patient   Methods Explanation   Comprehension Verbalized understanding          PT Short Term Goals - 11/16/16 1330      PT SHORT TERM GOAL #1   Title Patient will verbalize understanding of initial HEP to decrease her risk of falling. (TARGET DATE: 12/17/2016)   Time 4   Period Weeks   Status New     PT SHORT TERM GOAL #2   Title Patient will achieve a cognitive TUG score of < or = 18 seconds in order to decrease her risk of falling. (TARGET DATE: 12/17/2016)   Time 4   Period Weeks   Status New     PT SHORT TERM GOAL #3   Title Patient will demonstrate an ability to ambulate 500 feet with supervision and LRAD to indicate a decrease in her risk of falling. (TARGET DATE: 12/17/2016)   Time 4   Period Weeks   Status New           PT Long Term Goals - 11/16/16 1334      PT LONG TERM  GOAL #1   Title Patient will verbalize understanidng of HEP and ongoing fitness program. (TARGET DATE: 01/14/2017)    Time 8   Period Weeks   Status New     PT LONG TERM GOAL #2   Title Patient will achieve a score of >or = 45 to demonstrate a decrease in her risk of falling. (TARGET DATE: 01/14/2017)   Time 8   Period Weeks   Status New     PT LONG TERM GOAL #3   Title Patient will demonstrate ability to ambulate 1000 feet without AD including unlevel surfaces (grass, curbs, ramps) with supervision to indicate a return to patient's prior level of function. (TARGET DATE: 01/14/2017)   Time 8   Period Weeks   Status New     PT LONG TERM GOAL #4   Title Patient will acheive a functional gait assessment score of > or = 19 to indicate a decrease in her risk of falling. (TARGET DATE: 01/14/2017)   Time 8    Period Weeks   Status New     PT LONG TERM GOAL #5   Title Patient will achieve a cognitive TUG score of < or = 15.5 seconds to indicate a decrease in her risk of falling. (TARGET DATE: 01/14/2017)   Time 8   Period Weeks   Status New               Plan - 12/09/16 1031    Clinical Impression Statement Balance training focuses on compliant surface training progressing with narrow BOS and SLS, with head turns and stepping strategies. Pt demonstrated that she was most challenged with tandem gait on compliant balance beam.  Pt demonstrated ability to perforn SLS stance and stepping strategies on compliant surface without UE support.                                                           Rehab Potential Good   Clinical Impairments Affecting Rehab Potential chest pain with exertion, type 2 DM, allergic rhinitis, GERD, arrhythmia    PT Frequency 2x / week   PT Duration 8 weeks   PT Treatment/Interventions ADLs/Self Care Home Management;Electrical Stimulation;DME Instruction;Gait training;Stair training;Functional mobility training;Therapeutic activities;Therapeutic exercise;Balance training;Patient/family education;Neuromuscular re-education;Manual techniques   PT Next Visit Plan advance compliant surface gait and balance activities with focus on SLS and weight shift onto LLE    Consulted and Agree with Plan of Care Patient      Patient will benefit from skilled therapeutic intervention in order to improve the following deficits and impairments:  Abnormal gait, Decreased activity tolerance, Decreased balance, Decreased knowledge of precautions, Decreased endurance, Decreased knowledge of use of DME, Decreased mobility, Decreased range of motion, Decreased safety awareness, Difficulty walking, Decreased strength, Impaired sensation, Impaired tone, Postural dysfunction, Pain  Visit Diagnosis: Other abnormalities of gait and mobility  Unsteadiness on feet  History of  falling     Problem List Patient Active Problem List   Diagnosis Date Noted  . Left leg weakness   . Cerebrovascular accident (CVA) ([place])   . Hyperlipidemia   . OSA (obstructive sleep apnea)   . Left-sided weakness 10/11/2016  . Orthostatic hypotension 09/18/2015  . Chest pain 08/15/2015  . Acute sinusitis 08/19/2014  . Yeast infection 08/19/2014  . Vitamin D deficiency 05/20/2014  .  Gastro-esophageal reflux 03/20/2014  . Allergic rhinitis 07/09/2013  . Sleep disorder 07/09/2013  . Elevated blood pressure (not hypertension) 07/09/2013  . Mood disorder ([place]) 11/23/2011  . Diabetes mellitus ([place]) 11/23/2011  . Health care maintenance 11/23/2011   Bjorn Loser, PTA  12/09/16, 10:36 AM [facility] [address] [place], [place], [address] Phone: [phone]   Fax:  [phone]  Name: Wendy Mcclain MRN: [id number] Date of Birth: Jun 15, 1951

## 2016-12-09 NOTE — Therapy (Signed)
[facility] [address] [place], [place], [address] Phone: [phone]   Fax:  [phone]  Occupational Therapy Treatment  Patient Details  Name: Wendy Mcclain MRN: [id number] Date of Birth: Feb 05, 1951 Referring Provider: Dr Ronnie Doss, DO  Encounter Date: 12/09/2016      OT End of Session - 12/09/16 1030    Visit Number 6   Number of Visits 17   Date for OT Re-Evaluation 01/18/17   Authorization Type Medicare -    Authorization Time Period G code and progress note every 10th visit.   Authorization - Visit Number 6   Authorization - Number of Visits 10   OT Start Time 0802   OT Stop Time [id number]   OT Time Calculation (min) 41 min   Activity Tolerance Patient tolerated treatment well      Past Medical History:  Diagnosis Date  . Arrhythmia   . Asthma   . Diabetes mellitus   . GERD (gastroesophageal reflux disease)     Past Surgical History:  Procedure Laterality Date  . ABDOMINAL HYSTERECTOMY  1993  . APPENDECTOMY  1963  . BREAST ENHANCEMENT SURGERY  1986    There were no vitals filed for this visit.      Subjective Assessment - 12/09/16 0801    Subjective  That hurts ( raising her L arm above 100*of shoulder flexion)   Pertinent History Chest pain with excertion; type 2 DM (insulin dependent); Allergic rhinitis; GERD; Arrhythmia. 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 Yes   Pain Score 4   shoulder flexion/abduction   Pain Location Shoulder   Pain Orientation Left   Pain Descriptors / Indicators Aching;Sore;Sharp   Pain Type Chronic pain   Pain Onset More than a month ago   Pain Frequency Intermittent   Aggravating Factors  certain movements   Pain Relieving  Factors avoiding those movemennts                      OT Treatments/Exercises (OP) - 12/09/16 0001      Neurological Re-education Exercises   Other Exercises 2 Following manual therapy addressed mid to overhead reach in sitting in closed chain with mod facilitation to depress and upwardly rotate scap.  Also addressed proximal strengthening for posterior muscles with resistance and holding.  Progressed to open chain reaching overhead with functional task and cues to avoid shifting to R side and using trunk and shoulder elevation to raise arm. Pt pain free at end of session with overhead reach.      Manual Therapy   Manual Therapy Joint mobilization;Soft tissue mobilization;Scapular mobilization   Manual therapy comments joint, soft tissue, and scap mob to realign L shoulder girdle as pt complained of pain with shoulder flexion/abduction greater than 90* in supine and sitting (4/10).  By end of session pt could achieve 150** of flexion in sitting with 0/10 pain                OT Short Term Goals - 12/09/16 1029      OT SHORT TERM GOAL #1   Title Pt will be Mod I HEP for coordaination LUE (Target date: 12/16/16)   Time 4   Period Weeks   Status New  OT SHORT TERM GOAL #2   Title Pt will be Mod I strengthening HEP for LUE (Target date 12/16/16)   Time 4   Period Weeks   Status New     OT SHORT TERM GOAL #3   Title Pt will be supervision simple functional reach mid level using L UE as assist to dominant RUE w/o LOB during simulated ADL's   Time 4   Period Weeks   Status New           OT Long Term Goals - 12/09/16 1029      OT LONG TERM GOAL #1   Title Pt wil ldemonstrate improved coordination as seen by improved 9 hole peg test score left of 28 seconds or less & Mod I fastening buttons/fasteners when dressing.   Baseline 11/18/16: L = 34.81 seconds, R = 24.28 seconds; Min A fastening buttons, tying shoes etc.   Time 8   Period Weeks   Status New      OT LONG TERM GOAL #2   Title Pt will demonstrate improved LUE grip strength as seen by JAMAR grip assessment L of 15# or greater   Baseline 11/18/16 L = 4# vs R = 29# via JAMAR grip position II   Time 8   Period Weeks   Status New     OT LONG TERM GOAL #3   Title Pt will be Mod I simple snack prep in ADL kitchen w/o LOB   Time 8   Period Weeks   Status New     OT LONG TERM GOAL #4   Title Pt will be supervision simple bill pay, check writing   Time 8   Period Weeks   Status New     OT LONG TERM GOAL #5   Title Pt will be supervision memory strategies and cognitive compensatory strategies during ADL tasks   Time 8   Period Weeks   Status New     OT LONG TERM GOAL #6   Title Pt will be Mod I compensatory strategies due to visual changes during ADL/homemaking activities in ADL kitchen   Time 8   Period Weeks   Status New               Plan - 12/09/16 1029    Clinical Impression Statement Pt progressing toward goals and improvin in overhead reach with LUE.    Rehab Potential Good   OT Frequency 2x / week   OT Duration 8 weeks   OT Treatment/Interventions Self-care/ADL training;Therapeutic exercise;Cognitive remediation/compensation;Neuromuscular education;Visual/perceptual remediation/compensation;Energy conservation;Therapist, nutritional;Therapeutic exercises;Patient/family education;DME and/or AE instruction;Therapeutic activities   Plan Progress HEP for LUE if pain allows ( check pain), NMR for overhead reach, functional activities and incorporate cognitive and visual goals into tasks.    Consulted and Agree with Plan of Care Patient      Patient will benefit from skilled therapeutic intervention in order to improve the following deficits and impairments:  Decreased cognition, Decreased knowledge of use of DME, Pain, Decreased coordination, Decreased mobility, Impaired sensation, Decreased strength, Decreased endurance, Decreased activity tolerance, Decreased  range of motion, Decreased balance, Decreased knowledge of precautions, Decreased safety awareness, Impaired perceived functional ability  Visit Diagnosis: Muscle weakness (generalized)  Other lack of coordination  Unsteadiness on feet  Hemiplegia and hemiparesis following cerebral infarction affecting left non-dominant side (HCC)  Visuospatial deficit  Other symptoms and signs involving cognitive functions following cerebral infarction    Problem List Patient Active Problem List   Diagnosis Date  Noted  . Left leg weakness   . Cerebrovascular accident (CVA) ([place])   . Hyperlipidemia   . OSA (obstructive sleep apnea)   . Left-sided weakness 10/11/2016  . Orthostatic hypotension 09/18/2015  . Chest pain 08/15/2015  . Acute sinusitis 08/19/2014  . Yeast infection 08/19/2014  . Vitamin D deficiency 05/20/2014  . Gastro-esophageal reflux 03/20/2014  . Allergic rhinitis 07/09/2013  . Sleep disorder 07/09/2013  . Elevated blood pressure (not hypertension) 07/09/2013  . Mood disorder ([place]) 11/23/2011  . Diabetes mellitus ([place]) 11/23/2011  . Health care maintenance 11/23/2011    Quay Burow, OTR/L 12/09/2016, 10:32 AM  [facility] [address] [place], [place], [address] Phone: [phone]   Fax:  [phone]  Name: Wendy Mcclain MRN: [id number] Date of Birth: Mar 01, 1951

## 2016-12-14 ENCOUNTER — Encounter: Payer: Self-pay | Admitting: Physical Therapy

## 2016-12-14 ENCOUNTER — Ambulatory Visit: Payer: Medicare Other

## 2016-12-14 ENCOUNTER — Ambulatory Visit: Payer: Medicare Other | Admitting: Occupational Therapy

## 2016-12-14 ENCOUNTER — Ambulatory Visit: Payer: Medicare Other | Admitting: Physical Therapy

## 2016-12-14 DIAGNOSIS — R41842 Visuospatial deficit: Secondary | ICD-10-CM

## 2016-12-14 DIAGNOSIS — R2681 Unsteadiness on feet: Secondary | ICD-10-CM

## 2016-12-14 DIAGNOSIS — R278 Other lack of coordination: Secondary | ICD-10-CM

## 2016-12-14 DIAGNOSIS — I69354 Hemiplegia and hemiparesis following cerebral infarction affecting left non-dominant side: Secondary | ICD-10-CM

## 2016-12-14 DIAGNOSIS — R2689 Other abnormalities of gait and mobility: Secondary | ICD-10-CM

## 2016-12-14 DIAGNOSIS — M6281 Muscle weakness (generalized): Secondary | ICD-10-CM | POA: Diagnosis not present

## 2016-12-14 DIAGNOSIS — R41841 Cognitive communication deficit: Secondary | ICD-10-CM

## 2016-12-14 DIAGNOSIS — Z9181 History of falling: Secondary | ICD-10-CM

## 2016-12-14 NOTE — Therapy (Signed)
[facility] [address] [place], [place], [address] Phone: [phone]   Fax:  [phone]  Physical Therapy Treatment  Patient Details  Name: Wendy Mcclain MRN: [id number] Date of Birth: 1951/06/21 Referring Provider: Ronnie Doss DO  Talbert Cage MD)  Encounter Date: 12/14/2016      PT End of Session - 12/14/16 1600    Visit Number 9   Number of Visits 17   Date for PT Re-Evaluation 01/14/17   Authorization Type Medicare & G-codes    PT Start Time 1400   PT Stop Time 1445   PT Time Calculation (min) 45 min   Equipment Utilized During Treatment Gait belt   Activity Tolerance Patient tolerated treatment well   Behavior During Therapy [facility] for tasks assessed/performed      Past Medical History:  Diagnosis Date  . Arrhythmia   . Asthma   . Diabetes mellitus   . GERD (gastroesophageal reflux disease)     Past Surgical History:  Procedure Laterality Date  . ABDOMINAL HYSTERECTOMY  1993  . APPENDECTOMY  1963  . BREAST ENHANCEMENT SURGERY  1986    There were no vitals filed for this visit.      Subjective Assessment - 12/14/16 1405    Subjective Patient denies any new changes, falls, or complaints since last visit. Patient reports she is walking on flat, paved surfaces in her neighborhood.    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 No/denies   Pain Onset More than a month ago            [facility] PT Assessment - 12/14/16 1400      Standardized Balance Assessment   Standardized Balance Assessment Timed Up and Go Test     Timed Up and Go Test   Normal TUG (seconds) 10.85    Cognitive TUG (seconds) 22.3                     [facility] Adult PT Treatment/Exercise - 12/14/16 1400      Transfers   Transfers Sit to Stand;Stand to Sit   Sit to Stand 6: Modified independent (Device/Increase time);With upper extremity assist;Without upper extremity assist;From bed;From chair/3-in-1   Stand to Sit 6: Modified independent (Device/Increase time);Without upper extremity assist;With upper extremity assist;To bed;To chair/3-in-1     Ambulation/Gait   Ambulation/Gait Yes   Ambulation/Gait Assistance 5: Supervision   Ambulation/Gait Assistance Details Patient ambulated approximately 300 feet while completing attention demanding tasks (talking/answering questions). Progressed to adding carrying a plate in L hand around treatment tables. Patient requires cueing to maintain speed while completing cognitive tasks. PT provided tactile cueing in order to maintain patient's gait velocity.   Ambulation Distance (Feet) 500 Feet  518f around building; 3043flevel/indoor+attention tasks   Assistive device None   Gait Pattern Step-through pattern;Decreased arm swing - left;Narrow base of support;Decreased trunk rotation   Ambulation Surface Level;Indoor;Outdoor;Paved   Door Management 5: Supervision   Door Managment Details (indicate cue type and reason) requires cueing to extend L elbow and hold this position long enough to walk through when opening door   Ramp 5: Supervision   Curb 5: Supervision   Gait Comments  Patient required a seated rest break following the 531f walk (around building) and following 3063fwalk (+ attention tasks on level, indoor surfaces) due to fatigue.      Self-Care   Self-Care Lifting   Lifting Patient retrieved objects from the floor requiring min guard from PT and cueing for technique.     Neuro Re-ed    Neuro Re-ed Details  At countertop: performed SLS on LLE + foot taps with RLE on cones. Progressed activty by decreasing UE support on  countertop, increasing the numer of foot taps completed before returning to double limb stance/starting position, compliant surface (1 pillow) under L foot. Patient required demonstration and cueing for technique with tactile cueing at pelvis to maintain proper weight shift and form.                   PT Short Term Goals - 12/14/16 1601      PT SHORT TERM GOAL #1   Title Patient will verbalize understanding of initial HEP to decrease her risk of falling. (TARGET DATE: 12/17/2016)   Time 4   Period Weeks   Status New     PT SHORT TERM GOAL #2   Title Patient will achieve a cognitive TUG score of < or = 18 seconds in order to decrease her risk of falling. (TARGET DATE: 12/17/2016)   Time 4   Period Weeks   Status New     PT SHORT TERM GOAL #3   Title Patient will demonstrate an ability to ambulate 500 feet with supervision and LRAD to indicate a decrease in her risk of falling. (TARGET DATE: 12/17/2016)   Baseline MET 12/14/2016    Time 4   Period Weeks   Status Achieved           PT Long Term Goals - 11/16/16 1334      PT LONG TERM GOAL #1   Title Patient will verbalize understanidng of HEP and ongoing fitness program. (TARGET DATE: 01/14/2017)    Time 8   Period Weeks   Status New     PT LONG TERM GOAL #2   Title Patient will achieve a score of >or = 45 to demonstrate a decrease in her risk of falling. (TARGET DATE: 01/14/2017)   Time 8   Period Weeks   Status New     PT LONG TERM GOAL #3   Title Patient will demonstrate ability to ambulate 1000 feet without AD including unlevel surfaces (grass, curbs, ramps) with supervision to indicate a return to patient's prior level of function. (TARGET DATE: 01/14/2017)   Time 8   Period Weeks   Status New     PT LONG TERM GOAL #4   Title Patient will acheive a functional gait assessment score of > or = 19 to indicate a decrease in her risk of falling. (TARGET DATE: 01/14/2017)   Time 8   Period Weeks   Status New      PT LONG TERM GOAL #5   Title Patient will achieve a cognitive TUG score of < or = 15.5 seconds to indicate a decrease in her risk of falling. (TARGET DATE: 01/14/2017)   Time 8   Period Weeks   Status New               Plan - 12/14/16 1610    Clinical Impression Statement Today's skilled PT session focused on beginning to assess the patient's short term goals and advancing her balance and gait activities. Patient has  achieved her goal to ambulate 500 feet with supervision and LRAD. The patient has a marked decrease in her gait speed when completing attention demanding tasks (talking). Therefore, PT worked with patient on ambulation through crowded spaces while holding a plate in her L hand while talking while PT was providing tactile cueing for patient to maintain gait velocity. PT progressed SLS balance activities to encourage weight shift onto LLE. Patient is making progress towards her goals, and will benefit from continued skilled PT to address functional mobility deficits.     Rehab Potential Good   Clinical Impairments Affecting Rehab Potential chest pain with exertion, type 2 DM, allergic rhinitis, GERD, arrhythmia    PT Frequency 2x / week   PT Duration 8 weeks   PT Treatment/Interventions ADLs/Self Care Home Management;Electrical Stimulation;DME Instruction;Gait training;Stair training;Functional mobility training;Therapeutic activities;Therapeutic exercise;Balance training;Patient/family education;Neuromuscular re-education;Manual techniques   PT Next Visit Plan G-code using FGA, assess STGs, advance balance activities on compliant surfaces with SLS with decreased UE support    Consulted and Agree with Plan of Care Patient      Patient will benefit from skilled therapeutic intervention in order to improve the following deficits and impairments:  Abnormal gait, Decreased activity tolerance, Decreased balance, Decreased knowledge of precautions, Decreased endurance, Decreased  knowledge of use of DME, Decreased mobility, Decreased range of motion, Decreased safety awareness, Difficulty walking, Decreased strength, Impaired sensation, Impaired tone, Postural dysfunction, Pain  Visit Diagnosis: Muscle weakness (generalized)  Other lack of coordination  Other abnormalities of gait and mobility  Unsteadiness on feet  History of falling     Problem List Patient Active Problem List   Diagnosis Date Noted  . Left leg weakness   . Cerebrovascular accident (CVA) ([place])   . Hyperlipidemia   . OSA (obstructive sleep apnea)   . Left-sided weakness 10/11/2016  . Orthostatic hypotension 09/18/2015  . Chest pain 08/15/2015  . Acute sinusitis 08/19/2014  . Yeast infection 08/19/2014  . Vitamin D deficiency 05/20/2014  . Gastro-esophageal reflux 03/20/2014  . Allergic rhinitis 07/09/2013  . Sleep disorder 07/09/2013  . Elevated blood pressure (not hypertension) 07/09/2013  . Mood disorder ([place]) 11/23/2011  . Diabetes mellitus ([place]) 11/23/2011  . Health care maintenance 11/23/2011    [place], SPT  12/14/2016, 4:11 PM  Jamey Reas, PT, DPT PT Specializing in [place] 12/14/16 8:49 PM Phone:  [phone]  Fax:  [phone] [facility] [address] [place], [place] [address]  [facility] [address] [place], [place], [address] Phone: [phone]   Fax:  [phone]  Name: Wendy Mcclain MRN: [id number] Date of Birth: 13-Aug-1950

## 2016-12-14 NOTE — Patient Instructions (Signed)
  Please complete the assigned speech therapy homework prior to your next session and return it to the speech therapist at your next visit.  

## 2016-12-14 NOTE — Therapy (Signed)
[facility] [address] [place], [place], [address] Phone: [phone]   Fax:  [phone]  Speech Language Pathology Treatment  Patient Details  Name: Wendy Mcclain MRN: [id number] Date of Birth: Dec 13, 1950 Referring Provider: Janora Norlander, DO  Encounter Date: 12/14/2016      End of Session - 12/14/16 1634    Visit Number 8   Number of Visits 17   Date for SLP Re-Evaluation 01/14/17   SLP Start Time 1532   SLP Stop Time  7673   SLP Time Calculation (min) 43 min   Activity Tolerance Patient tolerated treatment well      Past Medical History:  Diagnosis Date  . Arrhythmia   . Asthma   . Diabetes mellitus   . GERD (gastroesophageal reflux disease)     Past Surgical History:  Procedure Laterality Date  . ABDOMINAL HYSTERECTOMY  1993  . APPENDECTOMY  1963  . BREAST ENHANCEMENT SURGERY  1986    There were no vitals filed for this visit.      Subjective Assessment - 12/14/16 1540    Subjective Pt arrived with her planner today, with entries for Saturday, Friday last week.                ADULT SLP TREATMENT - 12/14/16 1541      General Information   Behavior/Cognition Alert;Pleasant mood;Cooperative     Treatment Provided   Treatment provided Cognitive-Linquistic     Cognitive-Linquistic Treatment   Treatment focused on Cognition   Skilled Treatment In word organization/categorization task, pt req'd min-mod A usually for verbal/linguistic organization, faded to rarely by task end. With simple detailed task (written directions), pt occasionally re-read directions multiple times, until SLP gave example (non-verbally) of underlining key content words. After this, pt read instructions once 98% of the time, and completed the task with 97% accuracy.     Assessment / Recommendations / Plan   Plan Continue with current plan of care     Progression Toward Goals   Progression toward goals  Progressing toward goals          SLP Education - 12/14/16 1633    Education provided Yes   Education Details using underlining for key words in written directions   Person(s) Educated Patient   Methods Demonstration   Comprehension Verbalized understanding;Returned demonstration          SLP Short Term Goals - 12/14/16 1553      SLP SHORT TERM GOAL #1   Title Pt will attend to details in mildly complex cognitive linguistic tasks with 85% accuracy and occasional min A   Time --   Period --   Status Achieved     SLP SHORT TERM GOAL #2   Title Pt will complete various naming tasks of increasing complexity with 90% success over 2 sessions, given mod assist   Status Achieved     SLP SHORT TERM GOAL #3   Title Pt will implement and use a system for functional recall of daily and scheduled activities with mod assistance over 2 sessions   Status Achieved          SLP Long Term Goals - 12/14/16 [place] #1   Title Pt will alternate attention between 2 mildly complex cognitive linguistic tasks with 85% on each and occasional min A   Time 5   Period Weeks   Status On-going     SLP LONG  TERM GOAL #2   Title Pt will complete Level III (abstract) naming tasks with 90% accuracy without cues   Time 5   Period Weeks     SLP LONG TERM GOAL #3   Title Pt will report daily functional and effective use of compensatory strategies for recall of daily activities and appointments.    Time 5   Period Weeks   Status On-going     SLP LONG TERM GOAL #4   Title pt will tell SLP 4 memory strategies over two sessions with modified independence   Time 5   Period Weeks   Status On-going          Plan - 12/14/16 1634    Clinical Impression Statement Pt continues to display deficits in memory and attention. Error awareness somewhat improved this session, and pt is making clear efforts to use systematic approach for awareness (emergent). Continues to require  assistance in simple verbal reasoning tasks and for word finding. Pt would cont to benefit from skilled ST to address cognitive linguistic and word finding deficits.   Speech Therapy Frequency 2x / week   Duration --  8 weeks   Treatment/Interventions Language facilitation;Environmental controls;Cueing hierarchy;SLP instruction and feedback;Compensatory techniques;Cognitive reorganization;Functional tasks;Multimodal communcation approach;Patient/family education;Internal/external aids   Potential to Achieve Goals Good      Patient will benefit from skilled therapeutic intervention in order to improve the following deficits and impairments:   Cognitive communication deficit    Problem List Patient Active Problem List   Diagnosis Date Noted  . Left leg weakness   . Cerebrovascular accident (CVA) ([place])   . Hyperlipidemia   . OSA (obstructive sleep apnea)   . Left-sided weakness 10/11/2016  . Orthostatic hypotension 09/18/2015  . Chest pain 08/15/2015  . Acute sinusitis 08/19/2014  . Yeast infection 08/19/2014  . Vitamin D deficiency 05/20/2014  . Gastro-esophageal reflux 03/20/2014  . Allergic rhinitis 07/09/2013  . Sleep disorder 07/09/2013  . Elevated blood pressure (not hypertension) 07/09/2013  . Mood disorder ([place]) 11/23/2011  . Diabetes mellitus ([place]) 11/23/2011  . Health care maintenance 11/23/2011    [facility] ,[place], [place]  12/14/2016, 4:38 PM  [facility] [address] [place], [place], [address] Phone: [phone]   Fax:  [phone]   Name: Wendy Mcclain MRN: [id number] Date of Birth: [birth year]

## 2016-12-14 NOTE — Therapy (Signed)
[facility] [address] [place], [place], [address] Phone: [phone]   Fax:  [phone]  Occupational Therapy Treatment  Patient Details  Name: Wendy Mcclain MRN: [id number] Date of Birth: 1950-10-13 Referring Provider: Dr Ronnie Doss, DO  Encounter Date: 12/14/2016      OT End of Session - 12/14/16 1628    Visit Number 7   Number of Visits 17   Date for OT Re-Evaluation 01/18/17   Authorization Type Medicare -    Authorization Time Period G code and progress note every 10th visit.   Authorization - Visit Number 7   Authorization - Number of Visits 10   OT Start Time 1450   OT Stop Time 1530   OT Time Calculation (min) 40 min   Activity Tolerance Patient tolerated treatment well   Behavior During Therapy WFL for tasks assessed/performed      Past Medical History:  Diagnosis Date  . Arrhythmia   . Asthma   . Diabetes mellitus   . GERD (gastroesophageal reflux disease)     Past Surgical History:  Procedure Laterality Date  . ABDOMINAL HYSTERECTOMY  1993  . APPENDECTOMY  1963  . BREAST ENHANCEMENT SURGERY  1986    There were no vitals filed for this visit.      Subjective Assessment - 12/14/16 1528    Pertinent History Chest pain with excertion; type 2 DM (insulin dependent); Allergic rhinitis; GERD; Arrhythmia. 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 Yes   Pain Score 3    Pain Location Shoulder   Pain Orientation Left   Pain Descriptors / Indicators Aching   Pain Type Chronic pain   Pain Onset More than a month ago   Pain Frequency Intermittent   Aggravating Factors  malpositioning   Pain Relieving Factors stretching, repositioning   Multiple Pain Sites No             Treatment: completing tangram puzzles to copy a design with min difficulty and increased time for improved visual perceptual and cognitive skills. Supine on mat gentle left shoulder joint and scapular mobs followed by AA/ROM closed chain shoulder flexion and unilateral shoulder flexion, min-mod facilitation. Seated closed chain mid range shoulder flexion with min-mod facilitation to left scapula, Pt reports improved shoulder flexion and decreased tightness at end of session. Open chain functional mid range reaching to place large pegs in vertical pegboard with LUE, min v.c./ facilitation to avoid compensation. Arm bike x 5 mins level 4 for reciprocal movement.                    OT Short Term Goals - 12/14/16 1530      OT SHORT TERM GOAL #1   Title Pt will be Mod I HEP for coordination LUE (Target date: 12/16/16)   Time 4   Period Weeks   Status Achieved     OT SHORT TERM GOAL #2   Title Pt will be Mod I strengthening HEP for LUE (Target date 12/16/16)   Time 4   Period Weeks   Status On-going     OT SHORT TERM GOAL #3   Title Pt will be supervision simple functional reach mid level using L UE as assist to dominant RUE w/o LOB during  simulated ADL's   Time 4   Period Weeks   Status Achieved           OT Long Term Goals - 12/09/16 1029      OT LONG TERM GOAL #1   Title Pt wil ldemonstrate improved coordination as seen by improved 9 hole peg test score left of 28 seconds or less & Mod I fastening buttons/fasteners when dressing.   Baseline 11/18/16: L = 34.81 seconds, R = 24.28 seconds; Min A fastening buttons, tying shoes etc.   Time 8   Period Weeks   Status New     OT LONG TERM GOAL #2   Title Pt will demonstrate improved LUE grip strength as seen by JAMAR grip assessment L of 15# or greater   Baseline 11/18/16 L = 4# vs R = 29# via JAMAR grip position II   Time 8   Period Weeks   Status New     OT LONG TERM GOAL #3   Title Pt will be Mod  I simple snack prep in ADL kitchen w/o LOB   Time 8   Period Weeks   Status New     OT LONG TERM GOAL #4   Title Pt will be supervision simple bill pay, check writing   Time 8   Period Weeks   Status New     OT LONG TERM GOAL #5   Title Pt will be supervision memory strategies and cognitive compensatory strategies during ADL tasks   Time 8   Period Weeks   Status New     OT LONG TERM GOAL #6   Title Pt will be Mod I compensatory strategies due to visual changes during ADL/homemaking activities in ADL kitchen   Time 8   Period Weeks   Status New               Plan - 12/14/16 1529    Clinical Impression Statement Pt is progressing towards goals for LUE functional use and overhead reach   Rehab Potential Good   OT Frequency 2x / week   OT Duration 8 weeks   OT Treatment/Interventions Self-care/ADL training;Therapeutic exercise;Cognitive remediation/compensation;Neuromuscular education;Visual/perceptual remediation/compensation;Energy conservation;Therapist, nutritional;Therapeutic exercises;Patient/family education;DME and/or AE instruction;Therapeutic activities   Plan NMR for overhead reach, add to HEP prn   OT Home Exercise Plan issued coordination, ball ex supine   Consulted and Agree with Plan of Care Patient      Patient will benefit from skilled therapeutic intervention in order to improve the following deficits and impairments:  Decreased cognition, Decreased knowledge of use of DME, Pain, Decreased coordination, Decreased mobility, Impaired sensation, Decreased strength, Decreased endurance, Decreased activity tolerance, Decreased range of motion, Decreased balance, Decreased knowledge of precautions, Decreased safety awareness, Impaired perceived functional ability  Visit Diagnosis: Muscle weakness (generalized)  Other lack of coordination  Hemiplegia and hemiparesis following cerebral infarction affecting left non-dominant side (HCC)  Visuospatial  deficit    Problem List Patient Active Problem List   Diagnosis Date Noted  . Left leg weakness   . Cerebrovascular accident (CVA) ([place])   . Hyperlipidemia   . OSA (obstructive sleep apnea)   . Left-sided weakness 10/11/2016  . Orthostatic hypotension 09/18/2015  . Chest pain 08/15/2015  . Acute sinusitis 08/19/2014  . Yeast infection 08/19/2014  . Vitamin D deficiency 05/20/2014  . Gastro-esophageal reflux 03/20/2014  . Allergic rhinitis 07/09/2013  . Sleep disorder 07/09/2013  . Elevated blood pressure (not hypertension) 07/09/2013  . Mood disorder ([place]) 11/23/2011  .  Diabetes mellitus ([place]) 11/23/2011  . Health care maintenance 11/23/2011    Eliora Nienhuis 12/14/2016, 4:31 PM  [name] [address] [place], [place], [address] Phone: [phone]   Fax:  [phone]  Name: MONIA TIMMERS MRN: [id number] Date of Birth: 1951-01-22

## 2016-12-16 ENCOUNTER — Ambulatory Visit: Payer: Medicare Other | Admitting: Occupational Therapy

## 2016-12-16 ENCOUNTER — Ambulatory Visit: Payer: Medicare Other | Admitting: Speech Pathology

## 2016-12-16 ENCOUNTER — Ambulatory Visit: Payer: Medicare Other | Admitting: Physical Therapy

## 2016-12-16 ENCOUNTER — Encounter: Payer: Self-pay | Admitting: Physical Therapy

## 2016-12-16 DIAGNOSIS — R2689 Other abnormalities of gait and mobility: Secondary | ICD-10-CM | POA: Diagnosis not present

## 2016-12-16 DIAGNOSIS — R2681 Unsteadiness on feet: Secondary | ICD-10-CM

## 2016-12-16 DIAGNOSIS — R278 Other lack of coordination: Secondary | ICD-10-CM

## 2016-12-16 DIAGNOSIS — R41842 Visuospatial deficit: Secondary | ICD-10-CM

## 2016-12-16 DIAGNOSIS — R41841 Cognitive communication deficit: Secondary | ICD-10-CM

## 2016-12-16 DIAGNOSIS — I69354 Hemiplegia and hemiparesis following cerebral infarction affecting left non-dominant side: Secondary | ICD-10-CM | POA: Diagnosis not present

## 2016-12-16 DIAGNOSIS — M6281 Muscle weakness (generalized): Secondary | ICD-10-CM

## 2016-12-16 DIAGNOSIS — I69318 Other symptoms and signs involving cognitive functions following cerebral infarction: Secondary | ICD-10-CM

## 2016-12-16 NOTE — Therapy (Signed)
[facility] [address] [place], [place], [address] Phone: [phone]   Fax:  [phone]  Occupational Therapy Treatment  Patient Details  Name: Wendy Mcclain MRN: [id number] Date of Birth: 1951/05/21 Referring Provider: Dr Ronnie Doss, DO  Encounter Date: 12/16/2016      OT End of Session - 12/16/16 1431    Visit Number 8   Number of Visits 17   Date for OT Re-Evaluation 01/18/17   Authorization Type Medicare -    Authorization Time Period G code and progress note every 10th visit.   Authorization - Visit Number 8   Authorization - Number of Visits 10   OT Start Time 1404   OT Stop Time 1445   OT Time Calculation (min) 41 min   Activity Tolerance Patient tolerated treatment well   Behavior During Therapy WFL for tasks assessed/performed      Past Medical History:  Diagnosis Date  . Arrhythmia   . Asthma   . Diabetes mellitus   . GERD (gastroesophageal reflux disease)     Past Surgical History:  Procedure Laterality Date  . ABDOMINAL HYSTERECTOMY  1993  . APPENDECTOMY  1963  . BREAST ENHANCEMENT SURGERY  1986    There were no vitals filed for this visit.      Subjective Assessment - 12/16/16 1430    Pertinent History Chest pain with excertion; type 2 DM (insulin dependent); Allergic rhinitis; GERD; Arrhythmia. 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 Seated with arms in external rotation, scapular retraction performed. Seated closed chain mid range shoulder flexion with  min-mod facilitation to left scapula, Pt reports improved shoulder flexion and decreased tightness at end of session. Open chain functional mid range reaching to place graded clothespins on vertical antennae with LUE, min v.c./ facilitation to avoid compensation. Grooved pegboard with LUE for increased fine motor coordination, min difficulty/ v.c. Scanning activity with cognitive component cross out the number that repeats 4 x in a row, increased time required, min- mod  difficulty                     OT Short Term Goals - 12/16/16 1433      OT SHORT TERM GOAL #1   Title Pt will be Mod I HEP for coordination LUE (Target date: 12/16/16)   Time 4   Period Weeks   Status Achieved     OT SHORT TERM GOAL #2   Title Pt will be Mod I strengthening HEP for LUE (Target date 12/16/16)   Time 4   Period Weeks   Status On-going  Pt is perfoming ball ex in supine, have not progressed to shoulder strengthening, putty for hand strength     OT SHORT TERM GOAL #3   Title Pt will be supervision simple functional reach mid level using L UE as assist to dominant RUE w/o LOB during simulated ADL's   Time 4   Period Weeks   Status Achieved           OT Long Term Goals -  12/09/16 1029      OT LONG TERM GOAL #1   Title Pt wil ldemonstrate improved coordination as seen by improved 9 hole peg test score left of 28 seconds or less & Mod I fastening buttons/fasteners when dressing.   Baseline 11/18/16: L = 34.81 seconds, R = 24.28 seconds; Min A fastening buttons, tying shoes etc.   Time 8   Period Weeks   Status New     OT LONG TERM GOAL #2   Title Pt will demonstrate improved LUE grip strength as seen by JAMAR grip assessment L of 15# or greater   Baseline 11/18/16 L = 4# vs R = 29# via JAMAR grip position II   Time 8   Period Weeks   Status New     OT LONG TERM GOAL #3   Title Pt will be Mod I simple snack prep in ADL kitchen w/o LOB   Time 8   Period Weeks   Status New      OT LONG TERM GOAL #4   Title Pt will be supervision simple bill pay, check writing   Time 8   Period Weeks   Status New     OT LONG TERM GOAL #5   Title Pt will be supervision memory strategies and cognitive compensatory strategies during ADL tasks   Time 8   Period Weeks   Status New     OT LONG TERM GOAL #6   Title Pt will be Mod I compensatory strategies due to visual changes during ADL/homemaking activities in ADL kitchen   Time 8   Period Weeks   Status New               Plan - 12/16/16 1434    Clinical Impression Statement Pt is progressing towards goals. Pt demonstrates improving LUE functioanl reach after mobilization and exercise in supine.   Rehab Potential Good   OT Frequency 2x / week   OT Duration 8 weeks   OT Treatment/Interventions Self-care/ADL training;Therapeutic exercise;Cognitive remediation/compensation;Neuromuscular education;Visual/perceptual remediation/compensation;Energy conservation;Therapist, nutritional;Therapeutic exercises;Patient/family education;DME and/or AE instruction;Therapeutic activities   Plan continue neuro-re ed in prep for overhaed reach, cognitive/ visual perceptual skills   OT Home Exercise Plan issued coordination, ball ex supine   Consulted and Agree with Plan of Care Patient      Patient will benefit from skilled therapeutic intervention in order to improve the following deficits and impairments:  Decreased cognition, Decreased knowledge of use of DME, Pain, Decreased coordination, Decreased mobility, Impaired sensation, Decreased strength, Decreased endurance, Decreased activity tolerance, Decreased range of motion, Decreased balance, Decreased knowledge of precautions, Decreased safety awareness, Impaired perceived functional ability  Visit Diagnosis: No diagnosis found.    Problem List Patient Active Problem List   Diagnosis Date Noted  . Left leg weakness   . Cerebrovascular accident (CVA) ([facility])   .  Hyperlipidemia   . OSA (obstructive sleep apnea)   . Left-sided weakness 10/11/2016  . Orthostatic hypotension 09/18/2015  . Chest pain 08/15/2015  . Acute sinusitis 08/19/2014  . Yeast infection 08/19/2014  . Vitamin D deficiency 05/20/2014  . Gastro-esophageal reflux 03/20/2014  . Allergic rhinitis 07/09/2013  . Sleep disorder 07/09/2013  . Elevated blood pressure (not hypertension) 07/09/2013  . Mood disorder ([facility]) 11/23/2011  . Diabetes mellitus ([place]) 11/23/2011  . Health care maintenance 11/23/2011    Rayshon Albaugh 12/16/2016, 2:40 PM  [facility] [address] [place], [place], [address] Phone: [phone]   Fax:  [place]  Name: MAKYRA CORPREW MRN: [id number] Date of Birth: 1951/01/05

## 2016-12-16 NOTE — Therapy (Signed)
[facility] [address] [place], [place], [address] Phone: [phone]   Fax:  [phone]  Speech Language Pathology Treatment  Patient Details  Name: Wendy Mcclain MRN: [id number] Date of Birth: January 29, 1951 Referring Provider: Janora Norlander, DO  Encounter Date: 12/16/2016      End of Session - 12/16/16 1626    Visit Number 9   Number of Visits 17   Date for SLP Re-Evaluation 01/14/17   SLP Start Time 1530   SLP Stop Time  1545   SLP Time Calculation (min) 15 min   Activity Tolerance Patient tolerated treatment well      Past Medical History:  Diagnosis Date  . Arrhythmia   . Asthma   . Diabetes mellitus   . GERD (gastroesophageal reflux disease)     Past Surgical History:  Procedure Laterality Date  . ABDOMINAL HYSTERECTOMY  1993  . APPENDECTOMY  1963  . BREAST ENHANCEMENT SURGERY  1986    There were no vitals filed for this visit.      Subjective Assessment - 12/16/16 1543    Subjective "I've been doing real good with it," pt re: planner. She has been keeping detailed entries   Currently in Pain? No/denies               ADULT SLP TREATMENT - 12/16/16 1545      General Information   Behavior/Cognition Alert;Pleasant mood;Cooperative   Patient Positioning Upright in chair   Oral care provided N/A     Treatment Provided   Treatment provided Cognitive-Linquistic     Pain Assessment   Pain Assessment No/denies pain     Cognitive-Linquistic Treatment   Treatment focused on Cognition   Skilled Treatment SLP targeted alternating attention this session with moderately complex functional task using pt's planner and internet search. 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 Pt navigated to an appropriate website to search for a course and with extended time and rare  min A to attend to specific details, located eligible courses. Rare min A to read all content on the page in order to identify search feature to narrow her search by city. Returned to task independently following SLP interruptions, alternated attention between online schedule and her personal planner with rare min A to record possiblities and write a reminder to herself. Following intial cues to write specific details, SLP faded support and pt recorded relevant details independently.      Assessment / Recommendations / Plan   Plan Continue with current plan of care     Progression Toward Goals   Progression toward goals Progressing toward goals          SLP Education - 12/16/16 1625    Education provided Yes   Education Details attention to details, recording specific information in planner   Person(s) Educated Patient   Methods Explanation   Comprehension Verbalized understanding          SLP Short Term Goals - 12/16/16 1708      SLP SHORT TERM GOAL #1   Title Pt will attend to details in mildly complex cognitive linguistic tasks with 85% accuracy and occasional min A   Period Weeks   Status Achieved     SLP SHORT TERM GOAL #2   Title Pt will complete various naming tasks of increasing complexity with 90% success over  2 sessions, given mod assist   Period Weeks   Status Achieved     SLP SHORT TERM GOAL #3   Title Pt will implement and use a system for functional recall of daily and scheduled activities with mod assistance over 2 sessions   Status Achieved          SLP Long Term Goals - 12/16/16 [place] #1   Title Pt will alternate attention between 2 mildly complex cognitive linguistic tasks with 85% on each and occasional min A   Time 5   Period Weeks   Status On-going     SLP LONG TERM GOAL #2   Title Pt will complete Level III (abstract) naming tasks with 90% accuracy without cues   Time 5   Period Weeks     SLP LONG TERM GOAL #3    Title Pt will report daily functional and effective use of compensatory strategies for recall of daily activities and appointments.    Time 5   Period Weeks   Status On-going          Plan - 12/16/16 1708    Clinical Impression Statement Pt continues to display deficits in memory and attention. Pt is improving her use of external aid and requires reduced need for cuing this session to record and recall functional information. Continues to require cuing for attention to details. Pt would cont to benefit from skilled ST to address cognitive linguistic and word finding deficits.   Speech Therapy Frequency 2x / week   Treatment/Interventions Language facilitation;Environmental controls;Cueing hierarchy;SLP instruction and feedback;Compensatory techniques;Cognitive reorganization;Functional tasks;Multimodal communcation approach;Patient/family education;Internal/external aids   Potential to Achieve Goals Good   SLP Home Exercise Plan research continuing education classes that fit her therapy/leisure schedule   Consulted and Agree with Plan of Care Patient      Patient will benefit from skilled therapeutic intervention in order to improve the following deficits and impairments:   Cognitive communication deficit    Problem List Patient Active Problem List   Diagnosis Date Noted  . Left leg weakness   . Cerebrovascular accident (CVA) ([place] [facility])   . Hyperlipidemia   . OSA (obstructive sleep apnea)   . Left-sided weakness 10/11/2016  . Orthostatic hypotension 09/18/2015  . Chest pain 08/15/2015  . Acute sinusitis 08/19/2014  . Yeast infection 08/19/2014  . Vitamin D deficiency 05/20/2014  . Gastro-esophageal reflux 03/20/2014  . Allergic rhinitis 07/09/2013  . Sleep disorder 07/09/2013  . Elevated blood pressure (not hypertension) 07/09/2013  . Mood disorder ([facility]) 11/23/2011  . Diabetes mellitus ([place]) 11/23/2011  . Health care maintenance 11/23/2011   Deneise Lever, Paul,  [place] 12/16/2016, 5:09 PM  [facility] [address] [place], [place], [address] Phone: [phone]   Fax:  [phone]   Name: DALE STRAUSSER MRN: [id number] Date of Birth: Sep 03, 1950

## 2016-12-16 NOTE — Therapy (Signed)
[facility] [address] [place], [place], [address] Phone: [phone]   Fax:  [phone]  Physical Therapy Treatment  Patient Details  Name: JENEA DAKE MRN: [id number] Date of Birth: [birth year] Referring Provider: Ronnie Doss DO  Talbert Cage MD)  Encounter Date: 12/16/2016      PT End of Session - 12/16/16 1543    Visit Number 10   Number of Visits 17   Date for PT Re-Evaluation 01/14/17   Authorization Type Medicare & G-codes    PT Start Time 10/08/1450   PT Stop Time 1530   PT Time Calculation (min) 38 min   Behavior During Therapy [facility] for tasks assessed/performed      Past Medical History:  Diagnosis Date  . Arrhythmia   . Asthma   . Diabetes mellitus   . GERD (gastroesophageal reflux disease)     Past Surgical History:  Procedure Laterality Date  . ABDOMINAL HYSTERECTOMY  1993  . APPENDECTOMY  1963  . BREAST ENHANCEMENT SURGERY  1986    There were no vitals filed for this visit.      Subjective Assessment - 12/16/16 1453    Subjective Pt reports walking about 30 min most days with grandson with several standing rest breaks.  Pt reports that her energy seems to be getting better.   Currently in Pain? No/denies   Pain Onset More than a month ago            [facility] PT Assessment - 12/16/16 0001      Timed Up and Go Test   TUG Normal TUG;Cognitive TUG   Normal TUG (seconds) 10.91   Cognitive TUG (seconds) 15.3   TUG Comments      Functional Gait  Assessment   Gait assessed  Yes   Gait Level Surface Walks 20 ft in less than 7 sec but greater than 5.5 sec, uses assistive device, slower speed, mild gait deviations, or deviates 6-10 in outside of the 12 in walkway width.   Change in Gait Speed Able to smoothly change walking speed without loss of balance or gait deviation. Deviate no more than 6 in outside of the 12 in walkway width.   Gait with Horizontal Head Turns Performs head  turns smoothly with slight change in gait velocity (eg, minor disruption to smooth gait path), deviates 6-10 in outside 12 in walkway width, or uses an assistive device.   Gait with Vertical Head Turns Performs head turns with no change in gait. Deviates no more than 6 in outside 12 in walkway width.   Gait and Pivot Turn Pivot turns safely within 3 sec and stops quickly with no loss of balance.   Step Over Obstacle Is able to step over one shoe box (4.5 in total height) without changing gait speed. No evidence of imbalance.   Gait with Narrow Base of Support Is able to ambulate for 10 steps heel to toe with no staggering.   Gait with Eyes Closed Walks 20 ft, slow speed, abnormal gait pattern, evidence for imbalance, deviates 10-15 in outside 12 in walkway width. Requires more than 9 sec to ambulate 20 ft.   Ambulating Backwards Walks 20 ft, uses assistive device, slower speed, mild gait deviations, deviates 6-10 in outside 12 in walkway width.   Steps Alternating feet, must use rail.   Total Score 23  Balance Exercises - 12/16/16 1512      Balance Exercises: Standing   SLS Cognitive challenge;Intermittent upper extremity support  one foot on ball, switching multple times   Wall Bumps Shoulder;Hip  up to balls of feet; intermittent UE support.             PT Short Term Goals - 12/16/16 1537      PT SHORT TERM GOAL #1   Title Patient will verbalize understanding of initial HEP to decrease her risk of falling. (TARGET DATE: 12/17/2016)   Baseline Met; 12/16/16.   Time 4   Period Weeks   Status Achieved     PT SHORT TERM GOAL #2   Title Patient will achieve a cognitive TUG score of < or = 18 seconds in order to decrease her risk of falling. (TARGET DATE: 12/17/2016)   Baseline Met cognitive TUG 15.3 sec; 12/16/16.   Time 4   Period Weeks   Status Achieved     PT SHORT TERM GOAL #3   Title Patient will demonstrate an ability to ambulate  500 feet with supervision and LRAD to indicate a decrease in her risk of falling. (TARGET DATE: 12/17/2016)   Baseline MET 12/14/2016    Time 4   Period Weeks   Status Achieved           PT Long Term Goals - 11/16/16 1334      PT LONG TERM GOAL #1   Title Patient will verbalize understanidng of HEP and ongoing fitness program. (TARGET DATE: 01/14/2017)    Time 8   Period Weeks   Status New     PT LONG TERM GOAL #2   Title Patient will achieve a score of >or = 45 to demonstrate a decrease in her risk of falling. (TARGET DATE: 01/14/2017)   Time 8   Period Weeks   Status New     PT LONG TERM GOAL #3   Title Patient will demonstrate ability to ambulate 1000 feet without AD including unlevel surfaces (grass, curbs, ramps) with supervision to indicate a return to patient's prior level of function. (TARGET DATE: 01/14/2017)   Time 8   Period Weeks   Status New     PT LONG TERM GOAL #4   Title Patient will acheive a functional gait assessment score of > or = 19 to indicate a decrease in her risk of falling. (TARGET DATE: 01/14/2017)   Time 8   Period Weeks   Status New     PT LONG TERM GOAL #5   Title Patient will achieve a cognitive TUG score of < or = 15.5 seconds to indicate a decrease in her risk of falling. (TARGET DATE: 01/14/2017)   Time 8   Period Weeks   Status New               Plan - 12/16/16 1523    Clinical Impression Statement Progressed with FGA scored  23/30.  Worked on SLS with cognitive task; pt required intermittent  UE support.  Pt met all STGs.   Rehab Potential Good   Clinical Impairments Affecting Rehab Potential chest pain with exertion, type 2 DM, allergic rhinitis, GERD, arrhythmia    PT Frequency 2x / week   PT Duration 8 weeks   PT Treatment/Interventions ADLs/Self Care Home Management;Electrical Stimulation;DME Instruction;Gait training;Stair training;Functional mobility training;Therapeutic activities;Therapeutic exercise;Balance  training;Patient/family education;Neuromuscular re-education;Manual techniques   PT Next Visit Plan advance balance activities on compliant surfaces with SLS with decreased UE support  Consulted and Agree with Plan of Care Patient      Patient will benefit from skilled therapeutic intervention in order to improve the following deficits and impairments:  Abnormal gait, Decreased activity tolerance, Decreased balance, Decreased knowledge of precautions, Decreased endurance, Decreased knowledge of use of DME, Decreased mobility, Decreased range of motion, Decreased safety awareness, Difficulty walking, Decreased strength, Impaired sensation, Impaired tone, Postural dysfunction, Pain  Visit Diagnosis: Other lack of coordination  Other abnormalities of gait and mobility  Unsteadiness on feet  Hemiplegia and hemiparesis following cerebral infarction affecting left non-dominant side (HCC)       G-Codes - 12-Jan-2017 1539    Functional Assessment Tool Used (Outpatient Only) Functional Gait Assessment 23/30      Problem List Patient Active Problem List   Diagnosis Date Noted  . Left leg weakness   . Cerebrovascular accident (CVA) ([place])   . Hyperlipidemia   . OSA (obstructive sleep apnea)   . Left-sided weakness 10/11/2016  . Orthostatic hypotension 09/18/2015  . Chest pain 08/15/2015  . Acute sinusitis 08/19/2014  . Yeast infection 08/19/2014  . Vitamin D deficiency 05/20/2014  . Gastro-esophageal reflux 03/20/2014  . Allergic rhinitis 07/09/2013  . Sleep disorder 07/09/2013  . Elevated blood pressure (not hypertension) 07/09/2013  . Mood disorder ([place]) 11/23/2011  . Diabetes mellitus ([place]) 11/23/2011  . Health care maintenance 11/23/2011    Bjorn Loser, PTA  01-12-2017, 3:43 PM [facility] [address] [place], [place], [address] Phone: [phone]   Fax:  [phone]  Name: KALIA VAHEY MRN: [id number] Date  of Birth: Dec 03, 1950     G-Codes - 01/12/2017 1539    Functional Assessment Tool Used (Outpatient Only) Functional Gait Assessment 23/30   Functional Limitation Mobility: Walking and moving around   Mobility: Walking and Moving Around Current Status [phone]) At least 20 percent but less than 40 percent impaired, limited or restricted   Mobility: Walking and Moving Around Goal Status [phone]) At least 1 percent but less than 20 percent impaired, limited or restricted      Physical Therapy Progress Note  Dates of Reporting Period: 11/16/2016 to 01/12/2017  Objective Reports of Subjective Statement: Patient reports improved function with less balance losses.   Objective Measurements: see above  Goal Update: see above  Plan: Continue established plan  Reason Skilled Services are Required: Patient still has potential to further improve balance & functional activities with lower fall risk with skilled instruction.  Jamey Reas, PT, DPT PT Specializing in [place] 12/17/16 9:33 AM Phone:  [phone]  Fax:  [phone] [facility] [address] [place], [place] [address]

## 2016-12-21 ENCOUNTER — Ambulatory Visit: Payer: Medicare Other | Admitting: Physical Therapy

## 2016-12-21 ENCOUNTER — Encounter: Payer: Self-pay | Admitting: Physical Therapy

## 2016-12-21 ENCOUNTER — Ambulatory Visit: Payer: Medicare Other | Admitting: Occupational Therapy

## 2016-12-21 ENCOUNTER — Ambulatory Visit: Payer: Medicare Other | Admitting: Speech Pathology

## 2016-12-21 ENCOUNTER — Telehealth: Payer: Self-pay | Admitting: Nurse Practitioner

## 2016-12-21 VITALS — BP 118/68

## 2016-12-21 DIAGNOSIS — R278 Other lack of coordination: Secondary | ICD-10-CM

## 2016-12-21 DIAGNOSIS — R2681 Unsteadiness on feet: Secondary | ICD-10-CM | POA: Diagnosis not present

## 2016-12-21 DIAGNOSIS — R2689 Other abnormalities of gait and mobility: Secondary | ICD-10-CM

## 2016-12-21 DIAGNOSIS — M6281 Muscle weakness (generalized): Secondary | ICD-10-CM

## 2016-12-21 DIAGNOSIS — R41842 Visuospatial deficit: Secondary | ICD-10-CM

## 2016-12-21 DIAGNOSIS — R41841 Cognitive communication deficit: Secondary | ICD-10-CM

## 2016-12-21 DIAGNOSIS — I69354 Hemiplegia and hemiparesis following cerebral infarction affecting left non-dominant side: Secondary | ICD-10-CM

## 2016-12-21 DIAGNOSIS — I69318 Other symptoms and signs involving cognitive functions following cerebral infarction: Secondary | ICD-10-CM

## 2016-12-21 DIAGNOSIS — Z9181 History of falling: Secondary | ICD-10-CM

## 2016-12-21 NOTE — Therapy (Signed)
[facility] [address] [place], [place], [address] Phone: [phone]   Fax:  [phone]  Occupational Therapy Treatment  Patient Details  Name: Wendy Mcclain MRN: [id number] Date of Birth: Mar 14, 1951 Referring Provider: Dr Ronnie Doss, DO  Encounter Date: 12/21/2016      OT End of Session - 12/21/16 1328    Visit Number 9   Number of Visits 17   Date for OT Re-Evaluation 01/18/17   Authorization Type Medicare - G code next visit   Authorization Time Period G code and progress note every 10th visit.   Authorization - Visit Number 9   Authorization - Number of Visits 10   OT Start Time 8921   OT Stop Time 1400   OT Time Calculation (min) 43 min   Activity Tolerance Patient tolerated treatment well      Past Medical History:  Diagnosis Date  . Arrhythmia   . Asthma   . Diabetes mellitus   . GERD (gastroesophageal reflux disease)     Past Surgical History:  Procedure Laterality Date  . ABDOMINAL HYSTERECTOMY  1993  . APPENDECTOMY  1963  . BREAST ENHANCEMENT SURGERY  1986    There were no vitals filed for this visit.      Subjective Assessment - 12/21/16 1326    Pertinent History Chest pain with excertion; type 2 DM (insulin dependent); Allergic rhinitis; GERD; Arrhythmia. 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 Yes   Pain Score 3    Pain Location Shoulder   Pain Orientation Left   Pain Descriptors / Indicators Aching   Pain Type Chronic pain   Pain Onset More than a month ago   Pain Frequency Intermittent   Aggravating Factors  malpositioning   Pain Relieving Factors repositioning   Effect of Pain on Daily Activities limits functional reach   Multiple Pain Sites  Yes   Pain Score 4   Pain Location Head   Pain Descriptors / Indicators Aching   Pain Type Acute pain   Pain Onset In the past 7 days   Pain Frequency Intermittent   Aggravating Factors  unknown   Pain Relieving Factors unknown       Treatment:Supine on mat gentle left shoulder joint and scapular mobs followed by AA/ROM closed chain shoulder flexion and unilateral shoulder flexion, min-mod facilitation.  Prone scapular retraction with bilateral UE extension x 10 reps, min v.c., min facilitation Prone on elbows sliding alternate arm forwards and back wards for UE strength, core stability Quadraped rolling large ball forwards and backwards for bilateral AA/ROM shoulder flexion. Wall slides with bilateral UE's then unilateral LUE, min facilitation/ v.c Seated closed chain mid-high range shoulder flexion with PVC pipe frame, min/mod facilitation at left scapula followed by functional reaching with  LUE to copy small peg design, (initally performing at high range  then due to pt fatigue pt performed at just below mid range, min v.c for postioning and to avoid compensation. Pt was able to complete design correctly without v.c                         OT Short Term Goals - 12/16/16 1433      OT SHORT TERM GOAL #1   Title  Pt will be Mod I HEP for coordination LUE (Target date: 12/16/16)   Time 4   Period Weeks   Status Achieved     OT SHORT TERM GOAL #2   Title Pt will be Mod I strengthening HEP for LUE (Target date 12/16/16)   Time 4   Period Weeks   Status On-going  Pt is perfoming ball ex in supine, have not progressed to shoulder strengthening, putty for hand strength     OT SHORT TERM GOAL #3   Title Pt will be supervision simple functional reach mid level using L UE as assist to dominant RUE w/o LOB during simulated ADL's   Time 4   Period Weeks   Status Achieved           OT Long Term Goals - 12/09/16 1029      OT LONG TERM GOAL #1   Title Pt wil  ldemonstrate improved coordination as seen by improved 9 hole peg test score left of 28 seconds or less & Mod I fastening buttons/fasteners when dressing.   Baseline 11/18/16: L = 34.81 seconds, R = 24.28 seconds; Min A fastening buttons, tying shoes etc.   Time 8   Period Weeks   Status New     OT LONG TERM GOAL #2   Title Pt will demonstrate improved LUE grip strength as seen by JAMAR grip assessment L of 15# or greater   Baseline 11/18/16 L = 4# vs R = 29# via JAMAR grip position II   Time 8   Period Weeks   Status New     OT LONG TERM GOAL #3   Title Pt will be Mod I simple snack prep in ADL kitchen w/o LOB   Time 8   Period Weeks   Status New     OT LONG TERM GOAL #4   Title Pt will be supervision simple bill pay, check writing   Time 8   Period Weeks   Status New     OT LONG TERM GOAL #5   Title Pt will be supervision memory strategies and cognitive compensatory strategies during ADL tasks   Time 8   Period Weeks   Status New     OT LONG TERM GOAL #6   Title Pt will be Mod I compensatory strategies due to visual changes during ADL/homemaking activities in ADL kitchen   Time 8   Period Weeks   Status New               Plan - 12/21/16 1329    Clinical Impression Statement Pt is progressing towards goals. She responds well to scapular mobilization prior to functional reaching.    Rehab Potential Good   OT Frequency 2x / week   OT Duration 8 weeks   OT Treatment/Interventions Self-care/ADL training;Therapeutic exercise;Cognitive remediation/compensation;Neuromuscular education;Visual/perceptual remediation/compensation;Energy conservation;Therapist, nutritional;Therapeutic exercises;Patient/family education;DME and/or AE instruction;Therapeutic activities   Plan continue to address functional overhead reach after scapular mobs/ facilitation, cognitive / visual perceptual skills   OT Home Exercise Plan issued coordination, ball ex supine   Consulted and  Agree with Plan of Care Patient      Patient will benefit from skilled therapeutic intervention in order to improve the following deficits and impairments:  Decreased cognition, Decreased knowledge of use of DME, Pain, Decreased coordination, Decreased mobility, Impaired sensation, Decreased strength, Decreased endurance, Decreased activity tolerance, Decreased range of motion, Decreased balance, Decreased knowledge of precautions, Decreased safety awareness, Impaired perceived functional ability  Visit Diagnosis:  Other lack of coordination  Muscle weakness (generalized)  Visuospatial deficit  Other symptoms and signs involving cognitive functions following cerebral infarction    Problem List Patient Active Problem List   Diagnosis Date Noted  . Left leg weakness   . Cerebrovascular accident (CVA) ([place])   . Hyperlipidemia   . OSA (obstructive sleep apnea)   . Left-sided weakness 10/11/2016  . Orthostatic hypotension 09/18/2015  . Chest pain 08/15/2015  . Acute sinusitis 08/19/2014  . Yeast infection 08/19/2014  . Vitamin D deficiency 05/20/2014  . Gastro-esophageal reflux 03/20/2014  . Allergic rhinitis 07/09/2013  . Sleep disorder 07/09/2013  . Elevated blood pressure (not hypertension) 07/09/2013  . Mood disorder ([place]) 11/23/2011  . Diabetes mellitus ([place]) 11/23/2011  . Health care maintenance 11/23/2011    Imane Burrough 12/21/2016, 1:46 PM  [facility] [address] [place], [place], [address] Phone: [phone]   Fax:  [phone]  Name: RICKESHA VERACRUZ MRN: [id number] Date of Birth: January 04, 1951

## 2016-12-21 NOTE — Telephone Encounter (Signed)
Rn call patient about having headaches. Pt stated she was told to take 2 to 3 tylenol daily for the headache. Pt stated the headache has been frequent the last two weeks. She takes the tylenol and it comes right back. Appt schedule with NP. Pt currently takes medications for allergies.

## 2016-12-21 NOTE — Therapy (Signed)
[facility] [address] [place], [place], [address] Phone: [phone]   Fax:  [phone]  Speech Language Pathology Treatment  Patient Details  Name: Wendy Mcclain MRN: [id number] Date of Birth: 1951/01/05 Referring Provider: Janora Norlander, DO  Encounter Date: 12/21/2016      End of Session - 12/21/16 1616    Visit Number 10   Number of Visits 17   Date for SLP Re-Evaluation 01/14/17   SLP Start Time 1147   SLP Stop Time  1230   SLP Time Calculation (min) 43 min   Activity Tolerance Patient tolerated treatment well      Past Medical History:  Diagnosis Date  . Arrhythmia   . Asthma   . Diabetes mellitus   . GERD (gastroesophageal reflux disease)     Past Surgical History:  Procedure Laterality Date  . ABDOMINAL HYSTERECTOMY  1993  . APPENDECTOMY  1963  . BREAST ENHANCEMENT SURGERY  1986    There were no vitals filed for this visit.      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 Yes   Pain Score 3    Pain Location Head   Pain Orientation Right;Left  temples   Pain Descriptors / Indicators Headache   Pain Type Acute pain   Pain Onset In the past 7 days   Pain Frequency Constant   Pain Relieving Factors tylenol   Effect of Pain on Daily Activities limiting activities, resting more   Multiple Pain Sites No               ADULT SLP TREATMENT - 12/21/16 1158      General Information   Behavior/Cognition Alert;Pleasant mood;Cooperative   Patient Positioning Upright in chair   Oral care provided N/A     Treatment Provided   Treatment provided Cognitive-Linquistic     Cognitive-Linquistic Treatment   Treatment focused on Cognition   Skilled Treatment Targeted alternating attention during moderately complex naming tasks this session. Items in a category/  decoding abbreviations with 100% accuracy, and extended time. Divergent naming/convergent naming tasks 90% accuracy.     Assessment / Recommendations / Plan   Plan Continue with current plan of care     Progression Toward Goals   Progression toward goals Progressing toward goals          SLP Education - 12/21/16 1616    Education provided Yes   Education Details review work after completing for error correction   Person(s) Educated Patient   Methods Explanation   Comprehension Verbalized understanding          SLP Short Term Goals - 12/21/16 1617      SLP SHORT TERM GOAL #1   Title Pt will attend to details in mildly complex cognitive linguistic tasks with 85% accuracy and occasional min A   Status Achieved     SLP SHORT TERM GOAL #2   Title Pt will complete various naming tasks of increasing complexity with 90% success over 2 sessions, given mod assist   Status Achieved     SLP SHORT TERM GOAL #3   Title Pt will implement and use a system for functional recall of daily and scheduled activities with mod assistance over 2 sessions   Period Weeks   Status Achieved          SLP  Long Term Goals - 17-Jan-2017 1618      SLP LONG TERM GOAL #1   Title Pt will alternate attention between 2 mildly complex cognitive linguistic tasks with 85% on each and occasional min A   Time 4   Period Weeks   Status On-going     SLP LONG TERM GOAL #2   Title Pt will complete Level III (abstract) naming tasks with 90% accuracy without cues   Time 4   Period Weeks   Status On-going     SLP LONG TERM GOAL #3   Title Pt will report daily functional and effective use of compensatory strategies for recall of daily activities and appointments.    Time 4   Period Weeks     SLP LONG TERM GOAL #4   Title pt will tell SLP 4 memory strategies over two sessions with modified independence   Time 4   Period Weeks   Status On-going          Plan - 01/17/2017 1617    Clinical Impression  Statement Pt continues to display cognitive linguistic deficits in memory, attention and word-finding. Pt has been demonstrating good carryover of use of her planner, though she continues to require cuing for attention to details. Abstract naming tasks this session with 90% accuracy given extended time. Pt would cont to benefit from skilled ST to address cognitive linguistic and word finding deficits.   Speech Therapy Frequency 2x / week   Treatment/Interventions Language facilitation;Environmental controls;Cueing hierarchy;SLP instruction and feedback;Compensatory techniques;Cognitive reorganization;Functional tasks;Multimodal communcation approach;Patient/family education;Internal/external aids   Potential to Achieve Goals Good   SLP Home Exercise Plan research continuing education classes that fit her therapy/leisure schedule, naming tasks   Consulted and Agree with Plan of Care Patient      Patient will benefit from skilled therapeutic intervention in order to improve the following deficits and impairments:   Cognitive communication deficit      G-Codes - 01/17/17 1622    Functional Assessment Tool Used NOMS   Functional Limitations Attention   Attention Current Status (L7989) At least 20 percent but less than 40 percent impaired, limited or restricted   Attention Goal Status (Q1194) At least 1 percent but less than 20 percent impaired, limited or restricted      Problem List Patient Active Problem List   Diagnosis Date Noted  . Left leg weakness   . Cerebrovascular accident (CVA) ([place])   . Hyperlipidemia   . OSA (obstructive sleep apnea)   . Left-sided weakness 10/11/2016  . Orthostatic hypotension 09/18/2015  . Chest pain 08/15/2015  . Acute sinusitis 08/19/2014  . Yeast infection 08/19/2014  . Vitamin D deficiency 05/20/2014  . Gastro-esophageal reflux 03/20/2014  . Allergic rhinitis 07/09/2013  . Sleep disorder 07/09/2013  . Elevated blood pressure (not hypertension)  07/09/2013  . Mood disorder ([place]) 11/23/2011  . Diabetes mellitus ([place]) 11/23/2011  . Health care maintenance 11/23/2011   Speech Therapy Progress Note  Dates of Reporting Period: 11/18/16 to 2017-01-17  Subjective: Patient has attented 10 sessions targeting cognitive linguistic deficits in attention, memory and word-finding.  Objective Measurements: 90% accuracy today for abstract naming tasks with extended time. Maintains sustained, alternating attention in a quiet environment, continues to require external cues and extended time in distracting environment.   Goal Update: Pt has met 4/4 short term goals and is making steady progress toward LTGs.  Plan: Continue with current plan of care  Reason Skilled Services are Required: Skilled ST recommended to address  the above named impairments in order to maximize cognitive functioning and communication, improve quality of life.  Deneise Lever, [place], CCC-SLP Speech-Language Pathologist  Aliene Altes 12/21/2016, 4:24 PM  [facility] [address] [place], [place], [address] Phone: [phone]   Fax:  [phone]   Name: Wendy Mcclain MRN: [id number] Date of Birth: 02/13/1951

## 2016-12-21 NOTE — Therapy (Signed)
[facility] [address] [place], [place], [address] Phone: [phone]   Fax:  [phone]  Physical Therapy Treatment  Patient Details  Name: Wendy Mcclain MRN: [id number] Date of Birth: [birth year] Referring Provider: Ronnie Doss DO  Talbert Cage MD)  Encounter Date: 12/21/2016      PT End of Session - 12/21/16 1235    Visit Number 11   Number of Visits 17   Date for PT Re-Evaluation 01/14/17   Authorization Type Medicare & G-codes    PT Start Time Oct 09, 1231   PT Stop Time 1315   PT Time Calculation (min) 42 min   Equipment Utilized During Treatment Gait belt   Activity Tolerance Patient tolerated treatment well   Behavior During Therapy WFL for tasks assessed/performed      Past Medical History:  Diagnosis Date  . Arrhythmia   . Asthma   . Diabetes mellitus   . GERD (gastroesophageal reflux disease)     Past Surgical History:  Procedure Laterality Date  . ABDOMINAL HYSTERECTOMY  1993  . APPENDECTOMY  1963  . BREAST ENHANCEMENT SURGERY  1986    Vitals:   12/21/16 1233  BP: 118/68        Subjective Assessment - 12/21/16 1233    Subjective Still having headaches, more intense at times. Has called MD and waiting for a call back. No falls.    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 Yes   Pain Score 6    Pain Location Head   Pain Descriptors / Indicators Headache   Pain Type Acute pain   Pain Frequency Constant   Aggravating Factors  unknown   Pain Relieving Factors tylenol           [facility] Adult PT Treatment/Exercise - 12/21/16 1240      Transfers   Transfers  Sit to Stand;Stand to Sit   Sit to Stand 6: Modified independent (Device/Increase time);With upper extremity assist;Without upper extremity assist;From bed;From chair/3-in-1   Stand to Sit 6: Modified independent (Device/Increase time);Without upper extremity assist;With upper extremity assist;To bed;To chair/3-in-1     Ambulation/Gait   Ambulation/Gait Yes   Ambulation Distance (Feet) 1000 Feet   Assistive device None   Gait Pattern Step-through pattern;Decreased arm swing - left;Narrow base of support;Decreased trunk rotation   Ambulation Surface Level;Unlevel;Indoor;Outdoor;Paved;Gravel;Grass   Stairs Yes   Stairs Assistance 6: Modified independent (Device/Increase time)   Stair Management Technique No rails;One rail Right;Alternating pattern;Forwards   Number of Stairs 4  x 2 reps   Door Management 6: Modified independent (Device/Increase time)   Ramp 6: Modified independent (Device)   Curb 6: Modified independent (Device/increase time)     High Level Balance   High Level Balance Activities Marching forwards;Marching backwards;Tandem walking  tandem fwd/bwd, toe walk fwd/bwd, heel walk fwd/bwd   High Level Balance Comments on red mats next to counter top with no to light UE support for balance: performed each for 3 laps with min guard assist and cues on posture/ex form.              Balance Exercises - 12/21/16 1304      Balance Exercises: Standing   SLS with  Vectors Foam/compliant surface;Other reps (comment);Limitations     Balance Exercises: Standing   SLS with Vectors Limitations 6 cones along edge of both red mats: alternating toe taps to each with side stepping left<>right x 1 lap each way, progressing to alternating double toe taps toe each cone with side stepping left<>right x 1 lap each way, all with min guard to min assist for balance, cues on posture and weight shifting, no UE support; 5 cones down center of both red mats: foward toe tapping each foot with weaving  around cones afterwards, alternating sides. pt performed 2 laps forward with min guard to min assist, cues on ex form, posture and weight shifting to assist balance.                                       PT Short Term Goals - 12/16/16 1537      PT SHORT TERM GOAL #1   Title Patient will verbalize understanding of initial HEP to decrease her risk of falling. (TARGET DATE: 12/17/2016)   Baseline Met; 12/16/16.   Time 4   Period Weeks   Status Achieved     PT SHORT TERM GOAL #2   Title Patient will achieve a cognitive TUG score of < or = 18 seconds in order to decrease her risk of falling. (TARGET DATE: 12/17/2016)   Baseline Met cognitive TUG 15.3 sec; 12/16/16.   Time 4   Period Weeks   Status Achieved     PT SHORT TERM GOAL #3   Title Patient will demonstrate an ability to ambulate 500 feet with supervision and LRAD to indicate a decrease in her risk of falling. (TARGET DATE: 12/17/2016)   Baseline MET 12/14/2016    Time 4   Period Weeks   Status Achieved           PT Long Term Goals - 12/21/16 1239      PT LONG TERM GOAL #1   Title Patient will verbalize understanidng of HEP and ongoing fitness program. (TARGET DATE: 01/14/2017)    Baseline 12/21/16: pt independent with current HEP and has plans to join [employer] sne[employer] for community fitness   Time --   Period --   Status Achieved     PT LONG TERM GOAL #2   Title Patient will achieve a score of >or = 45 to demonstrate a decrease in her risk of falling. (TARGET DATE: 01/14/2017)   Time 8   Period Weeks   Status On-going     PT LONG TERM GOAL #3   Title Patient will demonstrate ability to ambulate 1000 feet without AD including unlevel surfaces (grass, curbs, ramps) with supervision to indicate a return to patient's prior level of function. (TARGET DATE: 01/14/2017)   Baseline 12/21/16: met today   Time --   Period --   Status Achieved     PT LONG TERM GOAL #4   Title Patient will acheive a functional gait assessment  score of > or = 19 to indicate a decrease in her risk of falling. (TARGET DATE: 01/14/2017)   Baseline 12/21/16: met at last session with score of 23/30   Status Achieved     PT LONG TERM GOAL #5   Title Patient will achieve a cognitive TUG score of < or = 15.5 seconds to indicate a decrease in her risk of falling. (TARGET DATE: 01/14/2017)   Baseline 12/21/16: met at  last visit with score of 15.3 sec's with no AD   Status Achieved               Plan - 12/21/16 1235    Clinical Impression Statement Pt has met all but 1 LTG to date. Will plan to check progress toward this goal by performing Berg Balance Test at next session with antcipation that pt will meet goals for early discharge. Primary PT aware and in agreement with this. Pt is also in agreement with this plan.    Rehab Potential Good   Clinical Impairments Affecting Rehab Potential chest pain with exertion, type 2 DM, allergic rhinitis, GERD, arrhythmia    PT Frequency 2x / week   PT Duration 8 weeks   PT Treatment/Interventions ADLs/Self Care Home Management;Electrical Stimulation;DME Instruction;Gait training;Stair training;Functional mobility training;Therapeutic activities;Therapeutic exercise;Balance training;Patient/family education;Neuromuscular re-education;Manual techniques   PT Next Visit Plan check berg LTG with anticipation for early discharge at that (next ) visit   Consulted and Agree with Plan of Care Patient      Patient will benefit from skilled therapeutic intervention in order to improve the following deficits and impairments:  Abnormal gait, Decreased activity tolerance, Decreased balance, Decreased knowledge of precautions, Decreased endurance, Decreased knowledge of use of DME, Decreased mobility, Decreased range of motion, Decreased safety awareness, Difficulty walking, Decreased strength, Impaired sensation, Impaired tone, Postural dysfunction, Pain  Visit Diagnosis: Muscle weakness (generalized)  Other  abnormalities of gait and mobility  Unsteadiness on feet  Hemiplegia and hemiparesis following cerebral infarction affecting left non-dominant side (HCC)  Other lack of coordination  History of falling     Problem List Patient Active Problem List   Diagnosis Date Noted  . Left leg weakness   . Cerebrovascular accident (CVA) ([place])   . Hyperlipidemia   . OSA (obstructive sleep apnea)   . Left-sided weakness 10/11/2016  . Orthostatic hypotension 09/18/2015  . Chest pain 08/15/2015  . Acute sinusitis 08/19/2014  . Yeast infection 08/19/2014  . Vitamin D deficiency 05/20/2014  . Gastro-esophageal reflux 03/20/2014  . Allergic rhinitis 07/09/2013  . Sleep disorder 07/09/2013  . Elevated blood pressure (not hypertension) 07/09/2013  . Mood disorder ([place]) 11/23/2011  . Diabetes mellitus ([place]) 11/23/2011  . Health care maintenance 11/23/2011    Willow Ora, PTA, [facility] [address], [place], [place] [address] [phone] 12/21/16, 3:24 PM   Name: ONIE KASPAREK MRN: [id number] Date of Birth: September 22, 1950

## 2016-12-21 NOTE — Telephone Encounter (Signed)
Patient called office in reference to worsening headaches over the past week.  Patient continues to do therapy with no relief and taking 2-3 tylenol per day.  Please call

## 2016-12-23 ENCOUNTER — Encounter: Payer: Self-pay | Admitting: Occupational Therapy

## 2016-12-23 ENCOUNTER — Ambulatory Visit: Payer: Medicare Other

## 2016-12-23 ENCOUNTER — Encounter: Payer: Self-pay | Admitting: Physical Therapy

## 2016-12-23 ENCOUNTER — Ambulatory Visit: Payer: Medicare Other | Admitting: Physical Therapy

## 2016-12-23 ENCOUNTER — Ambulatory Visit: Payer: Medicare Other | Admitting: Occupational Therapy

## 2016-12-23 DIAGNOSIS — R4701 Aphasia: Secondary | ICD-10-CM

## 2016-12-23 DIAGNOSIS — I69318 Other symptoms and signs involving cognitive functions following cerebral infarction: Secondary | ICD-10-CM

## 2016-12-23 DIAGNOSIS — R2689 Other abnormalities of gait and mobility: Secondary | ICD-10-CM

## 2016-12-23 DIAGNOSIS — R2681 Unsteadiness on feet: Secondary | ICD-10-CM

## 2016-12-23 DIAGNOSIS — I69354 Hemiplegia and hemiparesis following cerebral infarction affecting left non-dominant side: Secondary | ICD-10-CM

## 2016-12-23 DIAGNOSIS — R41842 Visuospatial deficit: Secondary | ICD-10-CM

## 2016-12-23 DIAGNOSIS — M6281 Muscle weakness (generalized): Secondary | ICD-10-CM | POA: Diagnosis not present

## 2016-12-23 DIAGNOSIS — R41841 Cognitive communication deficit: Secondary | ICD-10-CM

## 2016-12-23 DIAGNOSIS — R278 Other lack of coordination: Secondary | ICD-10-CM | POA: Diagnosis not present

## 2016-12-23 NOTE — Therapy (Signed)
[facility] [address] [place], [place], [address] Phone: [phone]   Fax:  [phone]  Occupational Therapy Treatment  Patient Details  Name: Wendy Mcclain MRN: [id number] Date of Birth: 1950-10-21 Referring Provider: Dr Ronnie Doss, DO  Encounter Date: 12/23/2016      OT End of Session - 12/23/16 1633    Visit Number 10   Number of Visits 17   Date for OT Re-Evaluation 01/18/17   Authorization Type Medicare - G code next visit   Authorization Time Period G code and progress note every 10th visit.   Authorization - Visit Number 10   Authorization - Number of Visits 20   OT Start Time 1402   OT Stop Time 1856   OT Time Calculation (min) 43 min   Activity Tolerance Patient tolerated treatment well      Past Medical History:  Diagnosis Date  . Arrhythmia   . Asthma   . Diabetes mellitus   . GERD (gastroesophageal reflux disease)     Past Surgical History:  Procedure Laterality Date  . ABDOMINAL HYSTERECTOMY  1993  . APPENDECTOMY  1963  . BREAST ENHANCEMENT SURGERY  1986    There were no vitals filed for this visit.      Subjective Assessment - 12/23/16 1406    Subjective  I graduated from PT today!   Pertinent History Chest pain with excertion; type 2 DM (insulin dependent); Allergic rhinitis; GERD; Arrhythmia. 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 Yes   Pain Score 1    Pain Location Arm   Pain Orientation Left   Pain Descriptors / Indicators Pins and needles   Pain Type Chronic pain   Pain Onset More than a month ago   Pain Frequency Intermittent   Aggravating Factors  malpositioning   Pain Relieving Factors repositioning   Effect of Pain on Daily  Activities limits functional reach                      OT Treatments/Exercises (OP) - 12/23/16 0001      ADLs   Overall ADLs Pt given information to follow up at the six month mark for neuro -opthamology. Pt reports that she still has a black spot in the vision of her L eye but is learning to work around this.     ADL Comments Checked LTG's - see goals section for updates.  Discussed progress with pt.  Pt stated she is very pleased with progress.  Completed MOCA with pt;  pt with score of 27/30.  Pt with some retrieval problems for ST memory. Pt is aware and utilizing several strategies such as using calendar, writing things down, putting things in the same place.  Pt reports she is doing very simple cooking at home (i.e. making sandwiches, making frozen pizza,making breakfast). Pt also able to tie shoes today and states she can do buttons with extra time.                   OT Short Term Goals - 12/23/16 1628      OT SHORT TERM GOAL #1   Title Pt will be Mod I HEP for coordination LUE (Target date: 12/16/16)   Time 4   Period  Weeks   Status Achieved     OT SHORT TERM GOAL #2   Title Pt will be Mod I strengthening HEP for LUE (Target date 12/16/16)   Time 4   Period Weeks   Status On-going  Pt is perfoming ball ex in supine, have not progressed to shoulder strengthening, putty for hand strength     OT SHORT TERM GOAL #3   Title Pt will be supervision simple functional reach mid level using L UE as assist to dominant RUE w/o LOB during simulated ADL's   Time 4   Period Weeks   Status Achieved           OT Long Term Goals - 12/23/16 1628      OT LONG TERM GOAL #1   Title Pt wil ldemonstrate improved coordination as seen by improved 9 hole peg test score left of 28 seconds or less & Mod I fastening buttons/fasteners when dressing.   Baseline 11/18/16: L = 34.81 seconds, R = 24.28 seconds; Min A fastening buttons, tying shoes etc.   Time 8   Period Weeks    Status Achieved  5/24/2018R= 20.63, L = 25.21     OT LONG TERM GOAL #2   Title Pt will demonstrate improved LUE grip strength as seen by JAMAR grip assessment L of 15# or greater   Baseline 11/18/16 L = 4# vs R = 29# via JAMAR grip position II   Time 8   Period Weeks   Status Achieved  12/23/2016 R= 48, L= 39     OT LONG TERM GOAL #3   Title Pt will be Mod I simple snack prep in ADL kitchen w/o LOB   Time 8   Period Weeks   Status Achieved     OT LONG TERM GOAL #4   Title Pt will be supervision simple bill pay, check writing   Time 8   Period Weeks   Status On-going     OT LONG TERM GOAL #5   Title Pt will be supervision memory strategies and cognitive compensatory strategies during ADL tasks   Time 8   Period Weeks   Status On-going     OT LONG TERM GOAL #6   Title Pt will be Mod I compensatory strategies due to visual changes during ADL/homemaking activities in ADL kitchen   Time 8   Period Weeks   Status On-going               Plan - 12/23/16 1632    Clinical Impression Statement Pt making excellent progresss toward goals. Pt states she is very pleased with progress and feels she is doing more and more at home.   Rehab Potential Good   OT Frequency 2x / week   OT Duration 8 weeks   OT Treatment/Interventions Self-care/ADL training;Therapeutic exercise;Cognitive remediation/compensation;Neuromuscular education;Visual/perceptual remediation/compensation;Energy conservation;Therapist, nutritional;Therapeutic exercises;Patient/family education;DME and/or AE instruction;Therapeutic activities   Plan functional overhead reach after scap mobs/faciliation, bill paying, visual perceptual skills   Consulted and Agree with Plan of Care Patient      Patient will benefit from skilled therapeutic intervention in order to improve the following deficits and impairments:  Decreased cognition, Decreased knowledge of use of DME, Pain, Decreased coordination, Decreased  mobility, Impaired sensation, Decreased strength, Decreased endurance, Decreased activity tolerance, Decreased range of motion, Decreased balance, Decreased knowledge of precautions, Decreased safety awareness, Impaired perceived functional ability  Visit Diagnosis: Muscle weakness (generalized)  Unsteadiness on feet  Hemiplegia and hemiparesis following cerebral infarction  affecting left non-dominant side (HCC)  Other lack of coordination  Visuospatial deficit  Other symptoms and signs involving cognitive functions following cerebral infarction      G-Codes - 01-06-17 1634    Functional Assessment Tool Used (Outpatient only) Clinical judgement; 9 hole peg test, grip assessment   Functional Limitation Carrying, moving and handling objects   Carrying, Moving and Handling Objects Current Status (I3128) At least 40 percent but less than 60 percent impaired, limited or restricted   Carrying, Moving and Handling Objects Goal Status (F1886) At least 20 percent but less than 40 percent impaired, limited or restricted      Problem List Patient Active Problem List   Diagnosis Date Noted  . Left leg weakness   . Cerebrovascular accident (CVA) ([place])   . Hyperlipidemia   . OSA (obstructive sleep apnea)   . Left-sided weakness 10/11/2016  . Orthostatic hypotension 09/18/2015  . Chest pain 08/15/2015  . Acute sinusitis 08/19/2014  . Yeast infection 08/19/2014  . Vitamin D deficiency 05/20/2014  . Gastro-esophageal reflux 03/20/2014  . Allergic rhinitis 07/09/2013  . Sleep disorder 07/09/2013  . Elevated blood pressure (not hypertension) 07/09/2013  . Mood disorder ([place]) 11/23/2011  . Diabetes mellitus ([place]) 11/23/2011  . Health care maintenance 11/23/2011    Quay Burow, OTR/L 2017-01-06, 4:34 PM  [facility] [address] [place], [place], [address] Phone: [phone]   Fax:  [phone]  Name: Wendy Mcclain MRN: [id number] Date of Birth: Jun 14, 1951

## 2016-12-23 NOTE — Therapy (Addendum)
[facility] [address] [place], [place], [address] Phone: [phone]   Fax:  [phone]  Physical Therapy Treatment  Patient Details  Name: Wendy Mcclain MRN: [id number] Date of Birth: June 01, 1951 Referring Provider: Ronnie Doss DO  Talbert Cage MD)  Encounter Date: 12/23/2016      PT End of Session - 12/23/16 1324    Visit Number 12   Number of Visits 17   Date for PT Re-Evaluation 01/14/17   Authorization Type Medicare & G-codes    PT Start Time 1320   PT Stop Time 9024  discharge visit, not all time was needed   PT Time Calculation (min) 28 min   Equipment Utilized During Treatment Gait belt   Activity Tolerance Patient tolerated treatment well   Behavior During Therapy [facility] for tasks assessed/performed      Past Medical History:  Diagnosis Date  . Arrhythmia   . Asthma   . Diabetes mellitus   . GERD (gastroesophageal reflux disease)     Past Surgical History:  Procedure Laterality Date  . ABDOMINAL HYSTERECTOMY  1993  . APPENDECTOMY  1963  . BREAST ENHANCEMENT SURGERY  1986    There were no vitals filed for this visit.      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 Yes   Pain Score 2    Pain Location Arm   Pain Orientation Left   Pain Descriptors / Indicators Aching;Numbness;Tingling   Pain Type Chronic pain   Pain Onset More than a month ago   Pain Frequency Intermittent   Aggravating Factors  malpositioning   Pain Relieving Factors repositioning   Pain Score 1   Pain Location Head   Pain Descriptors / Indicators  Headache   Pain Type Acute pain   Pain Onset In the past 7 days   Pain Frequency Intermittent   Aggravating Factors  unknown   Pain Relieving Factors unknown            [facility] PT Assessment - 12/23/16 1325      Berg Balance Test   Sit to Stand Able to stand without using hands and stabilize independently   Standing Unsupported Able to stand safely 2 minutes   Sitting with Back Unsupported but Feet Supported on Floor or Stool Able to sit safely and securely 2 minutes   Stand to Sit Sits safely with minimal use of hands   Transfers Able to transfer safely, minor use of hands   Standing Unsupported with Eyes Closed Able to stand 10 seconds safely   Standing Ubsupported with Feet Together Able to place feet together independently and stand 1 minute safely   From Standing, Reach Forward with Outstretched Arm Can reach forward >12 cm safely (5")  8 inches   From Standing Position, Pick up Object from Floor Able to pick up shoe safely and easily   From Standing Position, Turn to Look Behind Over each Shoulder Looks behind one side only/other side shows less weight shift  left> right   Turn 360 Degrees Able to turn 360 degrees safely in 4 seconds or less   Standing Unsupported, Alternately Place Feet  on Step/Stool Able to stand independently and safely and complete 8 steps in 20 seconds  9.26 sec's   Standing Unsupported, One Foot in [place] to place foot tandem independently and hold 30 seconds   Standing on One Leg Able to lift leg independently and hold > 10 seconds  12 sec's   Total Score 54   Berg comment: 54/56= lower risk of falling            [facility] Adult PT Treatment/Exercise - 12/23/16 1340      Self-Care   Other Self-Care Comments  community fitness: dicussed return to [facility], pt already looking into this. also continuing with HEP and walking program. Pt is up to 30 minutes 1x day with walking program. household activities: has reviewed lifting in previous session with  student PT/primary PT. Verbally reviewed  correct form/technique today. Also reviewed general body mechanics with other house hold activities as pt reports she is doing more at home.               PT Short Term Goals - 12/16/16 1537      PT SHORT TERM GOAL #1   Title Patient will verbalize understanding of initial HEP to decrease her risk of falling. (TARGET DATE: 12/17/2016)   Baseline Met; 12/16/16.   Time 4   Period Weeks   Status Achieved     PT SHORT TERM GOAL #2   Title Patient will achieve a cognitive TUG score of < or = 18 seconds in order to decrease her risk of falling. (TARGET DATE: 12/17/2016)   Baseline Met cognitive TUG 15.3 sec; 12/16/16.   Time 4   Period Weeks   Status Achieved     PT SHORT TERM GOAL #3   Title Patient will demonstrate an ability to ambulate 500 feet with supervision and LRAD to indicate a decrease in her risk of falling. (TARGET DATE: 12/17/2016)   Baseline MET 12/14/2016    Time 4   Period Weeks   Status Achieved           PT Long Term Goals - 12/23/16 1324      PT LONG TERM GOAL #1   Title Patient will verbalize understanidng of HEP and ongoing fitness program. (TARGET DATE: 01/14/2017)    Baseline 12/21/16: pt independent with current HEP and has plans to join [employer] for community fitness   Status Achieved     PT LONG TERM GOAL #2   Title Patient will achieve a score of >or = 45 to demonstrate a decrease in her risk of falling. (TARGET DATE: 01/14/2017)   Baseline 12/23/16: 54/56 scored today   Time --   Period --   Status Achieved     PT LONG TERM GOAL #3   Title Patient will demonstrate ability to ambulate 1000 feet without AD including unlevel surfaces (grass, curbs, ramps) with supervision to indicate a return to patient's prior level of function. (TARGET DATE: 01/14/2017)   Baseline 12/21/16: met today   Status Achieved     PT LONG TERM GOAL #4   Title Patient will acheive a functional gait assessment score of > or = 19  to indicate a decrease in her risk of falling. (TARGET DATE: 01/14/2017)   Baseline 12/21/16: met at last session with score of 23/30   Status Achieved     PT LONG TERM GOAL #5   Title Patient will achieve a cognitive TUG score of < or = 15.5 seconds to indicate a decrease  in her risk of falling. (TARGET DATE: 01/14/2017)   Baseline 12/21/16: met at last visit with score of 15.3 sec's with no AD   Status Achieved           Plan - 01-04-2017 1324    Clinical Impression Statement Pt met remaining LTG today with improved Berg Balance Test to 54/56. Pt has begun looking into returning to [facility] for community fitness. Pt agreeable to discharge today. Primary PT has agreed to early discharge with goals met as well.    Rehab Potential Good   Clinical Impairments Affecting Rehab Potential chest pain with exertion, type 2 DM, allergic rhinitis, GERD, arrhythmia    PT Frequency 2x / week   PT Duration 8 weeks   PT Treatment/Interventions ADLs/Self Care Home Management;Electrical Stimulation;DME Instruction;Gait training;Stair training;Functional mobility training;Therapeutic activities;Therapeutic exercise;Balance training;Patient/family education;Neuromuscular re-education;Manual techniques   PT Next Visit Plan discharge today with all goals met   Consulted and Agree with Plan of Care Patient      Patient will benefit from skilled therapeutic intervention in order to improve the following deficits and impairments:  Abnormal gait, Decreased activity tolerance, Decreased balance, Decreased knowledge of precautions, Decreased endurance, Decreased knowledge of use of DME, Decreased mobility, Decreased range of motion, Decreased safety awareness, Difficulty walking, Decreased strength, Impaired sensation, Impaired tone, Postural dysfunction, Pain  Visit Diagnosis: Muscle weakness (generalized)  Other abnormalities of gait and mobility  Unsteadiness on feet  Hemiplegia and hemiparesis following cerebral  infarction affecting left non-dominant side (HCC)       G-Codes - 2017/01/04 1608    Functional Assessment Tool Used (Outpatient Only) Functional Gait Assessment 23/30, Berg balance test 54/56   Functional Limitation Mobility: Walking and moving around      Problem List Patient Active Problem List   Diagnosis Date Noted  . Left leg weakness   . Cerebrovascular accident (CVA) ([place])   . Hyperlipidemia   . OSA (obstructive sleep apnea)   . Left-sided weakness 10/11/2016  . Orthostatic hypotension 09/18/2015  . Chest pain 08/15/2015  . Acute sinusitis 08/19/2014  . Yeast infection 08/19/2014  . Vitamin D deficiency 05/20/2014  . Gastro-esophageal reflux 03/20/2014  . Allergic rhinitis 07/09/2013  . Sleep disorder 07/09/2013  . Elevated blood pressure (not hypertension) 07/09/2013  . Mood disorder ([place]) 11/23/2011  . Diabetes mellitus ([place]) 11/23/2011  . Health care maintenance 11/23/2011    Willow Ora, PTA, [facility] [address] [place], [place] [address] [phone] 2017/01/04, 4:11 PM   Name: ASIAH BROWDER MRN: [id number] Date of Birth: 26-Mar-1951      G-Codes - Jan 04, 2017 09-21-1606    Functional Assessment Tool Used (Outpatient Only) Functional Gait Assessment 23/30, Berg balance test 54/56   Functional Limitation Mobility: Walking and moving around   Mobility: Walking and Moving Around Goal Status [phone]) At least 1 percent but less than 20 percent impaired, limited or restricted   Mobility: Walking and Moving Around Discharge Status [id number]) At least 1 percent but less than 20 percent impaired, limited or restricted     PHYSICAL THERAPY DISCHARGE SUMMARY  Visits from Start of Care: 12  Current functional level related to goals / functional outcomes: See above   Remaining deficits: See above   Education / Equipment: HEP & ongoing fitness plan.   Plan: Patient agrees to discharge.  Patient goals were met. Patient is being  discharged due to meeting the stated rehab goals.  ?????  Jamey Reas, PT, DPT PT Specializing in [place] 12/24/16 9:47 AM Phone:  [phone]  Fax:  [phone] [place], [place] [address]

## 2016-12-23 NOTE — Patient Instructions (Signed)
  Please complete the assigned speech therapy homework prior to your next session and return it to the speech therapist at your next visit.  

## 2016-12-23 NOTE — Therapy (Signed)
[facility] [address] [place], [place], [address] Phone: [phone]   Fax:  [phone]  Speech Language Pathology Treatment  Patient Details  Name: Wendy Mcclain MRN: [id number] Date of Birth: [birth year] Referring Provider: Talbert Cage MD Encounter Date: 12/23/2016      End of Session - 12/23/16 1539    Visit Number 11   Number of Visits 17   Date for SLP Re-Evaluation 01/14/17   SLP Start Time 1445   SLP Stop Time  1528   SLP Time Calculation (min) 43 min   Activity Tolerance Patient tolerated treatment well      Past Medical History:  Diagnosis Date  . Arrhythmia   . Asthma   . Diabetes mellitus   . GERD (gastroesophageal reflux disease)     Past Surgical History:  Procedure Laterality Date  . ABDOMINAL HYSTERECTOMY  1993  . APPENDECTOMY  1963  . BREAST ENHANCEMENT SURGERY  1986    There were no vitals filed for this visit.             ADULT SLP TREATMENT - 12/23/16 1451      General Information   Behavior/Cognition Alert;Pleasant mood;Cooperative     Treatment Provided   Treatment provided Cognitive-Linquistic     Pain Assessment   Pain Assessment 0-10   Pain Score 1    Pain Location --  lt side   Pain Descriptors / Indicators Numbness;Tingling   Pain Intervention(s) Monitored during session     Cognitive-Linquistic Treatment   Treatment focused on Cognition   Skilled Treatment Reviewed pt's homework as she did not complete it entirely. She req'd mod-max cues for divergent naming in mod complex categories with initial letter provided. In simple alternating attention task (written) pt performed with 100% accuracy.      Assessment / Recommendations / Plan   Plan Continue with current plan of care     Progression Toward Goals   Progression toward goals Progressing toward goals          SLP Education - 12/23/16 1536    Education provided --          SLP  Short Term Goals - 12/23/16 1540      SLP SHORT TERM GOAL #1   Title Pt will attend to details in mildly complex cognitive linguistic tasks with 85% accuracy and occasional min A   Status Achieved     SLP SHORT TERM GOAL #2   Title Pt will complete various naming tasks of increasing complexity with 90% success over 2 sessions, given mod assist   Status Achieved     SLP SHORT TERM GOAL #3   Title Pt will implement and use a system for functional recall of daily and scheduled activities with mod assistance over 2 sessions   Period Weeks   Status Achieved          SLP Long Term Goals - 12/23/16 [place] #1   Title Pt will alternate attention between 2 mildly complex cognitive linguistic tasks with 85% on each and occasional min A   Time --   Period --   Status Achieved     SLP LONG TERM GOAL #2   Title Pt will complete Level III (abstract) naming tasks with 90% accuracy without cues   Time 4   Period Weeks   Status On-going     SLP LONG TERM GOAL #3   Title Pt  will report daily functional and effective use of compensatory strategies for recall of daily activities and appointments.    Time 4   Period Weeks   Status On-going     SLP LONG TERM GOAL #4   Title pt will tell SLP 4 memory strategies over two sessions with modified independence   Time 4   Period Weeks   Status On-going          Plan - 12/23/16 1540    Clinical Impression Statement Pt continues to display cognitive linguistic deficits in memory, attention and word-finding. Pt has been demonstrating good carryover of use of her planner, though she continues to require cuing for attention to details. Abstract naming tasks were challenging to pt, even given extended time adn req'd SLP A. Goal met for simple linguistic alternating attention.  Pt would cont to benefit from skilled ST to address cognitive linguistic and word finding deficits.   Speech Therapy Frequency 2x / week   Duration --  8  weeks   Treatment/Interventions Language facilitation;Environmental controls;Cueing hierarchy;SLP instruction and feedback;Compensatory techniques;Cognitive reorganization;Functional tasks;Multimodal communcation approach;Patient/family education;Internal/external aids   Potential to Achieve Goals Good      Patient will benefit from skilled therapeutic intervention in order to improve the following deficits and impairments:   Cognitive communication deficit  Aphasia    Problem List Patient Active Problem List   Diagnosis Date Noted  . Left leg weakness   . Cerebrovascular accident (CVA) ([place])   . Hyperlipidemia   . OSA (obstructive sleep apnea)   . Left-sided weakness 10/11/2016  . Orthostatic hypotension 09/18/2015  . Chest pain 08/15/2015  . Acute sinusitis 08/19/2014  . Yeast infection 08/19/2014  . Vitamin D deficiency 05/20/2014  . Gastro-esophageal reflux 03/20/2014  . Allergic rhinitis 07/09/2013  . Sleep disorder 07/09/2013  . Elevated blood pressure (not hypertension) 07/09/2013  . Mood disorder ([place]) 11/23/2011  . Diabetes mellitus ([place]) 11/23/2011  . Health care maintenance 11/23/2011    [facility] ,MS, CCC-SLP  12/23/2016, 5:02 PM  [facility] [address] [place], [place], [address] Phone: [phone]   Fax:  [phone]   Name: Wendy Mcclain MRN: [id number] Date of Birth: June 08, 1951

## 2016-12-29 ENCOUNTER — Ambulatory Visit: Payer: Medicare Other | Admitting: Occupational Therapy

## 2016-12-29 ENCOUNTER — Ambulatory Visit: Payer: Medicare Other

## 2016-12-29 ENCOUNTER — Ambulatory Visit: Payer: Self-pay | Admitting: Nurse Practitioner

## 2016-12-29 ENCOUNTER — Ambulatory Visit: Payer: Medicare Other | Admitting: Physical Therapy

## 2016-12-29 DIAGNOSIS — I69354 Hemiplegia and hemiparesis following cerebral infarction affecting left non-dominant side: Secondary | ICD-10-CM | POA: Diagnosis not present

## 2016-12-29 DIAGNOSIS — R2689 Other abnormalities of gait and mobility: Secondary | ICD-10-CM | POA: Diagnosis not present

## 2016-12-29 DIAGNOSIS — R278 Other lack of coordination: Secondary | ICD-10-CM | POA: Diagnosis not present

## 2016-12-29 DIAGNOSIS — M6281 Muscle weakness (generalized): Secondary | ICD-10-CM | POA: Diagnosis not present

## 2016-12-29 DIAGNOSIS — R41842 Visuospatial deficit: Secondary | ICD-10-CM

## 2016-12-29 DIAGNOSIS — R2681 Unsteadiness on feet: Secondary | ICD-10-CM | POA: Diagnosis not present

## 2016-12-29 DIAGNOSIS — R4701 Aphasia: Secondary | ICD-10-CM

## 2016-12-29 DIAGNOSIS — R41841 Cognitive communication deficit: Secondary | ICD-10-CM

## 2016-12-29 NOTE — Therapy (Signed)
[facility] [address] [place], [place], [address] Phone: [phone]   Fax:  [phone]  Speech Language Pathology Treatment  Patient Details  Name: Wendy Mcclain MRN: [id number] Date of Birth: 1951/05/19 Referring Provider: Janora Norlander, DO  Encounter Date: 12/29/2016      End of Session - 12/29/16 1722    Visit Number 12   Number of Visits 17   Date for SLP Re-Evaluation 01/14/17   SLP Start Time 0936  5 minutes late   SLP Stop Time  47   SLP Time Calculation (min) 39 min   Activity Tolerance Patient tolerated treatment well      Past Medical History:  Diagnosis Date  . Arrhythmia   . Asthma   . Diabetes mellitus   . GERD (gastroesophageal reflux disease)     Past Surgical History:  Procedure Laterality Date  . ABDOMINAL HYSTERECTOMY  1993  . APPENDECTOMY  1963  . BREAST ENHANCEMENT SURGERY  1986    There were no vitals filed for this visit.      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   Currently in Pain? No/denies               ADULT SLP TREATMENT - 12/29/16 0954      General Information   Behavior/Cognition Alert;Pleasant mood;Cooperative     Treatment Provided   Treatment provided Cognitive-Linquistic     Cognitive-Linquistic Treatment   Treatment focused on Cognition   Skilled Treatment Pt's homework was complete, and she provided it to SLP at beginning of session. Pt wihtout anomia in 15 minute conversation (mod complex).  In mod complex/complex naming task, pt req'd min A rarely "(once/11 stimuli). Pt req'd analogy answer be provided (total A) x1/11, and req'd max A 2/11.      Assessment / Recommendations / Plan   Plan Continue with current plan of care     Progression Toward Goals   Progression toward goals Progressing toward goals            SLP Short Term Goals - 12/23/16  1540      SLP SHORT TERM GOAL #1   Title Pt will attend to details in mildly complex cognitive linguistic tasks with 85% accuracy and occasional min A   Status Achieved     SLP SHORT TERM GOAL #2   Title Pt will complete various naming tasks of increasing complexity with 90% success over 2 sessions, given mod assist   Status Achieved     SLP SHORT TERM GOAL #3   Title Pt will implement and use a system for functional recall of daily and scheduled activities with mod assistance over 2 sessions   Period Weeks   Status Achieved          SLP Long Term Goals - 12/29/16 2637      SLP LONG TERM GOAL #1   Title Pt will alternate attention between 2 mildly complex cognitive linguistic tasks with 85% on each and occasional min A   Status Achieved     SLP LONG TERM GOAL #2   Title Pt will complete Level III (abstract) naming tasks with 90% accuracy without cues   Time 4   Period Weeks   Status On-going     SLP LONG TERM GOAL #3   Title Pt will report daily functional and effective use of compensatory strategies  for recall of daily activities and appointments.    Status Achieved     SLP LONG TERM GOAL #4   Title pt will tell SLP 4 memory strategies over two sessions with modified independence   Time 4   Period Weeks   Status On-going          Plan - 12/29/16 1722    Clinical Impression Statement Pt continues to display mild cognitive linguistic deficits in memory, attention and word-finding, however these deficits are improving. Pt completed checkbook task for today's homework with 100% accuracy Pt has been demonstrating good carryover of use of her planner. Abstract naming tasks continue as challenging to pt, even given extended time adn req'd SLP A. Pt would cont to benefit from skilled ST to address cognitive linguistic and word finding deficits.   Speech Therapy Frequency 2x / week   Duration --  8 weeks   Treatment/Interventions Language facilitation;Environmental  controls;Cueing hierarchy;SLP instruction and feedback;Compensatory techniques;Cognitive reorganization;Functional tasks;Multimodal communcation approach;Patient/family education;Internal/external aids   Potential to Achieve Goals Good      Patient will benefit from skilled therapeutic intervention in order to improve the following deficits and impairments:   Cognitive communication deficit  Aphasia    Problem List Patient Active Problem List   Diagnosis Date Noted  . Left leg weakness   . Cerebrovascular accident (CVA) ([place])   . Hyperlipidemia   . OSA (obstructive sleep apnea)   . Left-sided weakness 10/11/2016  . Orthostatic hypotension 09/18/2015  . Chest pain 08/15/2015  . Acute sinusitis 08/19/2014  . Yeast infection 08/19/2014  . Vitamin D deficiency 05/20/2014  . Gastro-esophageal reflux 03/20/2014  . Allergic rhinitis 07/09/2013  . Sleep disorder 07/09/2013  . Elevated blood pressure (not hypertension) 07/09/2013  . Mood disorder ([place]) 11/23/2011  . Diabetes mellitus ([place]) 11/23/2011  . Health care maintenance 11/23/2011    [facility] ,[place], [place]  12/29/2016, 5:24 PM  [facility] [address] [place], [place], [address] Phone: [phone]   Fax:  [phone]   Name: Wendy Mcclain MRN: [id number] Date of Birth: 1950/08/29

## 2016-12-29 NOTE — Therapy (Signed)
[facility] [address] [place], [place], [address] Phone: [phone]   Fax:  [phone]  Occupational Therapy Treatment  Patient Details  Name: Wendy Mcclain MRN: [id number] Date of Birth: 12/21/1950 Referring Provider: Dr Ronnie Doss, DO  Encounter Date: 12/29/2016      OT End of Session - 12/29/16 1050    Visit Number 11   Number of Visits 17   Date for OT Re-Evaluation 01/18/17   Authorization Type Medicare -    Authorization - Visit Number 11   Authorization - Number of Visits 20   OT Start Time 1020   OT Stop Time 1100   OT Time Calculation (min) 40 min   Activity Tolerance Patient tolerated treatment well   Behavior During Therapy [facility] for tasks assessed/performed      Past Medical History:  Diagnosis Date  . Arrhythmia   . Asthma   . Diabetes mellitus   . GERD (gastroesophageal reflux disease)     Past Surgical History:  Procedure Laterality Date  . ABDOMINAL HYSTERECTOMY  1993  . APPENDECTOMY  1963  . BREAST ENHANCEMENT SURGERY  1986    There were no vitals filed for this visit.      Subjective Assessment - 12/29/16 1049    Subjective  Pt reports she is doing well   Pertinent History Chest pain with excertion; type 2 DM (insulin dependent); Allergic rhinitis; GERD; Arrhythmia. 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 No/denies            Treatment:Supine on mat gentle left shoulder joint and scapular mobs followed by AA/ROM closed chain shoulder flexion and unilateral shoulder flexion, min-mod facilitation.  Prone scapular retraction with bilateral UE extension x 10 reps, min v.c., min facilitation Prone on elbows sliding alternate arm forwards and  back wards for UE strength, core stability Seated closed chain mid-high range shoulder flexion with PVC pipe frame, min facilitation at left scapula followed by functional reaching with  LUE to copy small peg design, at high range min v.c for postioning and to avoid compensation, removing pegs with in hand manipulation only min difficulty . Pt was able to complete design correctly without v.c                         OT Short Term Goals - 12/23/16 1628      OT SHORT TERM GOAL #1   Title Pt will be Mod I HEP for coordination LUE (Target date: 12/16/16)   Time 4   Period Weeks   Status Achieved     OT SHORT TERM GOAL #2   Title Pt will be Mod I strengthening HEP for LUE (Target date 12/16/16)   Time 4   Period Weeks   Status On-going  Pt is perfoming ball ex in supine, have not progressed to shoulder strengthening, putty for hand strength     OT SHORT TERM GOAL #3   Title Pt will be supervision simple functional reach mid level using L UE as assist to dominant RUE w/o LOB during simulated ADL's   Time 4   Period Weeks   Status Achieved           OT Long Term Goals - 12/23/16 9242  OT LONG TERM GOAL #1   Title Pt wil ldemonstrate improved coordination as seen by improved 9 hole peg test score left of 28 seconds or less & Mod I fastening buttons/fasteners when dressing.   Baseline 11/18/16: L = 34.81 seconds, R = 24.28 seconds; Min A fastening buttons, tying shoes etc.   Time 8   Period Weeks   Status Achieved  5/24/2018R= 20.63, L = 25.21     OT LONG TERM GOAL #2   Title Pt will demonstrate improved LUE grip strength as seen by JAMAR grip assessment L of 15# or greater   Baseline 11/18/16 L = 4# vs R = 29# via JAMAR grip position II   Time 8   Period Weeks   Status Achieved  12/23/2016 R= 48, L= 39     OT LONG TERM GOAL #3   Title Pt will be Mod I simple snack prep in ADL kitchen w/o LOB   Time 8   Period Weeks   Status Achieved     OT LONG TERM  GOAL #4   Title Pt will be supervision simple bill pay, check writing   Time 8   Period Weeks   Status On-going     OT LONG TERM GOAL #5   Title Pt will be supervision memory strategies and cognitive compensatory strategies during ADL tasks   Time 8   Period Weeks   Status On-going     OT LONG TERM GOAL #6   Title Pt will be Mod I compensatory strategies due to visual changes during ADL/homemaking activities in ADL kitchen   Time 8   Period Weeks   Status On-going               Plan - 12/29/16 1052    Clinical Impression Statement Pt demonstrates significant improvements in functional overhead reach.   Rehab Potential Good   OT Frequency 2x / week   OT Duration 8 weeks   OT Treatment/Interventions Self-care/ADL training;Therapeutic exercise;Cognitive remediation/compensation;Neuromuscular education;Visual/perceptual remediation/compensation;Energy conservation;Therapist, nutritional;Therapeutic exercises;Patient/family education;DME and/or AE instruction;Therapeutic activities   OT Home Exercise Plan issued coordination, ball ex supine   Consulted and Agree with Plan of Care Patient   Plan continue to address overhead reach, visual perceptual skills      Patient will benefit from skilled therapeutic intervention in order to improve the following deficits and impairments:  Decreased cognition, Decreased knowledge of use of DME, Pain, Decreased coordination, Decreased mobility, Impaired sensation, Decreased strength, Decreased endurance, Decreased activity tolerance, Decreased range of motion, Decreased balance, Decreased knowledge of precautions, Decreased safety awareness, Impaired perceived functional ability  Visit Diagnosis: Muscle weakness (generalized)  Other abnormalities of gait and mobility  Unsteadiness on feet  Hemiplegia and hemiparesis following cerebral infarction affecting left non-dominant side (HCC)  Other lack of coordination  Visuospatial  deficit    Problem List Patient Active Problem List   Diagnosis Date Noted  . Left leg weakness   . Cerebrovascular accident (CVA) ([place])   . Hyperlipidemia   . OSA (obstructive sleep apnea)   . Left-sided weakness 10/11/2016  . Orthostatic hypotension 09/18/2015  . Chest pain 08/15/2015  . Acute sinusitis 08/19/2014  . Yeast infection 08/19/2014  . Vitamin D deficiency 05/20/2014  . Gastro-esophageal reflux 03/20/2014  . Allergic rhinitis 07/09/2013  . Sleep disorder 07/09/2013  . Elevated blood pressure (not hypertension) 07/09/2013  . Mood disorder ([place]) 11/23/2011  . Diabetes mellitus ([place]) 11/23/2011  . Health care maintenance 11/23/2011    Jeorge Reister 12/29/2016,  11:02 AM  [facility] [address] [place], [place], [address] Phone: [phone]   Fax:  [phone]  Name: Wendy Mcclain MRN: [id number] Date of Birth: [birth year]

## 2016-12-31 ENCOUNTER — Ambulatory Visit: Payer: Medicare Other | Attending: Family Medicine | Admitting: Occupational Therapy

## 2016-12-31 ENCOUNTER — Ambulatory Visit: Payer: Medicare Other | Admitting: *Deleted

## 2016-12-31 ENCOUNTER — Ambulatory Visit: Payer: Medicare Other | Admitting: Physical Therapy

## 2016-12-31 DIAGNOSIS — I69318 Other symptoms and signs involving cognitive functions following cerebral infarction: Secondary | ICD-10-CM | POA: Diagnosis not present

## 2016-12-31 DIAGNOSIS — I69354 Hemiplegia and hemiparesis following cerebral infarction affecting left non-dominant side: Secondary | ICD-10-CM | POA: Insufficient documentation

## 2016-12-31 DIAGNOSIS — R4701 Aphasia: Secondary | ICD-10-CM | POA: Insufficient documentation

## 2016-12-31 DIAGNOSIS — R41841 Cognitive communication deficit: Secondary | ICD-10-CM | POA: Diagnosis not present

## 2016-12-31 DIAGNOSIS — M6281 Muscle weakness (generalized): Secondary | ICD-10-CM | POA: Diagnosis not present

## 2016-12-31 DIAGNOSIS — R278 Other lack of coordination: Secondary | ICD-10-CM | POA: Diagnosis not present

## 2016-12-31 DIAGNOSIS — R41842 Visuospatial deficit: Secondary | ICD-10-CM

## 2016-12-31 NOTE — Patient Instructions (Addendum)
Memory Compensation Strategies  1. Use "WARM" strategy.  W= write it down  A= associate it  R= repeat it  M= make a mental note  2.   You can keep a Social worker.  Use a 3-ring notebook with sections for the following: calendar, important names and phone numbers,  medications, doctors' names/phone numbers, lists/reminders, and a section to journal what you did  each day.   3.    Use a calendar to write appointments down.  4.    Write yourself a schedule for the day.  This can be placed on the calendar or in a separate section of the Memory Notebook.  Keeping a  regular schedule can help memory.  5.    Use medication organizer with sections for each day or morning/evening pills.  You may need help loading it  6.    Keep a basket, or pegboard by the door.  Place items that you need to take out with you in the basket or on the pegboard.  You may also want to  include a message board for reminders.  7.    Use sticky notes.  Place sticky notes with reminders in a place where the task is performed.  For example: " turn off the  stove" placed by the stove, "lock the door" placed on the door at eye level, " take your medications" on  the bathroom mirror or by the place where you normally take your medications.  8.    Use alarms/timers.  Use while cooking to remind yourself to check on food or as a reminder to take your medicine, or as a  reminder to make a call, or as a reminder to perform another task, etc.       Vision strategies-  1. Look for the edge of objects (to the left and/or right) so that you make sure you are seeing all of an object 2. Turn your head when walking, scan from side to side, particularly in busy environments 3. Use an organized scanning pattern. It's usually easier to scan from top to bottom, and left to right (like you are reading) 4. Double check yourself 5. Use a line guide (like a blank piece of paper) or your finger when reading    Strengthening:  Resisted Flexion   Hold tubing with __left___ arm(s) at side. Pull forward and up. Move shoulder through pain-free range of motion. Repeat __10__ times per set.  Do _1-2_ sessions per day , every other day   Strengthening: Resisted Extension   Hold tubing in __left___ hand(s), arm forward. Pull arm back, elbow straight. Repeat _10___ times per set. Do _1-2___ sessions per day, every other day.   Resisted Horizontal Abduction: Bilateral- hold hands lower   Sit or stand, tubing in both hands, arms out in front. Keeping arms straight, pinch shoulder blades together and stretch arms out. Repeat _10___ times per set. Do _1-2___ sessions per day, every other day.        Elbow Extension: Resisted- triceps extension, straighten left elbow   Sit in chair with resistive band in both hands and _left______ elbow bent. Straighten elbow. Repeat _10___ times per set.  Do _1-2___ sessions per day, every other day.   Copyright  VHI. All rights reserved.

## 2016-12-31 NOTE — Therapy (Signed)
[facility] [address] [place], [place], [address] Phone: [phone]   Fax:  [phone]  Speech Language Pathology Treatment  Patient Details  Name: Wendy Mcclain MRN: [id number] Date of Birth: [birth year] Referring Provider: Janora Norlander, DO  Encounter Date: 12/31/2016      End of Session - 12/31/16 1245    Visit Number 13   Number of Visits 17   Date for SLP Re-Evaluation 01/14/17   SLP Start Time 1105   SLP Stop Time  1145   SLP Time Calculation (min) 40 min   Activity Tolerance Patient tolerated treatment well      Past Medical History:  Diagnosis Date  . Arrhythmia   . Asthma   . Diabetes mellitus   . GERD (gastroesophageal reflux disease)     Past Surgical History:  Procedure Laterality Date  . ABDOMINAL HYSTERECTOMY  1993  . APPENDECTOMY  1963  . BREAST ENHANCEMENT SURGERY  1986    There were no vitals filed for this visit.      Subjective Assessment - 12/31/16 1106    Subjective I didn't get to the worksheets because we had to drive to the beach and back.   Currently in Pain? No/denies               ADULT SLP TREATMENT - 12/31/16 0001      General Information   Behavior/Cognition Alert;Cooperative;Pleasant mood     Treatment Provided   Treatment provided Cognitive-Linquistic     Pain Assessment   Pain Assessment No/denies pain     Cognitive-Linquistic Treatment   Treatment focused on Cognition;Patient/family/caregiver education   Skilled Treatment Pt reports not completing homework due to trip to the beach. SLP clarified instructions (describe with attributes). SLP and Pt engaged in conversation for 15+ minutes, during which time there was only one error of word finding. Pt named 4 memory strategies - use of calendar, make mental notes, read carefully, and conversation to get all the details. Pt verbalized awareness that she "can't depend on recall".      Assessment /  Recommendations / Plan   Plan Continue with current plan of care     Progression Toward Goals   Progression toward goals Progressing toward goals          SLP Education - 12/31/16 1244    Education provided Yes   Education Details continue high level word finding worksheets given by Jerrell Mylar) Educated Patient   Methods Explanation;Demonstration;Verbal cues;Handout   Comprehension Verbalized understanding;Returned demonstration;Verbal cues required          SLP Short Term Goals - 12/31/16 1248      SLP SHORT TERM GOAL #1   Title Pt will attend to details in mildly complex cognitive linguistic tasks with 85% accuracy and occasional min A   Status Achieved     SLP SHORT TERM GOAL #2   Title Pt will complete various naming tasks of increasing complexity with 90% success over 2 sessions, given mod assist   Status Achieved     SLP SHORT TERM GOAL #3   Title Pt will implement and use a system for functional recall of daily and scheduled activities with mod assistance over 2 sessions   Status Achieved          SLP Long Term Goals - 12/31/16 1248      SLP LONG TERM GOAL #1   Title Pt will alternate attention between 2 mildly complex cognitive  linguistic tasks with 85% on each and occasional min A   Status Achieved     SLP LONG TERM GOAL #2   Title Pt will complete Level III (abstract) naming tasks with 90% accuracy without cues   Time 4   Period Weeks   Status On-going     SLP LONG TERM GOAL #3   Title Pt will report daily functional and effective use of compensatory strategies for recall of daily activities and appointments.    Status Achieved     SLP LONG TERM GOAL #4   Title pt will tell SLP 4 memory strategies over two sessions with modified independence   Baseline 12/31/16   Time 4   Period Weeks   Status On-going          Plan - 12/31/16 1245    Clinical Impression Statement Pt pleasant and cooperative with unfamiliar therapist. Pt reports  completion of speech therapy is June 14, and she reports feeling like she has made good progress in therapy. Continued ST intervention is recommended to maximize cognitive linguistic stkills.   Speech Therapy Frequency 2x / week   Duration --  8 weeks   Treatment/Interventions Language facilitation;Environmental controls;Cueing hierarchy;SLP instruction and feedback;Compensatory techniques;Cognitive reorganization;Functional tasks;Multimodal communcation approach;Patient/family education;Internal/external aids   Potential to Achieve Goals Good   Potential Considerations Family/community support;Ability to learn/carryover information;Cooperation/participation level;Previous level of function   SLP Home Exercise Plan reviewed   Consulted and Agree with Plan of Care Patient      Patient will benefit from skilled therapeutic intervention in order to improve the following deficits and impairments:   Cognitive communication deficit    Problem List Patient Active Problem List   Diagnosis Date Noted  . Left leg weakness   . Cerebrovascular accident (CVA) ([place])   . Hyperlipidemia   . OSA (obstructive sleep apnea)   . Left-sided weakness 10/11/2016  . Orthostatic hypotension 09/18/2015  . Chest pain 08/15/2015  . Acute sinusitis 08/19/2014  . Yeast infection 08/19/2014  . Vitamin D deficiency 05/20/2014  . Gastro-esophageal reflux 03/20/2014  . Allergic rhinitis 07/09/2013  . Sleep disorder 07/09/2013  . Elevated blood pressure (not hypertension) 07/09/2013  . Mood disorder ([place]) 11/23/2011  . Diabetes mellitus ([place]) 11/23/2011  . Health care maintenance 11/23/2011   Celia B. [place], MSP, CCC-SLP  Shonna Chock 12/31/2016, 12:49 PM  [facility] [address] [place], [place], [address] Phone: [phone]   Fax:  [phone]   Name: Wendy Mcclain MRN: [id number] Date of Birth: [birth year]

## 2016-12-31 NOTE — Therapy (Addendum)
[facility] [address] [place], [place], [address] Phone: [phone]   Fax:  [phone]  Occupational Therapy Treatment  Patient Details  Name: Wendy Mcclain MRN: [id number] Date of Birth: May 31, 1951 Referring Provider: Dr Ronnie Doss, DO  Encounter Date: 12/31/2016      OT End of Session - 12/31/16 1433    Visit Number 11   Number of Visits 17   Date for OT Re-Evaluation 01/18/17   Authorization Type Medicare -    Authorization - Visit Number 11   Authorization - Number of Visits 20   Activity Tolerance Patient tolerated treatment well   Behavior During Therapy [facility] for tasks assessed/performed      Past Medical History:  Diagnosis Date  . Arrhythmia   . Asthma   . Diabetes mellitus   . GERD (gastroesophageal reflux disease)     Past Surgical History:  Procedure Laterality Date  . ABDOMINAL HYSTERECTOMY  1993  . APPENDECTOMY  1963  . BREAST ENHANCEMENT SURGERY  1986    There were no vitals filed for this visit.  Time in:1020, Time out:1100   Supine on mat gentle left shoulder joint and scapular mobs followed by AA/ROM closed chain shoulder flexion and unilateral shoulder flexion, min facilitation.                         OT Education - 12/31/16 1059    Education provided Yes   Education Details yellow theraband HEP, 10-15 reps each exercise, memory compensations, visual compensations   Person(s) Educated Patient   Methods Explanation;Demonstration;Verbal cues;Handout   Comprehension Verbalized understanding;Returned demonstration;Verbal cues required          OT Short Term Goals - 12/23/16 1628      OT SHORT TERM GOAL #1   Title Pt will be Mod I HEP for coordination LUE (Target date: 12/16/16)   Time 4   Period Weeks   Status Achieved     OT SHORT TERM GOAL #2   Title Pt will be Mod I strengthening HEP for LUE (Target date 12/16/16)   Time 4   Period Weeks   Status On-going  Pt is perfoming ball ex in supine, have not progressed to shoulder strengthening, putty for hand strength     OT SHORT TERM GOAL #3   Title Pt will be supervision simple functional reach mid level using L UE as assist to dominant RUE w/o LOB during simulated ADL's   Time 4   Period Weeks   Status Achieved           OT Long Term Goals - 12/31/16 1021      OT LONG TERM GOAL #1   Title Pt wil ldemonstrate improved coordination as seen by improved 9 hole peg test score left of 28 seconds or less & Mod I fastening buttons/fasteners when dressing.   Baseline 11/18/16: L = 34.81 seconds, R = 24.28 seconds; Min A fastening buttons, tying shoes etc.   Time 8   Period Weeks   Status Achieved  5/24/2018R= 20.63, L = 25.21     OT LONG TERM GOAL #2   Title Pt will demonstrate improved LUE grip strength as seen by JAMAR grip assessment L of 15# or greater   Baseline 11/18/16 L = 4# vs R = 29# via JAMAR grip position II   Time 8   Period Weeks   Status Achieved  12/23/2016 R= 48, L= 39  OT LONG TERM GOAL #3   Title Pt will be Mod I simple snack prep in ADL kitchen w/o LOB   Time 8   Period Weeks   Status Achieved     OT LONG TERM GOAL #4   Title Pt will be supervision simple bill pay, check writing   Time 8   Period Weeks   Status Deferred  ST working on this     OT LONG TERM GOAL #5   Title Pt will be supervision memory strategies and cognitive compensatory strategies during ADL tasks   Time 8   Period Weeks   Status Achieved     OT LONG TERM GOAL #6   Title Pt will be Mod I compensatory strategies due to visual changes during ADL/homemaking activities in ADL kitchen   Time 8   Period Weeks   Status On-going             Patient will benefit from skilled therapeutic intervention in order to improve the following deficits and impairments:     Visit Diagnosis: Muscle weakness (generalized)  Hemiplegia and hemiparesis following cerebral infarction  affecting left non-dominant side (HCC)  Other lack of coordination  Visuospatial deficit  Other symptoms and signs involving cognitive functions following cerebral infarction    Problem List Patient Active Problem List   Diagnosis Date Noted  . Left leg weakness   . Cerebrovascular accident (CVA) ([place])   . Hyperlipidemia   . OSA (obstructive sleep apnea)   . Left-sided weakness 10/11/2016  . Orthostatic hypotension 09/18/2015  . Chest pain 08/15/2015  . Acute sinusitis 08/19/2014  . Yeast infection 08/19/2014  . Vitamin D deficiency 05/20/2014  . Gastro-esophageal reflux 03/20/2014  . Allergic rhinitis 07/09/2013  . Sleep disorder 07/09/2013  . Elevated blood pressure (not hypertension) 07/09/2013  . Mood disorder ([place]) 11/23/2011  . Diabetes mellitus ([place]) 11/23/2011  . Health care maintenance 11/23/2011    Wendy Mcclain 12/31/2016, 2:34 PM  [facility] [address] [place], [place], [address] Phone: [phone]   Fax:  [phone]  Name: Wendy Mcclain MRN: [id number] Date of Birth: [birth year]

## 2017-01-04 ENCOUNTER — Ambulatory Visit: Payer: Medicare Other

## 2017-01-04 ENCOUNTER — Encounter: Payer: Self-pay | Admitting: Occupational Therapy

## 2017-01-04 ENCOUNTER — Ambulatory Visit: Payer: Medicare Other | Admitting: Physical Therapy

## 2017-01-04 ENCOUNTER — Ambulatory Visit: Payer: Medicare Other | Admitting: Occupational Therapy

## 2017-01-04 DIAGNOSIS — R4701 Aphasia: Secondary | ICD-10-CM

## 2017-01-04 DIAGNOSIS — I69354 Hemiplegia and hemiparesis following cerebral infarction affecting left non-dominant side: Secondary | ICD-10-CM

## 2017-01-04 DIAGNOSIS — M6281 Muscle weakness (generalized): Secondary | ICD-10-CM | POA: Diagnosis not present

## 2017-01-04 DIAGNOSIS — R41841 Cognitive communication deficit: Secondary | ICD-10-CM

## 2017-01-04 DIAGNOSIS — R278 Other lack of coordination: Secondary | ICD-10-CM | POA: Diagnosis not present

## 2017-01-04 DIAGNOSIS — R41842 Visuospatial deficit: Secondary | ICD-10-CM | POA: Diagnosis not present

## 2017-01-04 DIAGNOSIS — I69318 Other symptoms and signs involving cognitive functions following cerebral infarction: Secondary | ICD-10-CM

## 2017-01-04 NOTE — Therapy (Signed)
[facility] [address] [place], [place], [address] Phone: [phone]   Fax:  [phone]  Occupational Therapy Treatment  Patient Details  Name: Wendy Mcclain MRN: [id number] Date of Birth: [birth year] Referring Provider: Dr Ronnie Doss, DO  Encounter Date: 01/04/2017      OT End of Session - 01/04/17 1235    Visit Number 12   Number of Visits 17   Date for OT Re-Evaluation 01/18/17   Authorization Type Medicare -    Authorization Time Period G code and progress note every 10th visit.   Authorization - Visit Number 12   Authorization - Number of Visits 20   OT Start Time [phone]   OT Stop Time 1015   OT Time Calculation (min) 44 min   Activity Tolerance Patient tolerated treatment well      Past Medical History:  Diagnosis Date  . Arrhythmia   . Asthma   . Diabetes mellitus   . GERD (gastroesophageal reflux disease)     Past Surgical History:  Procedure Laterality Date  . ABDOMINAL HYSTERECTOMY  1993  . APPENDECTOMY  1963  . BREAST ENHANCEMENT SURGERY  1986    There were no vitals filed for this visit.      Subjective Assessment - 01/04/17 0932    Subjective  I am doing good I am very busy   Pertinent History Chest pain with excertion; type 2 DM (insulin dependent); Allergic rhinitis; GERD; Arrhythmia. 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 No/denies                      OT Treatments/Exercises (OP) - 01/04/17 0001      ADLs   Cooking Addressed simple hot meal prep at ambulatory level (scrambled eggs) as well as clean up.  Emphasis on visual scanning and visual organization, non dominant use of LUE, safety, and divided attention.  Pt demonstrated  excellent safety with activity.  Pt required no cues or assistance and used LUE sponatneously and without dificulty for this task.  Pt also able to scan kitchen environment to find items with no cueing.    ADL Comments Discussed concrete uses of memory strategies - pt able to verbalize several examples of how she using strategies.  Pt given task of cooking dinner tonight with dtr to supervise as necessary. Pt verbalized understanding.      Exercises   Exercises Shoulder     Shoulder Exercises: Standing   Other Standing Exercises At patient request reviewed and practiced theraband exercises - pt able to complete HEP without cues and stated she had no shoulder pain.                    OT Short Term Goals - 01/04/17 1233      OT SHORT TERM GOAL #1   Title Pt will be Mod I HEP for coordination LUE (Target date: 12/16/16)   Time 4   Period Weeks   Status Achieved     OT SHORT TERM GOAL #2   Title Pt will be Mod I strengthening HEP for LUE (Target date 12/16/16)   Time 4   Period Weeks   Status On-going  Pt is perfoming ball ex in supine, have not progressed  to shoulder strengthening, putty for hand strength     OT SHORT TERM GOAL #3   Title Pt will be supervision simple functional reach mid level using L UE as assist to dominant RUE w/o LOB during simulated ADL's   Time 4   Period Weeks   Status Achieved           OT Long Term Goals - 01/04/17 1233      OT LONG TERM GOAL #1   Title Pt wil ldemonstrate improved coordination as seen by improved 9 hole peg test score left of 28 seconds or less & Mod I fastening buttons/fasteners when dressing.   Baseline 11/18/16: L = 34.81 seconds, R = 24.28 seconds; Min A fastening buttons, tying shoes etc.   Time 8   Period Weeks   Status Achieved  5/24/2018R= 20.63, L = 25.21     OT LONG TERM GOAL #2   Title Pt will demonstrate improved LUE grip strength as seen by JAMAR grip assessment L of 15# or greater   Baseline 11/18/16 L = 4#  vs R = 29# via JAMAR grip position II   Time 8   Period Weeks   Status Achieved  12/23/2016 R= 48, L= 39     OT LONG TERM GOAL #3   Title Pt will be Mod I simple snack prep in ADL kitchen w/o LOB   Time 8   Period Weeks   Status Achieved     OT LONG TERM GOAL #4   Title Pt will be supervision simple bill pay, check writing   Time 8   Period Weeks   Status Deferred  ST working on this     OT LONG TERM GOAL #5   Title Pt will be supervision memory strategies and cognitive compensatory strategies during ADL tasks   Time 8   Period Weeks   Status Achieved     OT LONG TERM GOAL #6   Title Pt will be Mod I compensatory strategies due to visual changes during ADL/homemaking activities in ADL kitchen   Time 8   Period Weeks   Status On-going               Plan - 01/04/17 1233    Clinical Impression Statement Pt progressing toward goals. Pt feels like she has made great progress and states she has more energy and is doing more than before.    Rehab Potential Good   OT Frequency 2x / week   OT Duration 8 weeks   OT Treatment/Interventions Self-care/ADL training;Therapeutic exercise;Cognitive remediation/compensation;Neuromuscular education;Visual/perceptual remediation/compensation;Energy conservation;Therapist, nutritional;Therapeutic exercises;Patient/family education;DME and/or AE instruction;Therapeutic activities   Plan continueto address overhead rech, visual perceptual skills, ask about dinner prep   Consulted and Agree with Plan of Care Patient      Patient will benefit from skilled therapeutic intervention in order to improve the following deficits and impairments:  Decreased cognition, Decreased knowledge of use of DME, Pain, Decreased coordination, Decreased mobility, Impaired sensation, Decreased strength, Decreased endurance, Decreased activity tolerance, Decreased range of motion, Decreased balance, Decreased knowledge of precautions, Decreased safety  awareness, Impaired perceived functional ability  Visit Diagnosis: Muscle weakness (generalized)  Hemiplegia and hemiparesis following cerebral infarction affecting left non-dominant side (HCC)  Other symptoms and signs involving cognitive functions following cerebral infarction    Problem List Patient Active Problem List   Diagnosis Date Noted  . Left leg weakness   . Cerebrovascular accident (CVA) ([place])   . Hyperlipidemia   . OSA (  obstructive sleep apnea)   . Left-sided weakness 10/11/2016  . Orthostatic hypotension 09/18/2015  . Chest pain 08/15/2015  . Acute sinusitis 08/19/2014  . Yeast infection 08/19/2014  . Vitamin D deficiency 05/20/2014  . Gastro-esophageal reflux 03/20/2014  . Allergic rhinitis 07/09/2013  . Sleep disorder 07/09/2013  . Elevated blood pressure (not hypertension) 07/09/2013  . Mood disorder ([place]) 11/23/2011  . Diabetes mellitus ([place]) 11/23/2011  . Health care maintenance 11/23/2011    Quay Burow, OTR/L 01/04/2017, 12:36 PM  [facility] [address] [place], [place], [address] Phone: [phone]   Fax:  [phone]  Name: Wendy Mcclain MRN: [id number] Date of Birth: Dec 05, 1950

## 2017-01-04 NOTE — Therapy (Signed)
[facility] [address] [place], [place], [address] Phone: [phone]   Fax:  [phone]  Speech Language Pathology Treatment  Patient Details  Name: Wendy Mcclain MRN: [id number] Date of Birth: 1950/09/19 Referring Provider: Janora Norlander, DO  Encounter Date: 01/04/2017      End of Session - 01/04/17 1050    Visit Number 14   Number of Visits 17   Date for SLP Re-Evaluation 01/14/17   SLP Start Time 1017   SLP Stop Time  1050   SLP Time Calculation (min) 33 min   Activity Tolerance Patient tolerated treatment well      Past Medical History:  Diagnosis Date  . Arrhythmia   . Asthma   . Diabetes mellitus   . GERD (gastroesophageal reflux disease)     Past Surgical History:  Procedure Laterality Date  . ABDOMINAL HYSTERECTOMY  1993  . APPENDECTOMY  1963  . BREAST ENHANCEMENT SURGERY  1986    There were no vitals filed for this visit.      Subjective Assessment - 01/04/17 1019    Subjective "I went ahead and cancelled Thursday - I have to go to that class."   Currently in Pain? No/denies               ADULT SLP TREATMENT - 01/04/17 1019      General Information   Behavior/Cognition Alert;Cooperative;Pleasant mood     Treatment Provided   Treatment provided Cognitive-Linquistic     Pain Assessment   Pain Assessment No/denies pain     Cognitive-Linquistic Treatment   Treatment focused on Cognition;Patient/family/caregiver education   Skilled Treatment "I really haven't had time to do (the homework)." Pt had graduation, beach trip since last session. In max complex naming tasks, pt.  Pt reports anomia average 1.5 instances/night. In naming tasks pt was WFL/WNL. Pt acknowledges she is very pleased with progress thus far. "I'm so much better." SLP to retest with Cognitive Linguistic Quick Test next session.     Assessment / Recommendations / Plan   Plan Continue with current plan of  care     Progression Toward Goals   Progression toward goals Progressing toward goals            SLP Short Term Goals - 12/31/16 1248      SLP SHORT TERM GOAL #1   Title Pt will attend to details in mildly complex cognitive linguistic tasks with 85% accuracy and occasional min A   Status Achieved     SLP SHORT TERM GOAL #2   Title Pt will complete various naming tasks of increasing complexity with 90% success over 2 sessions, given mod assist   Status Achieved     SLP SHORT TERM GOAL #3   Title Pt will implement and use a system for functional recall of daily and scheduled activities with mod assistance over 2 sessions   Status Achieved          SLP Long Term Goals - 01/04/17 1038      SLP LONG TERM GOAL #1   Title Pt will alternate attention between 2 mildly complex cognitive linguistic tasks with 85% on each and occasional min A   Status Achieved     SLP LONG TERM GOAL #2   Title Pt will complete Level III (abstract) naming tasks with 90% accuracy without cues   Time 3   Period Weeks   Status On-going     SLP LONG TERM GOAL #  3   Title Pt will report daily functional and effective use of compensatory strategies for recall of daily activities and appointments.    Status Achieved     SLP LONG TERM GOAL #4   Title pt will tell SLP 4 memory strategies over two sessions with modified independence   Baseline 12/31/16, 01-04-17   Status Achieved          Plan - 01/04/17 1051    Clinical Impression Statement Pt continues to display improving cognitive linguistic deficits in memory, attention and word-finding. SLP to use Cognitive Linguistic Quick Test next session to measure progress. Possible d/c in next 2-4 visits. Pt has been demonstrating good carryover of use of her planner and described to SLP today apropriate compensation techniques for memory deficits. Pt would cont to benefit from skilled ST to address cognitive linguistic and word finding deficits.   Speech  Therapy Frequency 2x / week   Duration --  8 weeks   Treatment/Interventions Language facilitation;Environmental controls;Cueing hierarchy;SLP instruction and feedback;Compensatory techniques;Cognitive reorganization;Functional tasks;Multimodal communcation approach;Patient/family education;Internal/external aids   Potential to Achieve Goals Good      Patient will benefit from skilled therapeutic intervention in order to improve the following deficits and impairments:   Cognitive communication deficit  Aphasia    Problem List Patient Active Problem List   Diagnosis Date Noted  . Left leg weakness   . Cerebrovascular accident (CVA) ([place])   . Hyperlipidemia   . OSA (obstructive sleep apnea)   . Left-sided weakness 10/11/2016  . Orthostatic hypotension 09/18/2015  . Chest pain 08/15/2015  . Acute sinusitis 08/19/2014  . Yeast infection 08/19/2014  . Vitamin D deficiency 05/20/2014  . Gastro-esophageal reflux 03/20/2014  . Allergic rhinitis 07/09/2013  . Sleep disorder 07/09/2013  . Elevated blood pressure (not hypertension) 07/09/2013  . Mood disorder ([place]) 11/23/2011  . Diabetes mellitus ([place]) 11/23/2011  . Health care maintenance 11/23/2011    [facility] ,[place], [place]  01/04/2017, 10:53 AM  [facility] [address] [place], [place], [address] Phone: [phone]   Fax:  [phone]   Name: Wendy Mcclain MRN: [id number] Date of Birth: Feb 24, 1951

## 2017-01-06 ENCOUNTER — Ambulatory Visit: Payer: Medicare Other | Admitting: Physical Therapy

## 2017-01-06 ENCOUNTER — Encounter: Payer: Medicare Other | Admitting: Occupational Therapy

## 2017-01-06 ENCOUNTER — Encounter: Payer: Medicare Other | Admitting: Speech Pathology

## 2017-01-10 ENCOUNTER — Other Ambulatory Visit: Payer: Self-pay | Admitting: Family Medicine

## 2017-01-11 ENCOUNTER — Ambulatory Visit: Payer: Medicare Other

## 2017-01-11 ENCOUNTER — Ambulatory Visit: Payer: Medicare Other | Admitting: Physical Therapy

## 2017-01-11 ENCOUNTER — Ambulatory Visit: Payer: Medicare Other | Admitting: Occupational Therapy

## 2017-01-11 DIAGNOSIS — M8580 Other specified disorders of bone density and structure, unspecified site: Secondary | ICD-10-CM | POA: Diagnosis not present

## 2017-01-11 DIAGNOSIS — I69318 Other symptoms and signs involving cognitive functions following cerebral infarction: Secondary | ICD-10-CM

## 2017-01-11 DIAGNOSIS — R278 Other lack of coordination: Secondary | ICD-10-CM

## 2017-01-11 DIAGNOSIS — R41842 Visuospatial deficit: Secondary | ICD-10-CM

## 2017-01-11 DIAGNOSIS — R4701 Aphasia: Secondary | ICD-10-CM

## 2017-01-11 DIAGNOSIS — E039 Hypothyroidism, unspecified: Secondary | ICD-10-CM | POA: Diagnosis not present

## 2017-01-11 DIAGNOSIS — M6281 Muscle weakness (generalized): Secondary | ICD-10-CM

## 2017-01-11 DIAGNOSIS — R41841 Cognitive communication deficit: Secondary | ICD-10-CM

## 2017-01-11 DIAGNOSIS — Z794 Long term (current) use of insulin: Secondary | ICD-10-CM | POA: Diagnosis not present

## 2017-01-11 DIAGNOSIS — I6930 Unspecified sequelae of cerebral infarction: Secondary | ICD-10-CM | POA: Diagnosis not present

## 2017-01-11 DIAGNOSIS — I69354 Hemiplegia and hemiparesis following cerebral infarction affecting left non-dominant side: Secondary | ICD-10-CM | POA: Diagnosis not present

## 2017-01-11 DIAGNOSIS — E1142 Type 2 diabetes mellitus with diabetic polyneuropathy: Secondary | ICD-10-CM | POA: Diagnosis not present

## 2017-01-11 LAB — HEMOGLOBIN A1C: HEMOGLOBIN A1C: 8.6

## 2017-01-11 NOTE — Therapy (Signed)
[facility] [address] [place], [place], [address] Phone: [phone]   Fax:  [phone]  Speech Language Pathology Treatment  Patient Details  Name: Wendy Mcclain MRN: [id number] Date of Birth: 02-15-1951 Referring Provider: Janora Norlander, DO  Encounter Date: 01/11/2017      End of Session - 01/11/17 1436    Visit Number 15   Number of Visits 17   Date for SLP Re-Evaluation 01/14/17   SLP Start Time 72   SLP Stop Time  1401   SLP Time Calculation (min) 41 min   Activity Tolerance Patient tolerated treatment well      Past Medical History:  Diagnosis Date  . Arrhythmia   . Asthma   . Diabetes mellitus   . GERD (gastroesophageal reflux disease)     Past Surgical History:  Procedure Laterality Date  . ABDOMINAL HYSTERECTOMY  1993  . APPENDECTOMY  1963  . BREAST ENHANCEMENT SURGERY  1986    There were no vitals filed for this visit.      Subjective Assessment - 01/11/17 1327    Subjective "My last day is Thursday."   Currently in Pain? No/denies               ADULT SLP TREATMENT - 01/11/17 1333      General Information   Behavior/Cognition Alert;Cooperative;Pleasant mood     Treatment Provided   Treatment provided Cognitive-Linquistic     Cognitive-Linquistic Treatment   Treatment focused on Cognition   Skilled Treatment Pt underwent the Cognitive Linguistic Quick Test (CLQT) today and all subtests were greater than cutoff scores. Pt reports only difficulty she has at this time is still minor, with anomia. SLP review compensations for anomia next session and also set pt up with home tasks with mod/mod complex  naming tasks.      Assessment / Recommendations / Plan   Plan Continue with current plan of care     Progression Toward Goals   Progression toward goals Progressing toward goals            SLP Short Term Goals - 01/11/17 1402      SLP SHORT TERM GOAL #1   Title Pt will attend to details in mildly complex cognitive linguistic tasks with 85% accuracy and occasional min A   Status Achieved     SLP SHORT TERM GOAL #2   Title Pt will complete various naming tasks of increasing complexity with 90% success over 2 sessions, given mod assist   Status Achieved     SLP SHORT TERM GOAL #3   Title Pt will implement and use a system for functional recall of daily and scheduled activities with mod assistance over 2 sessions   Status Achieved          SLP Long Term Goals - 01/11/17 1411      SLP LONG TERM GOAL #1   Title Pt will alternate attention between 2 mildly complex cognitive linguistic tasks with 85% on each and occasional min A   Status Achieved     SLP LONG TERM GOAL #2   Title Pt will complete Level III (abstract) naming tasks with 90% accuracy without cues   Time 2   Period Weeks   Status On-going     SLP LONG TERM GOAL #3   Title Pt will report daily functional and effective use of compensatory strategies for recall of daily activities and appointments.    Status Achieved  SLP LONG TERM GOAL #4   Title pt will tell SLP 4 memory strategies over two sessions with modified independence   Baseline 12/31/16, 01-04-17   Status Achieved          Plan - 01/11/17 1437    Clinical Impression Statement Use of Cognitive Linguistic Quick Test revelaed pt's skills are above cutoff with all subtests. Pt would like to d/c in next visit, SLP agrees. Pt would cont to benefit from skilled ST for one more session to address mild word finding deficits and also review/revisit and practice compensations for word finding.   Speech Therapy Frequency 2x / week   Duration --  8 weeks   Treatment/Interventions Language facilitation;Environmental controls;Cueing hierarchy;SLP instruction and feedback;Compensatory techniques;Cognitive reorganization;Functional tasks;Multimodal communcation approach;Patient/family education;Internal/external aids    Potential to Achieve Goals Good      Patient will benefit from skilled therapeutic intervention in order to improve the following deficits and impairments:   Cognitive communication deficit  Aphasia    Problem List Patient Active Problem List   Diagnosis Date Noted  . Left leg weakness   . Cerebrovascular accident (CVA) ([place])   . Hyperlipidemia   . OSA (obstructive sleep apnea)   . Left-sided weakness 10/11/2016  . Orthostatic hypotension 09/18/2015  . Chest pain 08/15/2015  . Acute sinusitis 08/19/2014  . Yeast infection 08/19/2014  . Vitamin D deficiency 05/20/2014  . Gastro-esophageal reflux 03/20/2014  . Allergic rhinitis 07/09/2013  . Sleep disorder 07/09/2013  . Elevated blood pressure (not hypertension) 07/09/2013  . Mood disorder ([place]) 11/23/2011  . Diabetes mellitus ([place]) 11/23/2011  . Health care maintenance 11/23/2011    [facility] ,[place], [place]  01/11/2017, 2:39 PM  [facility] [address] [place], [place], [address] Phone: [phone]   Fax:  [phone]   Name: Wendy Mcclain MRN: [id number] Date of Birth: 10/05/1950

## 2017-01-11 NOTE — Therapy (Signed)
[facility] [address] [place], [place], [address] Phone: [phone]   Fax:  [phone]  Occupational Therapy Treatment  Patient Details  Name: Wendy Mcclain MRN: [id number] Date of Birth: 06/08/1951 Referring Provider: Dr Ronnie Doss, DO  Encounter Date: 01/11/2017      OT End of Session - 01/11/17 1439    Visit Number 13   Number of Visits 17   Date for OT Re-Evaluation 01/18/17   Authorization Type Medicare -    Authorization Time Period G code and progress note every 10th visit.   Authorization - Visit Number 13   Authorization - Number of Visits 20   OT Start Time 1103   OT Stop Time 1594   OT Time Calculation (min) 41 min      Past Medical History:  Diagnosis Date  . Arrhythmia   . Asthma   . Diabetes mellitus   . GERD (gastroesophageal reflux disease)     Past Surgical History:  Procedure Laterality Date  . ABDOMINAL HYSTERECTOMY  1993  . APPENDECTOMY  1963  . BREAST ENHANCEMENT SURGERY  1986    There were no vitals filed for this visit.      Subjective Assessment - 01/11/17 1439    Subjective  Pt reports using LUE at home   Pertinent History Chest pain with excertion; type 2 DM (insulin dependent); Allergic rhinitis; GERD; Arrhythmia. 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 No/denies          Treatment; Reviewed previously issued  theraband HEP, upgraded to red band 10 reps each, pt returned demonstration following min v.c initally. functional overhead reaching with LUE to place/ retrieve items from cabinet Arm bike x 8 mins level 3 for conditioning/ reciprocal movements Gripper set at level 1 for sustained grip, min difficulty Tabletop scanning  small print with cognitive component to cross out the number that repeats 4 times, pt completed 1/2 worksheet with 100% accuracy                      OT Short Term Goals - 01/11/17 1420      OT SHORT TERM GOAL #1   Title Pt will be Mod I HEP for coordination LUE (Target date: 12/16/16)   Time 4   Period Weeks   Status Achieved     OT SHORT TERM GOAL #2   Title Pt will be Mod I strengthening HEP for LUE (Target date 12/16/16)   Time 4   Period Weeks   Status Achieved  met     OT SHORT TERM GOAL #3   Title Pt will be supervision simple functional reach mid level using L UE as assist to dominant RUE w/o LOB during simulated ADL's   Time 4   Period Weeks   Status Achieved           OT Long Term Goals - 01/04/17 1233      OT LONG TERM GOAL #1   Title Pt wil ldemonstrate improved coordination as seen by improved 9 hole peg test score left of 28 seconds or less & Mod I fastening buttons/fasteners when dressing.   Baseline 11/18/16: L = 34.81 seconds, R = 24.28 seconds; Min A fastening buttons, tying shoes etc.  Time 8   Period Weeks   Status Achieved  5/24/2018R= 20.63, L = 25.21     OT LONG TERM GOAL #2   Title Pt will demonstrate improved LUE grip strength as seen by JAMAR grip assessment L of 15# or greater   Baseline 11/18/16 L = 4# vs R = 29# via JAMAR grip position II   Time 8   Period Weeks   Status Achieved  12/23/2016 R= 48, L= 39     OT LONG TERM GOAL #3   Title Pt will be Mod I simple snack prep in ADL kitchen w/o LOB   Time 8   Period Weeks   Status Achieved     OT LONG TERM GOAL #4   Title Pt will be supervision simple bill pay, check writing   Time 8   Period Weeks   Status Deferred  ST working on this     OT LONG TERM GOAL #5   Title Pt will be supervision memory strategies and cognitive compensatory strategies during ADL tasks   Time 8   Period Weeks   Status Achieved     OT LONG TERM GOAL #6   Title Pt will be Mod I  compensatory strategies due to visual changes during ADL/homemaking activities in ADL kitchen   Time 8   Period Weeks   Status On-going               Plan - 01/11/17 1429    Clinical Impression Statement Pt is progressing towards goals for LUE strength and visual spatial skills.   Rehab Potential Good   OT Frequency 2x / week   OT Duration 8 weeks   OT Treatment/Interventions Self-care/ADL training;Therapeutic exercise;Cognitive remediation/compensation;Neuromuscular education;Visual/perceptual remediation/compensation;Energy conservation;Therapist, nutritional;Therapeutic exercises;Patient/family education;DME and/or AE instruction;Therapeutic activities   Plan environmental scanning, LUE functional reach check goals and d/c   OT Home Exercise Plan issued coordination, ball ex supine   Consulted and Agree with Plan of Care Patient      Patient will benefit from skilled therapeutic intervention in order to improve the following deficits and impairments:  Decreased cognition, Decreased knowledge of use of DME, Pain, Decreased coordination, Decreased mobility, Impaired sensation, Decreased strength, Decreased endurance, Decreased activity tolerance, Decreased range of motion, Decreased balance, Decreased knowledge of precautions, Decreased safety awareness, Impaired perceived functional ability  Visit Diagnosis: Muscle weakness (generalized)  Hemiplegia and hemiparesis following cerebral infarction affecting left non-dominant side (HCC)  Other symptoms and signs involving cognitive functions following cerebral infarction  Other lack of coordination  Visuospatial deficit    Problem List Patient Active Problem List   Diagnosis Date Noted  . Left leg weakness   . Cerebrovascular accident (CVA) ([place])   . Hyperlipidemia   . OSA (obstructive sleep apnea)   . Left-sided weakness 10/11/2016  . Orthostatic hypotension 09/18/2015  . Chest pain 08/15/2015  . Acute  sinusitis 08/19/2014  . Yeast infection 08/19/2014  . Vitamin D deficiency 05/20/2014  . Gastro-esophageal reflux 03/20/2014  . Allergic rhinitis 07/09/2013  . Sleep disorder 07/09/2013  . Elevated blood pressure (not hypertension) 07/09/2013  . Mood disorder ([place]) 11/23/2011  . Diabetes mellitus ([place]) 11/23/2011  . Health care maintenance 11/23/2011    Wendy Mcclain 01/11/2017, 2:40 PM  [facility] [address] [place], [place], [address] Phone: [phone]   Fax:  [phone]  Name: Wendy Mcclain MRN: [id number] Date of Birth: 1950/10/24

## 2017-01-12 ENCOUNTER — Encounter: Payer: Self-pay | Admitting: Student

## 2017-01-12 ENCOUNTER — Ambulatory Visit (INDEPENDENT_AMBULATORY_CARE_PROVIDER_SITE_OTHER): Payer: Medicare Other | Admitting: Student

## 2017-01-12 DIAGNOSIS — J069 Acute upper respiratory infection, unspecified: Secondary | ICD-10-CM | POA: Diagnosis not present

## 2017-01-12 DIAGNOSIS — I639 Cerebral infarction, unspecified: Secondary | ICD-10-CM

## 2017-01-12 DIAGNOSIS — B9789 Other viral agents as the cause of diseases classified elsewhere: Secondary | ICD-10-CM

## 2017-01-12 HISTORY — DX: Acute upper respiratory infection, unspecified: J06.9

## 2017-01-12 NOTE — Progress Notes (Signed)
   Subjective:    Patient ID: Wendy Mcclain, female    DOB: April 18, 1951, 66 y.o.   MRN: [id number]   CC: congestion, cough  HPI: 66 y/o F presents for congestion, cough   congestion, cough - started this AM - no fever, ear pain, or sore throat - she wanted to be seen "before it got worse"  Smoking status reviewed  Review of Systems  Per HPI, else denies chest pain, shortness of breath,     Objective:  BP 122/70   Pulse 87   Temp 97.9 F (36.6 C) (Oral)   Wt 140 lb (63.5 kg)   SpO2 98%   BMI 24.80 kg/m  Vitals and nursing note reviewed  General: NAD HEENT: no tenderness of maxillary or frontal sinuses, normal orooharynx without erythema or lesions midline uvula, normal TMS bilaterally Cardiac: RRR, Respiratory: CTAB, normal effort Extremities: no edema or cyanosis. WWP. Skin: warm and dry, no rashes noted Neuro: alert and oriented, no focal deficits   Assessment & Plan:    Viral URI with cough History and physical exam consistent with viral URI - will treat conservatively for now - return precautions discussed    Sakiya Stepka A. Lincoln Brigham MD, [facility] Resident PGY-3 Pager [phone] '

## 2017-01-12 NOTE — Patient Instructions (Signed)
Follow up as needed Try Alka Seltzer Cold and Flu for cold symptoms Tylenol for fever Call the office with questions or concerns

## 2017-01-12 NOTE — Assessment & Plan Note (Signed)
History and physical exam consistent with viral URI - will treat conservatively for now - return precautions discussed

## 2017-01-13 ENCOUNTER — Ambulatory Visit: Payer: Medicare Other | Admitting: Occupational Therapy

## 2017-01-13 ENCOUNTER — Ambulatory Visit: Payer: Medicare Other | Admitting: Physical Therapy

## 2017-01-13 ENCOUNTER — Ambulatory Visit: Payer: Medicare Other

## 2017-01-13 DIAGNOSIS — I69354 Hemiplegia and hemiparesis following cerebral infarction affecting left non-dominant side: Secondary | ICD-10-CM

## 2017-01-13 DIAGNOSIS — R41842 Visuospatial deficit: Secondary | ICD-10-CM | POA: Diagnosis not present

## 2017-01-13 DIAGNOSIS — R278 Other lack of coordination: Secondary | ICD-10-CM | POA: Diagnosis not present

## 2017-01-13 DIAGNOSIS — I69318 Other symptoms and signs involving cognitive functions following cerebral infarction: Secondary | ICD-10-CM | POA: Diagnosis not present

## 2017-01-13 DIAGNOSIS — M6281 Muscle weakness (generalized): Secondary | ICD-10-CM

## 2017-01-13 DIAGNOSIS — R4701 Aphasia: Secondary | ICD-10-CM

## 2017-01-13 NOTE — Patient Instructions (Signed)
Continue to complete crossword puzzles

## 2017-01-13 NOTE — Therapy (Signed)
[facility] [address] [place], [place], [address] Phone: [phone]   Fax:  [phone]  Occupational Therapy Treatment  Patient Details  Name: Wendy Mcclain MRN: [id number] Date of Birth: 05-04-1951 Referring Provider: Dr Ronnie Doss, DO  Encounter Date: 01/13/2017      OT End of Session - 01/13/17 1048    Visit Number 14   Number of Visits 17   Date for OT Re-Evaluation 01/18/17   Authorization Type Medicare -    Authorization Time Period G code and progress note every 10th visit.   Authorization - Visit Number 14   Authorization - Number of Visits 20   OT Start Time 1017   OT Stop Time 1030   OT Time Calculation (min) 13 min   Activity Tolerance Patient tolerated treatment well   Behavior During Therapy WFL for tasks assessed/performed      Past Medical History:  Diagnosis Date  . Arrhythmia   . Asthma   . Diabetes mellitus   . GERD (gastroesophageal reflux disease)     Past Surgical History:  Procedure Laterality Date  . ABDOMINAL HYSTERECTOMY  1993  . APPENDECTOMY  1963  . BREAST ENHANCEMENT SURGERY  1986    There were no vitals filed for this visit.            Treatment: Checked progress towards goals. Reviewed visual compensation strategies with pt. 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 Pt agrees with plans for d/c                  OT Short Term Goals - 01/11/17 1420      OT SHORT TERM GOAL #1   Title Pt will be Mod I HEP for coordination LUE (Target date: 12/16/16)   Time 4   Period Weeks   Status Achieved     OT SHORT TERM GOAL #2   Title Pt will be Mod I strengthening HEP for LUE (Target date 12/16/16)   Time 4   Period Weeks   Status Achieved  met     OT SHORT TERM GOAL #3   Title Pt will be supervision simple functional reach mid  level using L UE as assist to dominant RUE w/o LOB during simulated ADL's   Time 4   Period Weeks   Status Achieved           OT Long Term Goals - 01/13/17 1024      OT LONG TERM GOAL #1   Title Pt wil ldemonstrate improved coordination as seen by improved 9 hole peg test score left of 28 seconds or less & Mod I fastening buttons/fasteners when dressing.   Baseline 11/18/16: L = 34.81 seconds, R = 24.28 seconds; Min A fastening buttons, tying shoes etc.   Time 8   Period Weeks   Status Achieved  LUE 24.59 secs     OT LONG TERM GOAL #2   Title Pt will demonstrate improved LUE grip strength as seen by JAMAR grip assessment L of 15# or greater   Baseline 11/18/16 L = 4# vs R = 29# via JAMAR grip position II   Time 8   Period Weeks   Status Achieved  LUE 41 lbs     OT LONG TERM GOAL #3   Title Pt will be Mod I simple snack prep in ADL kitchen w/o  LOB   Time 8   Period Weeks   Status Achieved     OT LONG TERM GOAL #4   Title Pt will be supervision simple bill pay, check writing   Time 8   Period Weeks   Status Deferred  ST working on this     OT LONG TERM GOAL #5   Title Pt will be supervision memory strategies and cognitive compensatory strategies during ADL tasks   Time 8   Period Weeks   Status Achieved     OT LONG TERM GOAL #6   Title Pt will be Mod I compensatory strategies due to visual changes during ADL/homemaking activities in ADL kitchen   Time 8   Period Weeks   Status Achieved               Plan - 02/11/17 1049    Clinical Impression Statement Pt demonstrates excellent overall progress and pt agrees with plans for d/c.   Rehab Potential Good   OT Frequency 2x / week   OT Duration 8 weeks   OT Treatment/Interventions Self-care/ADL training;Therapeutic exercise;Cognitive remediation/compensation;Neuromuscular education;Visual/perceptual remediation/compensation;Energy conservation;Therapist, nutritional;Therapeutic exercises;Patient/family  education;DME and/or AE instruction;Therapeutic activities   Plan discharge occupational therapy   OT Home Exercise Plan issued coordination, ball ex supine   Consulted and Agree with Plan of Care Patient      Patient will benefit from skilled therapeutic intervention in order to improve the following deficits and impairments:  Decreased cognition, Decreased knowledge of use of DME, Pain, Decreased coordination, Decreased mobility, Impaired sensation, Decreased strength, Decreased endurance, Decreased activity tolerance, Decreased range of motion, Decreased balance, Decreased knowledge of precautions, Decreased safety awareness, Impaired perceived functional ability  Visit Diagnosis: Muscle weakness (generalized)  Hemiplegia and hemiparesis following cerebral infarction affecting left non-dominant side (HCC)  Other symptoms and signs involving cognitive functions following cerebral infarction  Other lack of coordination  Visuospatial deficit      G-Codes - 2017-02-11 1052    Functional Assessment Tool Used (Outpatient only) Clinical judgement; 9 hole peg test 24.59 secs grip assessment LUE 41 lbs   Functional Limitation Carrying, moving and handling objects   Carrying, Moving and Handling Objects Goal Status (O8757) At least 20 percent but less than 40 percent impaired, limited or restricted   Carrying, Moving and Handling Objects Discharge Status [phone]) At least 20 percent but less than 40 percent impaired, limited or restricted    OCCUPATIONAL THERAPY DISCHARGE SUMMARY    Current functional level related to goals / functional outcomes: Pt made excellent progress towards goals.   Remaining deficits: Mildly decreased strength, visuospatial deficits   Education / Equipment: Pt was educated regarding HEP and compensatory strategies for visual changes. Pt verbalized understanding of all education. Plan: Patient agrees to discharge.  Patient goals were met. Patient is being  discharged due to meeting the stated rehab goals.  ?????      Problem List Patient Active Problem List   Diagnosis Date Noted  . Viral URI with cough 01/12/2017  . Left leg weakness   . Cerebrovascular accident (CVA) ([place])   . Hyperlipidemia   . OSA (obstructive sleep apnea)   . Left-sided weakness 10/11/2016  . Orthostatic hypotension 09/18/2015  . Chest pain 08/15/2015  . Acute sinusitis 08/19/2014  . Yeast infection 08/19/2014  . Vitamin D deficiency 05/20/2014  . Gastro-esophageal reflux 03/20/2014  . Allergic rhinitis 07/09/2013  . Sleep disorder 07/09/2013  . Elevated blood pressure (not hypertension) 07/09/2013  . Mood disorder ([place])  11/23/2011  . Diabetes mellitus ([place]) 11/23/2011  . Health care maintenance 11/23/2011    Margaret Staggs 01/13/2017, 10:53 AM Theone Murdoch, OTR/L [phone] Phone: [phone] 10:57 AM 01/13/17 [facility] [address] [place], [place], [address] Phone: [phone]   Fax:  [phone]  Name: Wendy Mcclain MRN: [id number] Date of Birth: 1950/09/29

## 2017-01-13 NOTE — Therapy (Signed)
[facility] [address] [place], [place], [address] Phone: [phone]   Fax:  [phone]  Speech Language Pathology Treatment  Patient Details  Name: Wendy Mcclain MRN: [id number] Date of Birth: Aug 28, 1950 Referring Provider: Janora Norlander, DO  Encounter Date: 01/13/2017      End of Session - 01/13/17 1145    Visit Number 16   Number of Visits 17   Date for SLP Re-Evaluation 01/14/17   SLP Start Time 0850   SLP Stop Time  0930   SLP Time Calculation (min) 40 min   Activity Tolerance Patient tolerated treatment well      Past Medical History:  Diagnosis Date  . Arrhythmia   . Asthma   . Diabetes mellitus   . GERD (gastroesophageal reflux disease)     Past Surgical History:  Procedure Laterality Date  . ABDOMINAL HYSTERECTOMY  1993  . APPENDECTOMY  1963  . BREAST ENHANCEMENT SURGERY  1986    There were no vitals filed for this visit.      Subjective Assessment - 01/13/17 0853    Subjective "Graduation day!"   Currently in Pain? No/denies               ADULT SLP TREATMENT - 01/13/17 0853      General Information   Behavior/Cognition Alert;Cooperative;Pleasant mood     Treatment Provided   Treatment provided Cognitive-Linquistic     Cognitive-Linquistic Treatment   Treatment focused on Aphasia   Skilled Treatment 25 minute mod complex/complex conversation without anomia - pt agreed. Specific word finding tasks completed with SBA. Pt agrees with d/c today.     Assessment / Recommendations / Plan   Plan Continue with current plan of care     Progression Toward Goals   Progression toward goals Progressing toward goals            SLP Short Term Goals - 01/11/17 1402      SLP SHORT TERM GOAL #1   Title Pt will attend to details in mildly complex cognitive linguistic tasks with 85% accuracy and occasional min A   Status Achieved     SLP SHORT TERM GOAL #2   Title Pt  will complete various naming tasks of increasing complexity with 90% success over 2 sessions, given mod assist   Status Achieved     SLP SHORT TERM GOAL #3   Title Pt will implement and use a system for functional recall of daily and scheduled activities with mod assistance over 2 sessions   Status Achieved          SLP Long Term Goals - 01/13/17 1149      SLP LONG TERM GOAL #1   Title Pt will alternate attention between 2 mildly complex cognitive linguistic tasks with 85% on each and occasional min A   Status Achieved     SLP LONG TERM GOAL #2   Title Pt will complete Level III (abstract) naming tasks with 90% accuracy without cues   Status Achieved     SLP LONG TERM GOAL #3   Title Pt will report daily functional and effective use of compensatory strategies for recall of daily activities and appointments.    Status Achieved     SLP LONG TERM GOAL #4   Title pt will tell SLP 4 memory strategies over two sessions with modified independence   Baseline 12/31/16, 01-04-17   Status Achieved          Plan -  02/03/17 1147    Clinical Impression Statement Pt with good success in 30 minutes conversation - no overt anomic episodes observed. Pt agrees with d/c today.   Treatment/Interventions Language facilitation;Environmental controls;Cueing hierarchy;SLP instruction and feedback;Compensatory techniques;Cognitive reorganization;Functional tasks;Multimodal communcation approach;Patient/family education;Internal/external aids   Potential to Achieve Goals Good      Patient will benefit from skilled therapeutic intervention in order to improve the following deficits and impairments:   Aphasia      G-Codes - 02-03-17 1150    Functional Assessment Tool Used NOMS   Functional Limitations Attention   Attention Goal Status (F2072) At least 1 percent but less than 20 percent impaired, limited or restricted   Attention Discharge Status (T8288) At least 1 percent but less than 20 percent  impaired, limited or restricted     [place]  Visits from Start of Care: 16  Current functional level related to goals / functional outcomes: Pt met all STGs and LTGs. On the last day of therapy pt spoke 30 minutes in mod complex/complex conversation without overt signs of anomia. Pt's attention has improved but does not appear to this SLP to be at [facility].   Remaining deficits: Mild high level attention.   Education / Equipment: Compensations for anomia, attention.  Plan: Patient agrees to discharge.  Patient goals were met. Patient is being discharged due to meeting the stated rehab goals.  ?????      Problem List Patient Active Problem List   Diagnosis Date Noted  . Viral URI with cough 01/12/2017  . Left leg weakness   . Cerebrovascular accident (CVA) ([place])   . Hyperlipidemia   . OSA (obstructive sleep apnea)   . Left-sided weakness 10/11/2016  . Orthostatic hypotension 09/18/2015  . Chest pain 08/15/2015  . Acute sinusitis 08/19/2014  . Yeast infection 08/19/2014  . Vitamin D deficiency 05/20/2014  . Gastro-esophageal reflux 03/20/2014  . Allergic rhinitis 07/09/2013  . Sleep disorder 07/09/2013  . Elevated blood pressure (not hypertension) 07/09/2013  . Mood disorder ([place]) 11/23/2011  . Diabetes mellitus ([place]) 11/23/2011  . Health care maintenance 11/23/2011    [facility] ,[place], [place]  02/03/2017, 11:51 AM  [facility] [address] [place], [place], [address] Phone: [phone]   Fax:  [phone]   Name: SHECCID LAHMANN MRN: [id number] Date of Birth: [birth year]

## 2017-03-11 ENCOUNTER — Other Ambulatory Visit: Payer: Self-pay | Admitting: Family Medicine

## 2017-03-14 ENCOUNTER — Other Ambulatory Visit: Payer: Self-pay | Admitting: *Deleted

## 2017-03-14 DIAGNOSIS — K219 Gastro-esophageal reflux disease without esophagitis: Secondary | ICD-10-CM

## 2017-03-15 ENCOUNTER — Other Ambulatory Visit: Payer: Self-pay

## 2017-03-15 DIAGNOSIS — K219 Gastro-esophageal reflux disease without esophagitis: Secondary | ICD-10-CM

## 2017-03-15 MED ORDER — ASPIRIN EC 325 MG PO TBEC: 325.0000 mg | DELAYED_RELEASE_TABLET | Freq: Every day | ORAL | 0 refills | Status: DC

## 2017-03-15 MED ORDER — OMEPRAZOLE 20 MG PO CPDR: DELAYED_RELEASE_CAPSULE | ORAL | 0 refills | Status: DC

## 2017-03-15 NOTE — Telephone Encounter (Signed)
patient calling to request refill of:  Name of Medication(s): aspirin and omsprazole Last date of OV:  01/12/2017 Pharmacy:  [facility] in [place]  Will route refill request to Clinic RN.  Discussed with patient policy to call pharmacy for future refills.  Also, discussed refills may take up to 48 hours to approve or deny.  Wendy Mcclain

## 2017-04-13 DIAGNOSIS — E039 Hypothyroidism, unspecified: Secondary | ICD-10-CM | POA: Diagnosis not present

## 2017-04-13 DIAGNOSIS — E1142 Type 2 diabetes mellitus with diabetic polyneuropathy: Secondary | ICD-10-CM | POA: Diagnosis not present

## 2017-04-13 DIAGNOSIS — E1165 Type 2 diabetes mellitus with hyperglycemia: Secondary | ICD-10-CM | POA: Diagnosis not present

## 2017-04-13 DIAGNOSIS — I6930 Unspecified sequelae of cerebral infarction: Secondary | ICD-10-CM | POA: Diagnosis not present

## 2017-04-13 DIAGNOSIS — M8580 Other specified disorders of bone density and structure, unspecified site: Secondary | ICD-10-CM | POA: Diagnosis not present

## 2017-04-13 DIAGNOSIS — Z794 Long term (current) use of insulin: Secondary | ICD-10-CM | POA: Diagnosis not present

## 2017-04-14 ENCOUNTER — Other Ambulatory Visit: Payer: Self-pay | Admitting: Family Medicine

## 2017-04-20 ENCOUNTER — Ambulatory Visit (INDEPENDENT_AMBULATORY_CARE_PROVIDER_SITE_OTHER): Payer: Medicare Other | Admitting: Family Medicine

## 2017-04-20 ENCOUNTER — Encounter: Payer: Self-pay | Admitting: Family Medicine

## 2017-04-20 VITALS — BP 138/80 | HR 78 | Temp 98.5°F | Ht 63.0 in | Wt 138.0 lb

## 2017-04-20 DIAGNOSIS — B9789 Other viral agents as the cause of diseases classified elsewhere: Secondary | ICD-10-CM

## 2017-04-20 DIAGNOSIS — F4321 Adjustment disorder with depressed mood: Secondary | ICD-10-CM

## 2017-04-20 DIAGNOSIS — I639 Cerebral infarction, unspecified: Secondary | ICD-10-CM | POA: Diagnosis not present

## 2017-04-20 DIAGNOSIS — M7121 Synovial cyst of popliteal space [Baker], right knee: Secondary | ICD-10-CM | POA: Diagnosis not present

## 2017-04-20 DIAGNOSIS — E119 Type 2 diabetes mellitus without complications: Secondary | ICD-10-CM

## 2017-04-20 DIAGNOSIS — F4320 Adjustment disorder, unspecified: Secondary | ICD-10-CM

## 2017-04-20 DIAGNOSIS — J069 Acute upper respiratory infection, unspecified: Secondary | ICD-10-CM | POA: Diagnosis not present

## 2017-04-20 DIAGNOSIS — G44209 Tension-type headache, unspecified, not intractable: Secondary | ICD-10-CM | POA: Insufficient documentation

## 2017-04-20 HISTORY — DX: Adjustment disorder with depressed mood: F43.21

## 2017-04-20 HISTORY — DX: Tension-type headache, unspecified, not intractable: G44.209

## 2017-04-20 HISTORY — DX: Synovial cyst of popliteal space (Baker), right knee: M71.21

## 2017-04-20 LAB — POCT GLYCOSYLATED HEMOGLOBIN (HGB A1C): Hemoglobin A1C: 8.4

## 2017-04-20 NOTE — Assessment & Plan Note (Signed)
Counseled to reach out to supportive family members and friends.  Reassured that her sadness was normal given her recent life changes, but that if she experiences mood disturbances and wishes to speak with someone, we can refer her to our behavioral medicine staff.

## 2017-04-20 NOTE — Assessment & Plan Note (Signed)
Baker's cyst is most likely diagnosis for patient's pain behind her right knee.  Patient was told that it can be aspirated if it starts to impair her mobility, but it can be watched for now.  Reassurance was given that this is likely not a DVT, but signs and symptoms of a DVT were given to the patient, and she was told that if these develop, she should go to the ED.

## 2017-04-20 NOTE — Progress Notes (Signed)
Subjective:    Wendy Mcclain - 66 y.o. female MRN [id number]  Date of birth: [birth year]  HPI  Wendy Mcclain is here with the following complaints:  Congestion and cough -one month in duration -both symptoms are getting worse -takes Flonase infrequently, only as needed -takes cetirizine nightly -nonproductive cough  Headache -right sided, frontal -occurs during most afternoons -constant pain, not throbbing -moderate intensity  Pain behind right knee -no change in color, no recent immobilization or surgeries -no swelling in leg  Diabetes Mellitus Type II: -follows with endocrinologist, who has recently changed the dosage of her Tresiba and added Humalog -tries to adhere to medications but admits that she gets tired of frequent needle sticks  Grief reaction: -lost her husband to cancer recently -had cerebral infarction resulting in left hemiplegia; has recently completed therapy for this   Health Maintenance:  Health Maintenance Due  Topic Date Due  . Hepatitis C Screening  [birth year]  . FOOT EXAM  09/22/1959  . OPHTHALMOLOGY EXAM  09/22/1959  . MAMMOGRAM  09/22/1999  . URINE MICROALBUMIN  07/09/2014  . DEXA SCAN  09/21/2014  . PNA vac Low Risk Adult (1 of 2 - PCV13) 09/21/2014  . HEMOGLOBIN A1C  04/14/2017    -  reports that she has never smoked. She has never used smokeless tobacco. - Review of Systems: Per HPI. - Past Medical History: Patient Active Problem List   Diagnosis Date Noted  . Tension headache 04/20/2017  . Baker's cyst of knee, right 04/20/2017  . Grief reaction 04/20/2017  . Viral URI with cough 01/12/2017  . Left leg weakness   . Cerebrovascular accident (CVA) ([place])   . Hyperlipidemia   . OSA (obstructive sleep apnea)   . Left-sided weakness 10/11/2016  . Orthostatic hypotension 09/18/2015  . Chest pain 08/15/2015  . Acute sinusitis 08/19/2014  . Yeast infection 08/19/2014  . Vitamin D deficiency 05/20/2014  . Gastro-esophageal  reflux 03/20/2014  . Allergic rhinitis 07/09/2013  . Sleep disorder 07/09/2013  . Elevated blood pressure (not hypertension) 07/09/2013  . Mood disorder ([place]) 11/23/2011  . Diabetes mellitus ([place]) 11/23/2011  . Health care maintenance 11/23/2011   - Medications: reviewed and updated   Objective:   Physical Exam BP 138/80   Pulse 78   Temp 98.5 F (36.9 C) (Oral)   Ht 5\' 3"  (1.6 m)   Wt 62.6 kg (138 lb)   SpO2 98%   BMI 24.45 kg/m  Gen: NAD, alert, cooperative with exam, well-appearing HEENT: NCAT, PERRL, clear conjunctiva, oropharynx clear, supple neck CV: RRR, good S1/S2, no murmur, no edema, capillary refill brisk  Resp: CTABL, no wheezes, non-labored, cough present during inhalation Abd: SNTND, BS present, no guarding or organomegaly Skin: no rashes, normal turgor  Neuro: no gross deficits.  Psych: good insight, alert and oriented, frequently tearful during visit      Assessment & Plan:   Viral URI with cough Advised patient to take Flonase twice daily and to continue taking cetirizine for symptom relief.  Advised patient that post-viral cough can often last for many weeks following the resolution of other viral symptoms.  Told her to call the clinic if symptoms do not resolve or acutely worsen after one month.  Tension headache Likely related to her stress level as well as her congestion and cough.  Was advised to take Tylenol as needed for this and to call the office or go to the ED if severity increases significantly.  Baker's cyst of knee, right  Baker's cyst is most likely diagnosis for patient's pain behind her right knee.  Patient was told that it can be aspirated if it starts to impair her mobility, but it can be watched for now.  Reassurance was given that this is likely not a DVT, but signs and symptoms of a DVT were given to the patient, and she was told that if these develop, she should go to the ED.  Diabetes mellitus ([place]) Will defer to her Endocrinologist  for management.  A1c slightly improved today from 8.8 to 8.4.  Grief reaction Counseled to reach out to supportive family members and friends.  Reassured that her sadness was normal given her recent life changes, but that if she experiences mood disturbances and wishes to speak with someone, we can refer her to our behavioral medicine staff.    Maia Breslow, M.D. 04/20/2017, 4:40 PM PGY-1, [place]

## 2017-04-20 NOTE — Assessment & Plan Note (Signed)
Advised patient to take Flonase twice daily and to continue taking cetirizine for symptom relief.  Advised patient that post-viral cough can often last for many weeks following the resolution of other viral symptoms.  Told her to call the clinic if symptoms do not resolve or acutely worsen after one month.

## 2017-04-20 NOTE — Patient Instructions (Signed)
It was nice meeting you today Wendy Mcclain!  For your viral upper respiratory infection, please start taking your Flonase twice daily and continue taking your cetirizine nightly.  Aim for the outer corners of your eyes when using the nose spray.  Please call the clinic if your symptoms have not improved after one month.    The pain behind your knee is likely a Baker's cyst.  This should eventually go away on its own and is not dangerous.  However, if you develop a dark color to your leg, increased swelling, or shortness of breath, please go to the Emergency Department.    Your headache is likely a tension headache that is made worse by your coughing and congestion.  Tylenol will help this pain, and so will the Flonase and cetirizine.  Please call the clinic if the headache suddenly worsens in severity.  If you find that you need someone to talk to since you are under considerable stress, our behavioral medicine specialists will be happy to see you - just call our clinic to arrange this.  If you have any questions or concerns, please feel free to call the clinic.   Be well,  Dr. Shan Levans

## 2017-04-20 NOTE — Assessment & Plan Note (Signed)
Likely related to her stress level as well as her congestion and cough.  Was advised to take Tylenol as needed for this and to call the office or go to the ED if severity increases significantly.

## 2017-04-20 NOTE — Assessment & Plan Note (Signed)
Will defer to her Endocrinologist for management.  A1c slightly improved today from 8.8 to 8.4.

## 2017-04-25 DIAGNOSIS — I639 Cerebral infarction, unspecified: Secondary | ICD-10-CM | POA: Diagnosis not present

## 2017-04-25 DIAGNOSIS — E119 Type 2 diabetes mellitus without complications: Secondary | ICD-10-CM | POA: Diagnosis not present

## 2017-04-25 DIAGNOSIS — D485 Neoplasm of uncertain behavior of skin: Secondary | ICD-10-CM | POA: Diagnosis not present

## 2017-05-12 ENCOUNTER — Other Ambulatory Visit: Payer: Self-pay | Admitting: Family Medicine

## 2017-06-06 NOTE — Progress Notes (Signed)
[facility]  PATIENT: Wendy Mcclain DOB: [birth year]   REASON FOR VISIT:  for stroke right caudate lacunar infarct secondary to small vessel disease in March 2018 HISTORY FROM: Patient    HISTORY OF PRESENT ILLNESS: UPDATE 11/6/2018CM [name], 66 year old female returns for follow-up with history of stroke event in March 2018.  She remains on aspirin for secondary stroke prevention without further stroke or TIA symptoms.  She has minimal bruising, no bleeding.  She remains on Lipitor for hyperlipidemia without myalgias.  [facility]  She is still having difficulty with her diabetes with adjustments in her insulin by her endocrinologist.  Most recent hemoglobin A1c 8.4 in September.  Sleep study was negative for obstructive sleep apnea.  She returns for reevaluation  11/30/16 CMMs. [place], 66 year old female with uncontrolled diabetes history of asthma and elevated blood pressure presented to the hospital with left leg weakness. She did not receive IV TPA,. She also had a left-sided mild frontal headache that started with the onset of her weakness. MRI of the brain right caudate lacunar infarct secondary to small vessel disease. MRA unremarkable. Carotid Doppler unremarkable. 2-D echo 55-60%. LDL 79 hemoglobin A1c 8.8. She returns to the stroke clinic today for follow-up. She remains on aspirin 325 daily with no bruising and no bleeding. She has not had further stroke or TIA symptoms. She is on Lipitor for hyperlipidemia without complaints of myalgias. She has had some changes to her insulin dose by her endocrinologist and she is continuing to monitor that closely. She has had evaluation for obstructive sleep apnea but does not know the results. 30 day event monitor without episodes of atrial fibrillation or other irregular beats. She is continuing to get physical therapy and occupational therapy for her left-sided weakness. She returns for  reevaluation.   REVIEW OF SYSTEMS: Full 14 system review of systems performed and notable only for those listed, all others are neg:  Constitutional: neg Cardiovascular: neg Ear/Nose/Throat: neg  Skin: neg Eyes: neg Respiratory: neg Gastroitestinal: neg  Hematology/Lymphatic: neg  Endocrine: neg Musculoskeletal: Joint pain Allergy/Immunology: neg Neurological: neg Psychiatric: neg Sleep : neg   ALLERGIES: Allergies  Allergen Reactions  . Metformin And Related Diarrhea    HOME MEDICATIONS: Outpatient Medications Prior to Visit  Medication Sig Dispense Refill  . albuterol (PROVENTIL HFA;VENTOLIN HFA) 108 (90 Base) MCG/ACT inhaler Inhale 2 puffs into the lungs every 6 (six) hours as needed for wheezing or shortness of breath. 3 Inhaler 1  . aspirin EC 325 MG tablet TAKE 1 TABLET(325 MG) BY MOUTH DAILY 30 tablet 0  . atorvastatin (LIPITOR) 40 MG tablet TAKE 1 TABLET BY MOUTH DAILY AT 6 PM 90 tablet 4  . cetirizine (ZYRTEC) 10 MG tablet Take 1 tablet (10 mg total) by mouth daily. 90 tablet 4  . guaiFENesin (MUCINEX) 600 MG 12 hr tablet Take 1 tablet (600 mg total) by mouth 2 (two) times daily. For thinning of secretions 30 tablet 0  . HUMALOG KWIKPEN 100 UNIT/ML KiwkPen Inject 4 Units into the skin 4 (four) times daily -  before meals and at bedtime.  0  . Insulin Degludec (TRESIBA FLEXTOUCH) 100 UNIT/ML SOPN Inject 32 Units into the skin daily.    . Multiple Vitamin (MULTIVITAMIN) tablet Take 1 tablet by mouth daily.    Marland Kitchen omeprazole (PRILOSEC) 20 MG capsule TAKE 1 CAPSULE(20 MG) BY MOUTH DAILY 90 capsule 0  . senna-docusate (SENOKOT-S) 8.6-50 MG tablet  TAKE 1 TABLET BY MOUTH EVERY NIGHT AT BEDTIME AS NEEDED FOR MILD CONSTIPATION 30 tablet 0   No facility-administered medications prior to visit.     PAST MEDICAL HISTORY: Past Medical History:  Diagnosis Date  . Arrhythmia   . Asthma   . Diabetes mellitus   . GERD (gastroesophageal reflux disease)   . Stroke [facility])      PAST SURGICAL HISTORY: Past Surgical History:  Procedure Laterality Date  . ABDOMINAL HYSTERECTOMY  1993  . APPENDECTOMY  1963  . BREAST ENHANCEMENT SURGERY  1986    FAMILY HISTORY: Family History  Problem Relation Age of Onset  . Alzheimer's disease Mother   . Cancer Father        esophageal  . Aortic aneurysm Paternal Aunt        2 aunts died at age 72  . Cancer Sister        stomach  . Cancer Paternal Grandfather        mouth & larynx  . Cancer Paternal Aunt        colon  . Heart disease Unknown   . Diabetes Unknown   . Hyperlipidemia Unknown   . Hypertension Unknown   . Stroke Unknown   . Thyroid disease Sister   . Hyperlipidemia Daughter   . Thyroid disease Daughter   . Colon cancer Neg Hx     SOCIAL HISTORY: Social History   Socioeconomic History  . Marital status: Married    Spouse name: Not on file  . Number of children: Not on file  . Years of education: Not on file  . Highest education level: Not on file  Social Needs  . Financial resource strain: Not on file  . Food insecurity - worry: Not on file  . Food insecurity - inability: Not on file  . Transportation needs - medical: Not on file  . Transportation needs - non-medical: Not on file  Occupational History  . Not on file  Tobacco Use  . Smoking status: Never Smoker  . Smokeless tobacco: Never Used  Substance and Sexual Activity  . Alcohol use: No  . Drug use: No  . Sexual activity: Not on file  Other Topics Concern  . Not on file  Social History Narrative   Widowed   Lives in [place] to move in with daughter   2 daughters - 1 in [place]; 1 in [place]   6 grandchildren     PHYSICAL EXAM  Vitals:   06/07/17 0836  BP: 108/65  Pulse: 72  Weight: 139 lb 3.2 oz (63.1 kg)   Body mass index is 24.66 kg/m.  Generalized: Well developed, in no acute distress  Head: normocephalic and atraumatic,. Oropharynx benign  Neck: Supple, no carotid bruits  Cardiac:  Regular rate rhythm, no murmur  Musculoskeletal: No deformity   Neurological examination   Mentation: Alert oriented to time, place, history taking. Attention span and concentration appropriate. Recent and remote memory intact.  Follows all commands speech and language fluent.   Cranial nerve II-XII: Pupils were equal round reactive to light extraocular movements were full, visual field were full on confrontational test. Facial sensation and strength were normal. hearing was intact to finger rubbing bilaterally. Uvula tongue midline. head turning and shoulder shrug were normal and symmetric.Tongue protrusion into cheek strength was normal. Motor: normal bulk and tone, full strength in the BUE, BLE, Sensory: normal and symmetric to light touch, pinprick, and  Vibration,  in the upper and lower  extremities Coordination: finger-nose-finger, heel-to-shin bilaterally, no dysmetria Reflexes:  1+ upper lower and symmetric plantar responses were flexor bilaterally. Gait and Station: Rising up from seated position without assistance, normal stance,  moderate stride, good arm swing, smooth turning, able to perform tiptoe, and heel walking without difficulty. Tandem gait is mildly unsteady. No assistive device   DIAGNOSTIC DATA (LABS, IMAGING, TESTING) - I reviewed patient records, labs, notes, testing and imaging myself where available.  Lab Results  Component Value Date   WBC 5.1 10/12/2016   HGB 12.3 10/12/2016   HCT 37.4 10/12/2016   MCV 90.6 10/12/2016   PLT 233 10/12/2016      Component Value Date/Time   NA 137 10/13/2016 0350   K 3.9 10/13/2016 0350   CL 110 10/13/2016 0350   CO2 22 10/13/2016 0350   GLUCOSE 139 (H) 10/13/2016 0350   BUN 6 10/13/2016 0350   CREATININE 0.85 10/13/2016 0350   CALCIUM 8.7 (L) 10/13/2016 0350   GFRNONAA >60 10/13/2016 0350   GFRAA >60 10/13/2016 0350   Lab Results  Component Value Date   CHOL 164 10/12/2016   HDL 45 10/12/2016   LDLCALC 79  10/12/2016   TRIG 201 (H) 10/12/2016   CHOLHDL 3.6 10/12/2016   Lab Results  Component Value Date   HGBA1C 8.4 04/20/2017    Lab Results  Component Value Date   TSH 2.876 10/11/2016      ASSESSMENT AND PLAN  66 y.o. year old female  has a past medical history of Arrhythmia, Asthma, Diabetes mellitus, GERD (gastroesophageal reflux disease), and Stroke ([place]). here  for  follow-up for stroke.MRI of the brain right caudate lacunar infarct secondary to small vessel disease on 10/11/16. She has risk factors of hyperlipidemia and uncontrolled diabetes. The patient is a current patient of Dr. Erlinda Hong  who is out of the office today . This note is sent to the work in doctor.     PLAN: Stressed the importance of management of risk factors to prevent further stroke Continue aspirin 325 mg for secondary stroke prevention Maintain strict control of hypertension with blood pressure goal below 130/90, today's reading 108/65  Control of diabetes with hemoglobin A1c below 6.5 followed by primary care most recent hemoglobin A1c8.4 continue diabetic medications Cholesterol with LDL cholesterol less than 70, followed by primary care,   continue Lipitor Continue exercise at least 30 minutes daily   eat healthy diet with whole grains,  fresh fruits and vegetables Discharge from stroke clinic I spent 25 minutes in total face to face time with the patient more than 50% of which was spent counseling and coordination of care, reviewing test results reviewing medications and discussing and reviewing the diagnosis of stroke and management of risk factors particularly her uncontrolled diabetes Dennie Bible, [facility], [facility], APRN  [facility] [address] [place], [place] [address] [phone]

## 2017-06-07 ENCOUNTER — Ambulatory Visit (INDEPENDENT_AMBULATORY_CARE_PROVIDER_SITE_OTHER): Payer: Medicare Other | Admitting: Nurse Practitioner

## 2017-06-07 ENCOUNTER — Encounter: Payer: Self-pay | Admitting: Nurse Practitioner

## 2017-06-07 VITALS — BP 108/65 | HR 72 | Wt 139.2 lb

## 2017-06-07 DIAGNOSIS — E119 Type 2 diabetes mellitus without complications: Secondary | ICD-10-CM | POA: Diagnosis not present

## 2017-06-07 DIAGNOSIS — E785 Hyperlipidemia, unspecified: Secondary | ICD-10-CM | POA: Diagnosis not present

## 2017-06-07 DIAGNOSIS — I6381 Other cerebral infarction due to occlusion or stenosis of small artery: Secondary | ICD-10-CM | POA: Diagnosis not present

## 2017-06-07 DIAGNOSIS — I63311 Cerebral infarction due to thrombosis of right middle cerebral artery: Secondary | ICD-10-CM

## 2017-06-07 NOTE — Progress Notes (Signed)
I have read the note, and I agree with the clinical assessment and plan.  Richard A. Sater, MD, PhD, FAAN Certified in Neurology, Clinical Neurophysiology, Sleep Medicine, Pain Medicine and Neuroimaging  [facility] [address], [address] [place], [place] [address] [phone]  

## 2017-06-07 NOTE — Patient Instructions (Addendum)
Stressed the importance of management of risk factors to prevent further stroke Continue aspirin 325 mg for secondary stroke prevention Maintain strict control of hypertension with blood pressure goal below 130/90, today's reading 108/65  Control of diabetes with hemoglobin A1c below 6.5 followed by primary care most recent hemoglobin A1c8.4 continue diabetic medications Cholesterol with LDL cholesterol less than 70, followed by primary care,   continue Lipitor Continue exercise at least 30 minutes daily   eat healthy diet with whole grains,  fresh fruits and vegetables Discharge from stroke clinic Stroke Prevention Some medical conditions and behaviors are associated with an increased chance of having a stroke. You may prevent a stroke by making healthy choices and managing medical conditions. How can I reduce my risk of having a stroke?  Stay physically active. Get at least 30 minutes of activity on most or all days.  Do not smoke. It may also be helpful to avoid exposure to secondhand smoke.  Limit alcohol use. Moderate alcohol use is considered to be: ? No more than 2 drinks per day for men. ? No more than 1 drink per day for nonpregnant women.  Eat healthy foods. This involves: ? Eating 5 or more servings of fruits and vegetables a day. ? Making dietary changes that address high blood pressure (hypertension), high cholesterol, diabetes, or obesity.  Manage your cholesterol levels. ? Making food choices that are high in fiber and low in saturated fat, trans fat, and cholesterol may control cholesterol levels. ? Take any prescribed medicines to control cholesterol as directed by your health care provider.  Manage your diabetes. ? Controlling your carbohydrate and sugar intake is recommended to manage diabetes. ? Take any prescribed medicines to control diabetes as directed by your health care provider.  Control your hypertension. ? Making food choices that are low in salt  (sodium), saturated fat, trans fat, and cholesterol is recommended to manage hypertension. ? Ask your health care provider if you need treatment to lower your blood pressure. Take any prescribed medicines to control hypertension as directed by your health care provider. ? If you are 80-28 years of age, have your blood pressure checked every 3-5 years. If you are 69 years of age or older, have your blood pressure checked every year.  Maintain a healthy weight. ? Reducing calorie intake and making food choices that are low in sodium, saturated fat, trans fat, and cholesterol are recommended to manage weight.  Stop drug abuse.  Avoid taking birth control pills. ? Talk to your health care provider about the risks of taking birth control pills if you are over 13 years old, smoke, get migraines, or have ever had a blood clot.  Get evaluated for sleep disorders (sleep apnea). ? Talk to your health care provider about getting a sleep evaluation if you snore a lot or have excessive sleepiness.  Take medicines only as directed by your health care provider. ? For some people, aspirin or blood thinners (anticoagulants) are helpful in reducing the risk of forming abnormal blood clots that can lead to stroke. If you have the irregular heart rhythm of atrial fibrillation, you should be on a blood thinner unless there is a good reason you cannot take them. ? Understand all your medicine instructions.  Make sure that other conditions (such as anemia or atherosclerosis) are addressed. Get help right away if:  You have sudden weakness or numbness of the face, arm, or leg, especially on one side of the body.  Your face or  eyelid droops to one side.  You have sudden confusion.  You have trouble speaking (aphasia) or understanding.  You have sudden trouble seeing in one or both eyes.  You have sudden trouble walking.  You have dizziness.  You have a loss of balance or coordination.  You have a  sudden, severe headache with no known cause.  You have new chest pain or an irregular heartbeat. Any of these symptoms may represent a serious problem that is an emergency. Do not wait to see if the symptoms will go away. Get medical help at once. Call your local emergency services (911 in [place] Do not drive yourself to the hospital. This information is not intended to replace advice given to you by your health care provider. Make sure you discuss any questions you have with your health care provider. Document Released: 08/26/2004 Document Revised: 12/25/2015 Document Reviewed: 01/19/2013 Elsevier Interactive Patient Education  2017 [employer].

## 2017-06-10 ENCOUNTER — Other Ambulatory Visit: Payer: Self-pay | Admitting: Family Medicine

## 2017-06-16 ENCOUNTER — Other Ambulatory Visit: Payer: Self-pay | Admitting: Family Medicine

## 2017-06-16 DIAGNOSIS — K219 Gastro-esophageal reflux disease without esophagitis: Secondary | ICD-10-CM

## 2017-07-01 ENCOUNTER — Other Ambulatory Visit: Payer: Self-pay | Admitting: Family Medicine

## 2017-07-11 ENCOUNTER — Other Ambulatory Visit: Payer: Self-pay | Admitting: Family Medicine

## 2017-07-18 ENCOUNTER — Other Ambulatory Visit: Payer: Self-pay | Admitting: Family Medicine

## 2017-08-08 DIAGNOSIS — E039 Hypothyroidism, unspecified: Secondary | ICD-10-CM | POA: Diagnosis not present

## 2017-08-08 DIAGNOSIS — Z794 Long term (current) use of insulin: Secondary | ICD-10-CM | POA: Diagnosis not present

## 2017-08-08 DIAGNOSIS — I6930 Unspecified sequelae of cerebral infarction: Secondary | ICD-10-CM | POA: Diagnosis not present

## 2017-08-08 DIAGNOSIS — E1142 Type 2 diabetes mellitus with diabetic polyneuropathy: Secondary | ICD-10-CM | POA: Diagnosis not present

## 2017-08-08 DIAGNOSIS — E1165 Type 2 diabetes mellitus with hyperglycemia: Secondary | ICD-10-CM | POA: Diagnosis not present

## 2017-08-08 DIAGNOSIS — M8580 Other specified disorders of bone density and structure, unspecified site: Secondary | ICD-10-CM | POA: Diagnosis not present

## 2017-08-15 ENCOUNTER — Other Ambulatory Visit: Payer: Self-pay | Admitting: Family Medicine

## 2017-08-16 ENCOUNTER — Other Ambulatory Visit: Payer: Self-pay | Admitting: Family Medicine

## 2017-09-12 ENCOUNTER — Other Ambulatory Visit: Payer: Self-pay | Admitting: Family Medicine

## 2017-09-13 ENCOUNTER — Ambulatory Visit (INDEPENDENT_AMBULATORY_CARE_PROVIDER_SITE_OTHER): Payer: Medicare Other | Admitting: Internal Medicine

## 2017-09-13 ENCOUNTER — Other Ambulatory Visit: Payer: Self-pay

## 2017-09-13 ENCOUNTER — Encounter: Payer: Self-pay | Admitting: Internal Medicine

## 2017-09-13 VITALS — BP 124/70 | HR 88 | Temp 97.9°F | Ht 63.0 in | Wt 142.6 lb

## 2017-09-13 DIAGNOSIS — J329 Chronic sinusitis, unspecified: Secondary | ICD-10-CM

## 2017-09-13 DIAGNOSIS — B9689 Other specified bacterial agents as the cause of diseases classified elsewhere: Secondary | ICD-10-CM

## 2017-09-13 MED ORDER — ALBUTEROL SULFATE HFA 108 (90 BASE) MCG/ACT IN AERS: 2.0000 | INHALATION_SPRAY | Freq: Four times a day (QID) | RESPIRATORY_TRACT | 1 refills | Status: DC | PRN

## 2017-09-13 MED ORDER — BENZONATATE 100 MG PO CAPS: 200.0000 mg | ORAL_CAPSULE | Freq: Three times a day (TID) | ORAL | 0 refills | Status: DC | PRN

## 2017-09-13 MED ORDER — AMOXICILLIN-POT CLAVULANATE 875-125 MG PO TABS
1.0000 | ORAL_TABLET | Freq: Two times a day (BID) | ORAL | 0 refills | Status: DC
Start: 2017-09-13 — End: 2017-12-13

## 2017-09-13 NOTE — Progress Notes (Signed)
   Subjective:    Wendy Mcclain - 67 y.o. female MRN [id number]  Date of birth: [birth year]  HPI  CIAIRA NATIVIDAD is here for sinus congestion. Has been present >1 week with headaches and facial pressure. Significant rhinorrhea and sneezing occurring. Has worsened in the past few days and is now coughing and had fevers to 102-103 degrees over the weekend. Positive sick contact of grandson with similar symptoms. Has been using Albuterol inhaler and Tylenol at home.    -  reports that  has never smoked. she has never used smokeless tobacco. - Review of Systems: Per HPI. - Past Medical History: Patient Active Problem List   Diagnosis Date Noted  . Tension headache 04/20/2017  . Baker's cyst of knee, right 04/20/2017  . Grief reaction 04/20/2017  . Viral URI with cough 01/12/2017  . Left leg weakness   . Cerebrovascular accident (CVA) ([place])   . Hyperlipidemia   . OSA (obstructive sleep apnea)   . Left-sided weakness 10/11/2016  . Orthostatic hypotension 09/18/2015  . Chest pain 08/15/2015  . Acute sinusitis 08/19/2014  . Yeast infection 08/19/2014  . Vitamin D deficiency 05/20/2014  . Gastro-esophageal reflux 03/20/2014  . Allergic rhinitis 07/09/2013  . Sleep disorder 07/09/2013  . Elevated blood pressure (not hypertension) 07/09/2013  . Mood disorder ([place]) 11/23/2011  . Diabetes mellitus ([place]) 11/23/2011  . Health care maintenance 11/23/2011   - Medications: reviewed and updated   Objective:   Physical Exam BP 124/70   Pulse 88   Temp 97.9 F (36.6 C) (Oral)   Ht 5\' 3"  (1.6 m)   Wt 142 lb 9.6 oz (64.7 kg)   SpO2 99%   BMI 25.26 kg/m  Gen: NAD, alert, cooperative with exam, appears ill but non-toxic  HEENT: NCAT, PERRL, clear conjunctiva, oropharynx clear without tonsillar hypertrophy or exudates, supple neck, nasal turbinates markedly edematous bilaterally R >L, TTP over frontal sinuses R >L  CV: RRR, good S1/S2, no murmur Resp: diffuse rhonchi that clear with cough  and inspiration otherwise clear without wheezes, normal WOB, no retractions noted, normal RR     Assessment & Plan:   1. Bacterial sinusitis Most suspicious for bacterial sinusitis due to prolonged period of congestion with acute worsening concerning for secondary sickening. While lungs have rhonchi, it is non-focal and they clear easily so less suspicious for CAP. Also reassuring that O2 saturation is appropriate. Will treat presumed sinusitis with Augmentin. Tessalon given for cough and refill for albuterol sent. Return precautions discussed.  - amoxicillin-clavulanate (AUGMENTIN) 875-125 MG tablet; Take 1 tablet by mouth 2 (two) times daily.  Dispense: 20 tablet; Refill: 0 - benzonatate (TESSALON) 100 MG capsule; Take 2 capsules (200 mg total) by mouth 3 (three) times daily as needed for cough.  Dispense: 30 capsule; Refill: 0  Phill Myron, D.O. 09/13/2017, 12:04 PM PGY-3, [place]

## 2017-09-13 NOTE — Patient Instructions (Signed)
I have prescribed Augmentin, an antibiotic, to treat your bacterial sinusitis. Take this twice a day for 10 days. I have also sent in Tessalon for your cough. Albuterol has been sent through your mail order pharmacy. Please return if you don't improve with the antibiotic, if you continue to have fevers for >5 days total, or if you have trouble breathing despite the antibiotic.   Hope you feel better soon!   Dr. Juleen China

## 2017-09-15 ENCOUNTER — Other Ambulatory Visit: Payer: Self-pay | Admitting: Family Medicine

## 2017-09-21 ENCOUNTER — Telehealth: Payer: Self-pay

## 2017-09-21 NOTE — Telephone Encounter (Signed)
Patient left message on nurse line that she is on day 9 of her antibiotic course but still has significant congestion and a lot of cough. Thought she would feel better by now. Wants to know should she continue to wait it out or be seen again? Danley Danker, RN [facility] RN)

## 2017-09-21 NOTE — Telephone Encounter (Signed)
Patient should be seen again for assessment. Please schedule her an appointment.   Phill Myron, D.O. 09/21/2017, 8:39 PM PGY-3, [place]

## 2017-09-22 DIAGNOSIS — Z794 Long term (current) use of insulin: Secondary | ICD-10-CM | POA: Diagnosis not present

## 2017-09-22 DIAGNOSIS — E119 Type 2 diabetes mellitus without complications: Secondary | ICD-10-CM | POA: Diagnosis not present

## 2017-09-22 NOTE — Telephone Encounter (Signed)
Pt seeing Dr. Burr Medico 09/23/2017. Ottis Stain, CMA

## 2017-09-23 ENCOUNTER — Ambulatory Visit: Payer: Medicare Other | Admitting: Student in an Organized Health Care Education/Training Program

## 2017-10-10 ENCOUNTER — Other Ambulatory Visit: Payer: Self-pay | Admitting: Family Medicine

## 2017-10-19 ENCOUNTER — Other Ambulatory Visit: Payer: Self-pay

## 2017-10-19 ENCOUNTER — Other Ambulatory Visit: Payer: Self-pay | Admitting: Family Medicine

## 2017-10-19 MED ORDER — ATORVASTATIN CALCIUM 40 MG PO TABS: ORAL_TABLET | ORAL | 4 refills | Status: DC

## 2017-10-19 NOTE — Telephone Encounter (Signed)
Pt called nurse line requesting refill of atorvastatin sent to [place] call back [phone] Wallace Cullens, RN

## 2017-11-09 ENCOUNTER — Other Ambulatory Visit: Payer: Self-pay | Admitting: Family Medicine

## 2017-11-16 ENCOUNTER — Other Ambulatory Visit: Payer: Self-pay | Admitting: Family Medicine

## 2017-11-30 DIAGNOSIS — I6930 Unspecified sequelae of cerebral infarction: Secondary | ICD-10-CM | POA: Diagnosis not present

## 2017-11-30 DIAGNOSIS — M8580 Other specified disorders of bone density and structure, unspecified site: Secondary | ICD-10-CM | POA: Diagnosis not present

## 2017-11-30 DIAGNOSIS — Z794 Long term (current) use of insulin: Secondary | ICD-10-CM | POA: Diagnosis not present

## 2017-11-30 DIAGNOSIS — E039 Hypothyroidism, unspecified: Secondary | ICD-10-CM | POA: Diagnosis not present

## 2017-11-30 DIAGNOSIS — E1142 Type 2 diabetes mellitus with diabetic polyneuropathy: Secondary | ICD-10-CM | POA: Diagnosis not present

## 2017-11-30 DIAGNOSIS — E1165 Type 2 diabetes mellitus with hyperglycemia: Secondary | ICD-10-CM | POA: Diagnosis not present

## 2017-12-07 ENCOUNTER — Other Ambulatory Visit: Payer: Self-pay | Admitting: Family Medicine

## 2017-12-12 ENCOUNTER — Ambulatory Visit (INDEPENDENT_AMBULATORY_CARE_PROVIDER_SITE_OTHER): Payer: Medicare Other | Admitting: Internal Medicine

## 2017-12-12 ENCOUNTER — Encounter: Payer: Self-pay | Admitting: Internal Medicine

## 2017-12-12 ENCOUNTER — Other Ambulatory Visit: Payer: Self-pay

## 2017-12-12 VITALS — BP 112/62 | HR 87 | Temp 97.9°F | Ht 63.0 in | Wt 141.8 lb

## 2017-12-12 DIAGNOSIS — S30861A Insect bite (nonvenomous) of abdominal wall, initial encounter: Secondary | ICD-10-CM

## 2017-12-12 DIAGNOSIS — W57XXXA Bitten or stung by nonvenomous insect and other nonvenomous arthropods, initial encounter: Secondary | ICD-10-CM

## 2017-12-12 MED ORDER — DOXYCYCLINE HYCLATE 100 MG PO TABS: 100.0000 mg | ORAL_TABLET | Freq: Two times a day (BID) | ORAL | 0 refills | Status: AC

## 2017-12-12 NOTE — Progress Notes (Signed)
[facility] Progress Note  Subjective:  Wendy Mcclain is a 67 y.o. female with history of diabetes, asthma, CVA, and GERD who presents after tick bite. She reports finding the tick about 2 days after taking her grandson fishing in the woods. Bite occurred last week. She removed it and thinks she got the whole thing. She shows a picture of a small dark tick; thinks it was a deer tick. She has been cleansing the area with alcohol but has had a rash that has increased in size. She reports initially seeing a clearing within the red. She has had fevers and chills, headache, and some abdominal upset, so she thought she should come in. ROS: No joint aches, no confusion  Allergies  Allergen Reactions  . Metformin And Related Diarrhea    Social History   Tobacco Use  . Smoking status: Never Smoker  . Smokeless tobacco: Never Used  Substance Use Topics  . Alcohol use: No    Objective: Blood pressure 112/62, pulse 87, temperature 97.9 F (36.6 C), temperature source Oral, height 5\' 3"  (1.6 m), weight 141 lb 12.8 oz (64.3 kg), SpO2 97 %. Body mass index is 25.12 kg/m. Constitutional: Well-appearing female in NAD Pulmonary/Chest: Effort normal  Neurological: AOx3, no focal deficits. Skin: Erythematous circular patch with darker central crust, measuring about 3x2.5 cm, on right lower abdomen Psychiatric: Normal mood and affect.  Vitals reviewed  Assessment/Plan: Tick bite of abdomen - Patient with tick present > 36 hours, rash, and reported consitutional symptoms. Rash may be erythema migrans vs irritant dermatitis from tick saliva vs mild cellulitis. Lyme disease unlikely given incidence map, but discussed duration of treatment options and patient prefers to air on side of caution with 3 week course. Other possible tick-borne illness include ehrlichiosis and RMSF--both of which would respond to doxycycline.   Meds ordered this encounter  Medications  . doxycycline  (VIBRA-TABS) 100 MG tablet    Sig: Take 1 tablet (100 mg total) by mouth 2 (two) times daily for 21 days.    Dispense:  42 tablet    Refill:  0  - Counseled patient to return if rash drastically enlarges or does not improve or if she has documented fever   Follow-up prn.  Olene Floss, MD [place], PGY-3

## 2017-12-12 NOTE — Patient Instructions (Signed)
Wendy Mcclain,  I am treating you for possible lyme's disease. Please take doxycyline twice daily for 21 days. If you have fever or rash is spreading significantly, please see [place] back.  Best, Dr. Eino Farber Bite Information, Adult Ticks are insects that can bite. Most ticks live in shrubs and grassy areas. They climb onto people and animals that go by. Then they bite. Some ticks carry germs that can make you sick. How can I prevent tick bites?  Use an insect repellent that has 20% or higher of the ingredients DEET, picaridin, or IR3535. Put this insect repellent on: ? Bare skin. ? The tops of your boots. ? Your pant legs. ? The ends of your sleeves.  If you use an insect repellent that has the ingredient permethrin, make sure to follow the instructions on the bottle. Treat the following: ? Clothing. ? Supplies. ? Boots. ? Tents.  Wear long sleeves, long pants, and light colors.  Tuck your pant legs into your socks.  Stay in the middle of the trail.  Try not to walk through long grass.  Before going inside your house, check your clothes, hair, and skin for ticks. Make sure to check your head, neck, armpits, waist, groin, and joint areas.  Check for ticks every day.  When you come indoors: ? Wash your clothes right away. ? Shower right away. ? Dry your clothes in a dryer on high heat for 60 minutes or more. What is the right way to remove a tick? Remove a tick from your skin as soon as possible.  To remove a tick that is crawling on your skin: ? Go outdoors and brush the tick off. ? Use tape or a lint roller.  To remove a tick that is biting: ? Wash your hands. ? If you have latex gloves, put them on. ? Use tweezers, curved forceps, or a tick-removal tool to grasp the tick. Grasp the tick as close to your skin and as close to the tick's head as possible. ? Gently pull up until the tick lets go.  Try to keep the tick's head attached to its body.  Do not twist  or jerk the tick.  Do not squeeze or crush the tick.  Do not try to remove a tick with heat, alcohol, petroleum jelly, or fingernail polish. How should I get rid of a tick? Here are some ways to get rid of a tick that is alive:  Place the tick in rubbing alcohol.  Place the tick in a bag or container you can close tightly.  Wrap the tick tightly in tape.  Flush the tick down the toilet.  Contact a doctor if:  You have symptoms of a disease, such as: ? Pain in a muscle, joint, or bone. ? Trouble walking or moving your legs. ? Numbness in your legs. ? Inability to move (paralysis). ? A red rash that makes a circle (bull's-eye rash). ? Redness and swelling where the tick bit you. ? A fever. ? Throwing up (vomiting) over and over. ? Diarrhea. ? Weight loss. ? Tender and swollen lymph glands. ? Shortness of breath. ? Cough. ? Belly pain (abdominal pain). ? Headache. ? Being more tired than normal. ? A change in how alert (conscious) you are. ? Confusion. Get help right away if:  You cannot remove a tick.  A part of a tick breaks off and gets stuck in your skin.  You are feeling worse. Summary  Ticks may carry  germs that can make you sick.  To prevent tick bites, wear long sleeves, long pants, and light colors. Use insect repellent. Follow the instructions on the bottle.  If the tick is biting, do not try to remove it with heat, alcohol, petroleum jelly, or fingernail polish.  Use tweezers, curved forceps, or a tick-removal tool to grasp the tick. Gently pull up until the tick lets go. Do not twist or jerk the tick. Do not squeeze or crush the tick.  If you have symptoms, contact a doctor. This information is not intended to replace advice given to you by your health care provider. Make sure you discuss any questions you have with your health care provider. Document Released: 10/13/2009 Document Revised: 10/29/2016 Document Reviewed: 10/29/2016 Elsevier Interactive  Patient Education  2018 [employer].

## 2017-12-13 ENCOUNTER — Encounter: Payer: Self-pay | Admitting: Internal Medicine

## 2017-12-13 DIAGNOSIS — S30861A Insect bite (nonvenomous) of abdominal wall, initial encounter: Secondary | ICD-10-CM | POA: Insufficient documentation

## 2017-12-13 DIAGNOSIS — W57XXXA Bitten or stung by nonvenomous insect and other nonvenomous arthropods, initial encounter: Secondary | ICD-10-CM | POA: Insufficient documentation

## 2017-12-13 NOTE — Assessment & Plan Note (Signed)
-   Patient with tick present > 36 hours, rash, and reported consitutional symptoms. Rash may be erythema migrans vs irritant dermatitis from tick saliva vs mild cellulitis. Lyme disease unlikely given incidence map, but discussed duration of treatment options and patient prefers to air on side of caution with 3 week course. Other possible tick-borne illness include ehrlichiosis and RMSF--both of which would respond to doxycycline.   Meds ordered this encounter  Medications  . doxycycline (VIBRA-TABS) 100 MG tablet    Sig: Take 1 tablet (100 mg total) by mouth 2 (two) times daily for 21 days.    Dispense:  42 tablet    Refill:  0  - Counseled patient to return if rash drastically enlarges or does not improve or if she has documented fever

## 2017-12-15 ENCOUNTER — Other Ambulatory Visit: Payer: Self-pay | Admitting: Family Medicine

## 2017-12-27 ENCOUNTER — Telehealth: Payer: Self-pay

## 2017-12-27 NOTE — Telephone Encounter (Signed)
Pt informed. Wendy [facility]ain, CMA  

## 2017-12-27 NOTE — Telephone Encounter (Signed)
Patient left message on nurse line that she has six days left of doxycycline given for tick bite. States bite site is smaller and lighter. Calling because she developed a "god awful" cold at the end of last week and according to pamphlet with Doxycycline this is listed as a side effect. Wants to know if she should finish the entire course because of this.  Call back is [phone]  Danley Danker, RN [facility])

## 2017-12-27 NOTE — Telephone Encounter (Signed)
Consulted with Dr. Ola Spurr who saw the patient. Recommend continuing course of antibiotics. Red team please inform patient. Thank you.   Smitty Cords, MD [place], PGY-3

## 2018-01-13 ENCOUNTER — Other Ambulatory Visit: Payer: Self-pay | Admitting: Family Medicine

## 2018-01-15 ENCOUNTER — Other Ambulatory Visit: Payer: Self-pay | Admitting: Family Medicine

## 2018-01-30 ENCOUNTER — Encounter: Payer: Self-pay | Admitting: Family Medicine

## 2018-01-30 ENCOUNTER — Ambulatory Visit (INDEPENDENT_AMBULATORY_CARE_PROVIDER_SITE_OTHER): Payer: Medicare Other | Admitting: Family Medicine

## 2018-01-30 ENCOUNTER — Other Ambulatory Visit: Payer: Self-pay

## 2018-01-30 VITALS — BP 118/62 | HR 77 | Temp 97.9°F | Ht 63.0 in | Wt 140.8 lb

## 2018-01-30 DIAGNOSIS — R05 Cough: Secondary | ICD-10-CM

## 2018-01-30 DIAGNOSIS — E119 Type 2 diabetes mellitus without complications: Secondary | ICD-10-CM | POA: Diagnosis not present

## 2018-01-30 DIAGNOSIS — R058 Other specified cough: Secondary | ICD-10-CM

## 2018-01-30 LAB — POCT GLYCOSYLATED HEMOGLOBIN (HGB A1C): HbA1c, POC (controlled diabetic range): 8.8 % — AB (ref 0.0–7.0)

## 2018-01-30 NOTE — Patient Instructions (Signed)
It was a pleasure to see you today! Thank you for choosing [facility] for your primary care. Wendy Mcclain was seen for cough, form.   Our plans for today were:  Please try warm honey and flonase for your cough and drainage. Come back if things persist or you feel your cough is getting worse.  We will work on your form and send it to the life insurance policy via fax.   Best,  Dr. Lindell Noe

## 2018-01-30 NOTE — Progress Notes (Signed)
a 

## 2018-01-30 NOTE — Progress Notes (Signed)
   CC:   HPI  Cough - had a cold while on doxy for tick bite. Lingering cough after this. Worse in the morning and at night. No lung issues. No fevers. No SOB or DOE.   DM - has has some lows. Sees Endocrinology.   ROS: Denies CP, SOB, abdominal pain, dysuria, changes in BMs.   CC, SH/smoking status, and VS noted  Objective: BP 118/62   Pulse 77   Temp 97.9 F (36.6 C) (Oral)   Ht 5\' 3"  (1.6 m)   Wt 140 lb 12.8 oz (63.9 kg)   SpO2 98%   BMI 24.94 kg/m  Gen: NAD, alert, cooperative, and pleasant. HEENT: NCAT, EOMI, PERRL CV: RRR, no murmur Resp: CTAB, no wheezes, non-labored Ext: No edema, warm Neuro: Alert and oriented, Speech clear, No gross deficits  Assessment and plan:  Cough: lung exam completely clear, likely post viral cough. Counseled that this is not infectious, may last up to 6 weeks. Since she is already on antihistamine, will try honey for cough as well as flonase for possible post nasal drip component (she has this at home already).   DM - Hgbac1c obtained, but patient sees endo. She states this helpful and she will let them know that it was elevated.   Orders Placed This Encounter  Procedures  . HgB A1c    Ralene Ok, MD, PGY3 01/31/2018 11:17 AM

## 2018-01-31 ENCOUNTER — Telehealth: Payer: Self-pay

## 2018-01-31 NOTE — Telephone Encounter (Signed)
Patient calling requesting ear patches for sea sickness #4 be sent to pharmacy. Needs for boat.  Call back is [phone]  Danley Danker, RN [facility] [place])

## 2018-02-08 ENCOUNTER — Other Ambulatory Visit: Payer: Self-pay | Admitting: Family Medicine

## 2018-02-08 MED ORDER — SCOPOLAMINE 1 MG/3DAYS TD PT72: 1.0000 | MEDICATED_PATCH | TRANSDERMAL | 1 refills | Status: DC

## 2018-02-08 NOTE — Telephone Encounter (Signed)
I must have missed this message when it was sent earlier.  I sent in scopolamine patches for Wendy Mcclain tonight.  Thanks!

## 2018-02-13 ENCOUNTER — Other Ambulatory Visit: Payer: Self-pay | Admitting: Family Medicine

## 2018-03-13 ENCOUNTER — Other Ambulatory Visit: Payer: Self-pay | Admitting: Family Medicine

## 2018-03-26 ENCOUNTER — Other Ambulatory Visit: Payer: Self-pay | Admitting: Family Medicine

## 2018-03-26 DIAGNOSIS — K219 Gastro-esophageal reflux disease without esophagitis: Secondary | ICD-10-CM

## 2018-04-16 ENCOUNTER — Other Ambulatory Visit: Payer: Self-pay | Admitting: Family Medicine

## 2018-04-21 ENCOUNTER — Other Ambulatory Visit: Payer: Self-pay | Admitting: Family Medicine

## 2018-04-21 DIAGNOSIS — K219 Gastro-esophageal reflux disease without esophagitis: Secondary | ICD-10-CM

## 2018-05-01 DIAGNOSIS — E119 Type 2 diabetes mellitus without complications: Secondary | ICD-10-CM | POA: Diagnosis not present

## 2018-05-01 DIAGNOSIS — Z961 Presence of intraocular lens: Secondary | ICD-10-CM | POA: Diagnosis not present

## 2018-05-03 DIAGNOSIS — R5383 Other fatigue: Secondary | ICD-10-CM | POA: Diagnosis not present

## 2018-05-03 DIAGNOSIS — M810 Age-related osteoporosis without current pathological fracture: Secondary | ICD-10-CM | POA: Diagnosis not present

## 2018-05-03 DIAGNOSIS — E1165 Type 2 diabetes mellitus with hyperglycemia: Secondary | ICD-10-CM | POA: Diagnosis not present

## 2018-05-03 DIAGNOSIS — Z794 Long term (current) use of insulin: Secondary | ICD-10-CM | POA: Diagnosis not present

## 2018-05-03 DIAGNOSIS — I6930 Unspecified sequelae of cerebral infarction: Secondary | ICD-10-CM | POA: Diagnosis not present

## 2018-05-03 DIAGNOSIS — E1142 Type 2 diabetes mellitus with diabetic polyneuropathy: Secondary | ICD-10-CM | POA: Diagnosis not present

## 2018-05-03 DIAGNOSIS — E039 Hypothyroidism, unspecified: Secondary | ICD-10-CM | POA: Diagnosis not present

## 2018-05-15 ENCOUNTER — Other Ambulatory Visit: Payer: Self-pay | Admitting: Family Medicine

## 2018-06-13 ENCOUNTER — Ambulatory Visit (INDEPENDENT_AMBULATORY_CARE_PROVIDER_SITE_OTHER): Payer: Medicare Other | Admitting: Family Medicine

## 2018-06-13 ENCOUNTER — Other Ambulatory Visit: Payer: Self-pay

## 2018-06-13 ENCOUNTER — Ambulatory Visit (INDEPENDENT_AMBULATORY_CARE_PROVIDER_SITE_OTHER): Payer: Medicare Other | Admitting: Licensed Clinical Social Worker

## 2018-06-13 VITALS — BP 122/82 | HR 99 | Temp 98.3°F | Ht 63.0 in | Wt 141.0 lb

## 2018-06-13 DIAGNOSIS — F411 Generalized anxiety disorder: Secondary | ICD-10-CM | POA: Diagnosis not present

## 2018-06-13 DIAGNOSIS — F410 Panic disorder [episodic paroxysmal anxiety] without agoraphobia: Secondary | ICD-10-CM | POA: Diagnosis not present

## 2018-06-13 DIAGNOSIS — J069 Acute upper respiratory infection, unspecified: Secondary | ICD-10-CM | POA: Diagnosis not present

## 2018-06-13 DIAGNOSIS — B9789 Other viral agents as the cause of diseases classified elsewhere: Secondary | ICD-10-CM | POA: Diagnosis not present

## 2018-06-13 NOTE — BH Specialist Note (Signed)
Integrated Behavioral Health Initial Visit  MRN: [id number] Name: GINNETTE Mcclain  Session Start time: 3:15  Session End time: 3:35 Total time: 20 minutes Type of Service: Integrated Behavioral Health   Warm Hand Off Completed.     Patient verbally consented to meet with [facility] Consultant about presenting concerns. SUBJECTIVE: Wendy Mcclain is a 67 y.o. female referred by Dr. Criss Rosales to assist patient with coping strategies for managing panic attacks.  ASSESSMENT: Patient is pleasant and engaged in conversation she is currently experiencing symptoms of panic attack which are exacerbated by life transition stressors.  Patient may benefit from and is in agreement to utilize brief therapeutic interventions to assist with managing her symptoms.  GOALS: Patient will: 1. Reduce symptoms of: panic 2. Increase knowledge and/or ability of: coping skills and self-management skills PLAN:   1. Patient will Implement relaxed breathing 3 times a day 2. Per provider he has placed referral to psychology  _______________________________________________________  Duration of CURRENT symptoms: started about 1 ago and has progressed in the past week. Patient enjoys watching TV and noticed symptoms start when she is driving or just sitting at home. Patient has strong faith and prays a lot when experiencing panic symptoms.   INTERVENTION:  Mindfulness or Psychologist, educational and Supportive Counseling, psycho-education      Casimer Lanius, [place]   [phone] 3:43 PM

## 2018-06-13 NOTE — Patient Instructions (Signed)
It was a pleasure to see you today! Thank you for choosing [facility] for your primary care. Wendy Mcclain was seen for panic attacks and cough. Come back to the clinic if you have any routine concerns, and go to the emergency room if you have any life threatening problems.  Today we've connected you with our social worker to discuss some techniques to cope with panic attacks.  We're also referring you to psychology so you can establish care for mental health.  If you still feel you need medication after meeting with psychology, please schedule and appt with your primary care doctor to discuss it.   Please bring all your medications to every doctors visit   Sign up for My Chart to have easy access to your labs results, and communication with your Primary care physician.     Please check-out at the front desk before leaving the clinic.     Best,  Dr. Sherene Sires FAMILY MEDICINE RESIDENT - PGY2 06/13/2018 3:22 PM

## 2018-06-15 DIAGNOSIS — J209 Acute bronchitis, unspecified: Secondary | ICD-10-CM | POA: Diagnosis not present

## 2018-06-19 DIAGNOSIS — F411 Generalized anxiety disorder: Secondary | ICD-10-CM

## 2018-06-19 HISTORY — DX: Generalized anxiety disorder: F41.1

## 2018-06-19 NOTE — Assessment & Plan Note (Signed)
Post viral cough continuing but very mild.  No fever/productivity/SOB/loc/nausea/vomiting.  No ear pain/sinusitis/throat edema/LAD  Continue over the counter supportive measure and return precautions given

## 2018-06-19 NOTE — Assessment & Plan Note (Addendum)
Patient with no SI/HI.  Having anxiety concerns related to [place] and [place] of family member and recent stroke.  No SI/HI, no substance abuse reported.  Warm handoff to [facility] for anxiety/calming techniques and amb referral to psych

## 2018-06-19 NOTE — Progress Notes (Signed)
    Subjective:  Wendy Mcclain is a 67 y.o. female who presents to the [facility] today with a chief complaint of anxiety.   HPI: Patient with recent loss of husband and stroke c/o anxiety.  She [place] to [place] down and establish a home life but it has been hard emotionally.  She denies si/hi. Substance use.  Does not have a current [facility] provider but would like to get one.   Objective:  Physical Exam: BP 122/82   Pulse 99   Temp 98.3 F (36.8 C) (Oral)   Ht 5\' 3"  (1.6 m)   Wt 141 lb (64 kg)   SpO2 99%   BMI 24.98 kg/m   Gen: NAD, resting comfortably CV: RRR with no murmurs appreciated Pulm: NWOB, CTAB with no crackles, wheezes, or rhonchi GI: Normal bowel sounds present. Soft, Nontender, Nondistended. MSK: no edema, cyanosis, or clubbing noted, chronic left sided weakness (s/p stroke) Skin: warm, dry Neuro: grossly normal, moves all extremities Psych: Normal affect and thought content  No results found for this or any previous visit (from the past 72 hour(s)).   Assessment/Plan:  Anxiety state Patient with no SI/HI.  Having anxiety concerns related to travel back and [place] and recent loss of family member and recent stroke.  No SI/HI, no substance abuse reported.  Warm handoff to [facility] for anxiety/calming techniques and amb referral to psych  Viral URI with cough Post viral cough continuing but very mild.  No fever/productivity/SOB/loc/nausea/vomiting.  No ear pain/sinusitis/throat edema/LAD  Continue over the counter supportive measure and return precautions given   Sherene Sires, [place] - PGY2 06/19/2018 9:19 PM

## 2018-07-02 DIAGNOSIS — J069 Acute upper respiratory infection, unspecified: Secondary | ICD-10-CM | POA: Diagnosis not present

## 2018-07-02 DIAGNOSIS — Z6824 Body mass index (BMI) 24.0-24.9, adult: Secondary | ICD-10-CM | POA: Diagnosis not present

## 2018-07-02 DIAGNOSIS — J029 Acute pharyngitis, unspecified: Secondary | ICD-10-CM | POA: Diagnosis not present

## 2018-07-14 ENCOUNTER — Encounter: Payer: Self-pay | Admitting: Cardiovascular Disease

## 2018-08-06 IMAGING — CT CT HEAD W/O CM
4 series · 16 of 47 positions shown, 18 images · non-contrast
Comparison: 09/18/2011

CLINICAL DATA: Left side weakness.  Headache

EXAM:
CT HEAD WITHOUT CONTRAST
TECHNIQUE: Contiguous axial images were obtained from the base of the skull
through the vertex without intravenous contrast.

[Series 2: head without · axial · non-contrast · 0.45mm/px · z∈[-232,-102]mm · 7 of 36 slices shown, 9 images]
[im 5/36  brain]
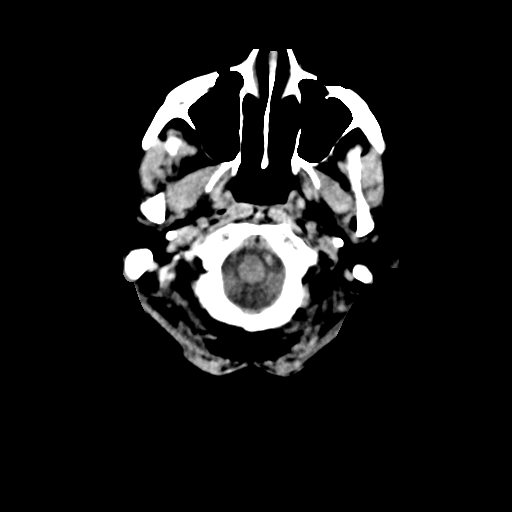
[im 5/36  bone]
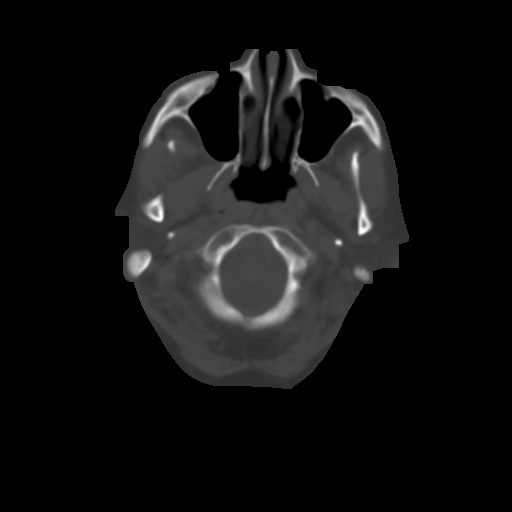
[im 9/36  brain]
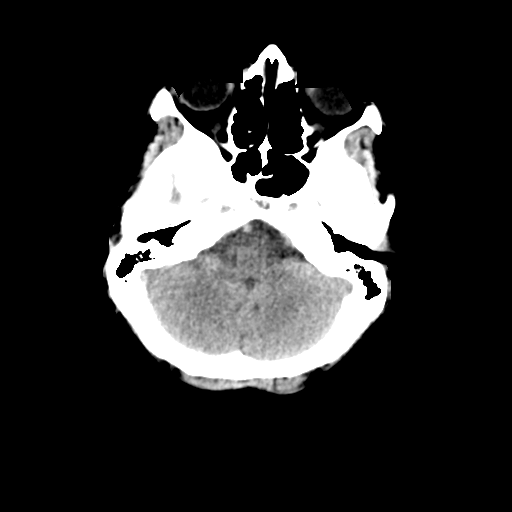
[im 14/36  brain]
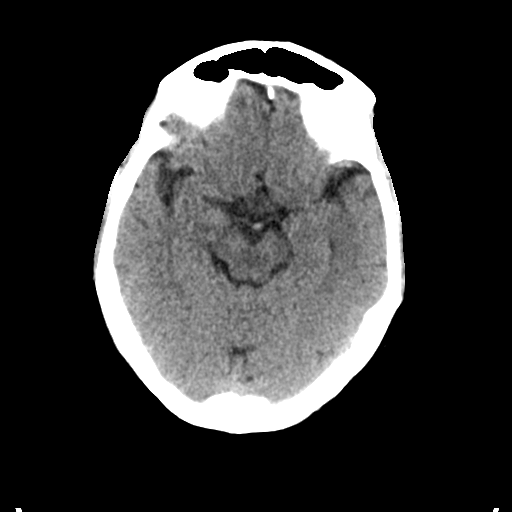
[im 18/36  brain]
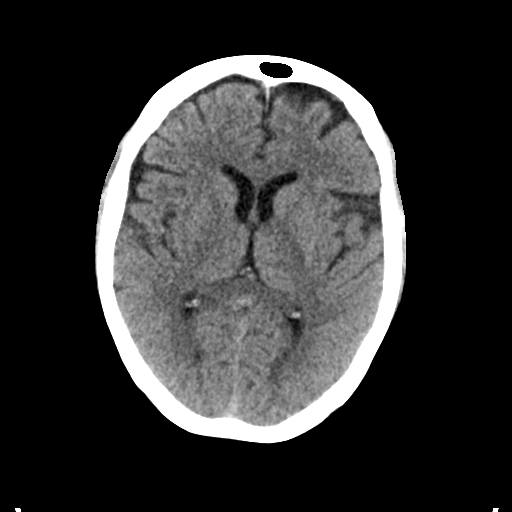
[im 22/36  brain]
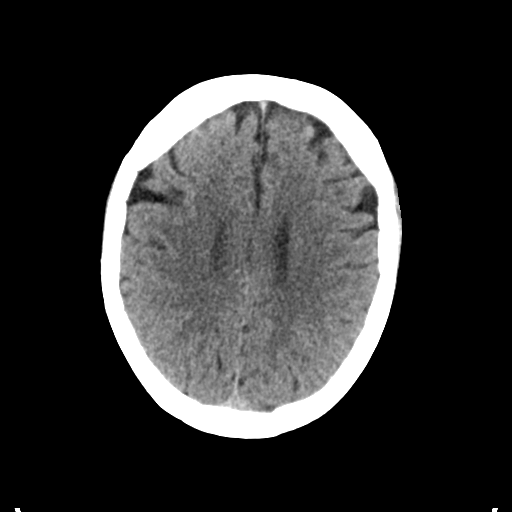
[im 22/36  bone]
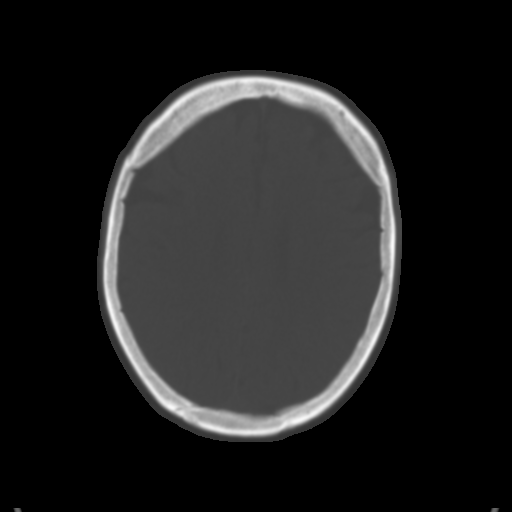
[im 27/36  brain]
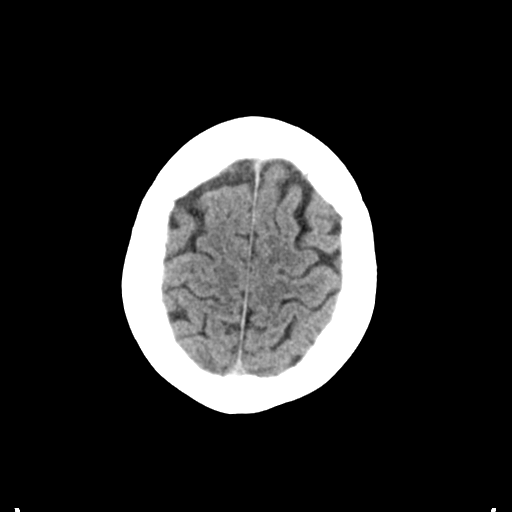
[im 31/36  brain]
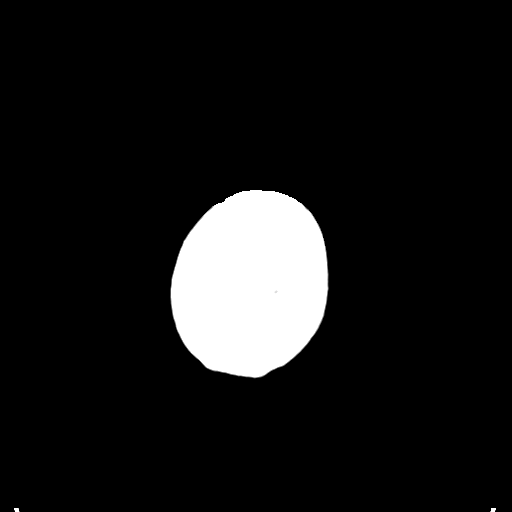

[Series 3: head bone · axial · 0.45mm/px · z∈[-236,-200]mm · 3 of 89 slices shown]
[im 9/89  bone]
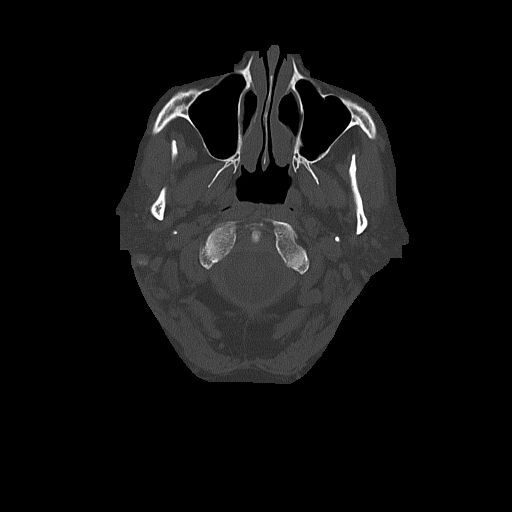
[im 18/89  bone]
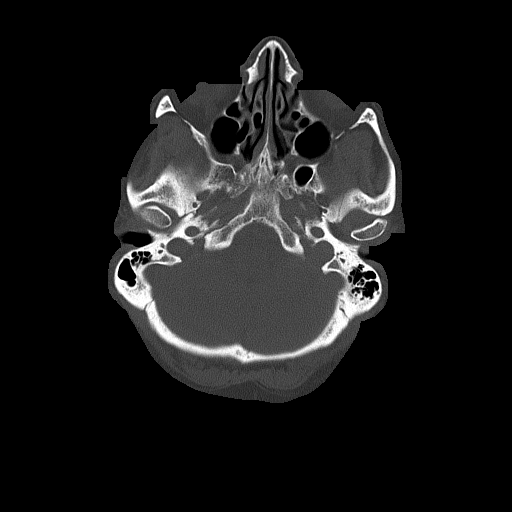
[im 27/89  bone]
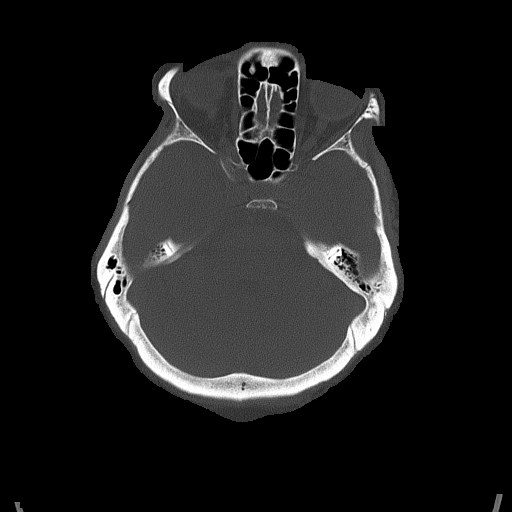

[Series 4: head without cor · coronal · non-contrast · 0.38mm/px · 3 of 64 slices shown]
[im 22/64  brain]
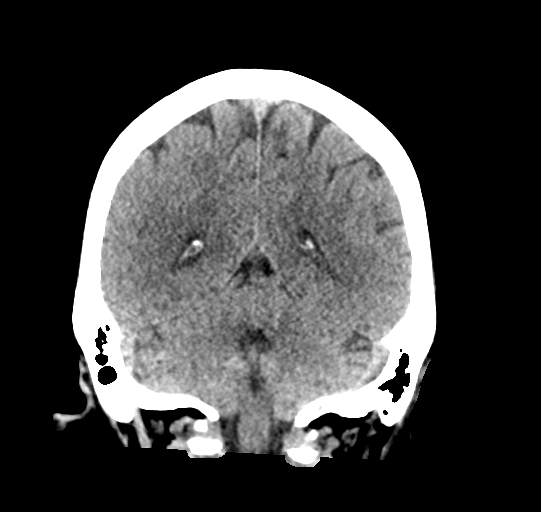
[im 29/64  brain]
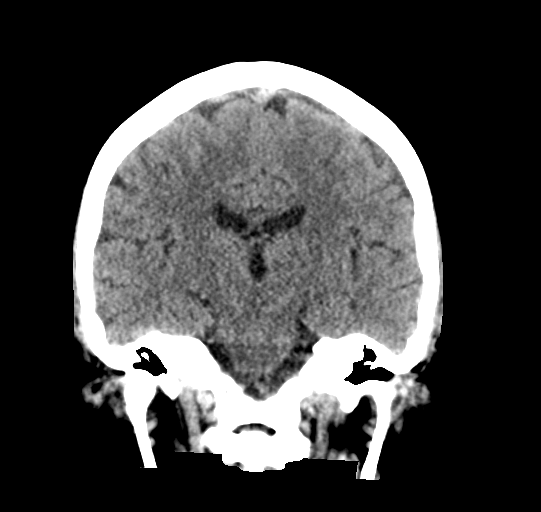
[im 36/64  brain]
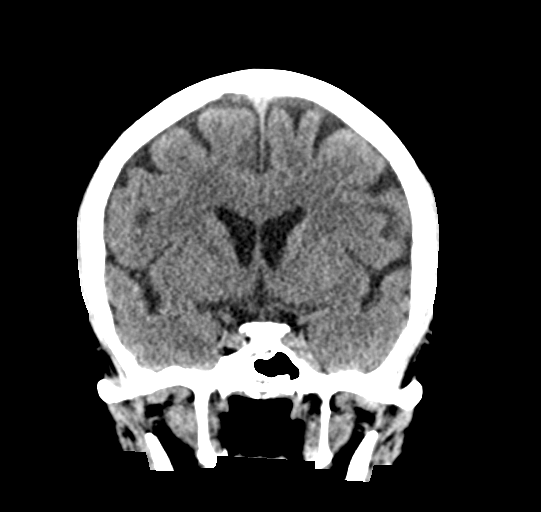

[Series 5: head without sag · sagittal · non-contrast · 0.35mm/px · 3 of 67 slices shown]
[im 23/67  brain]
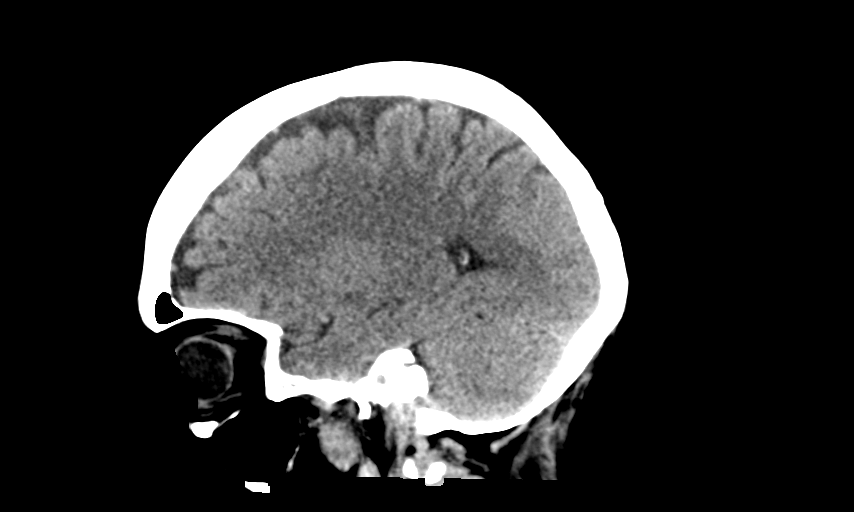
[im 34/67  brain]
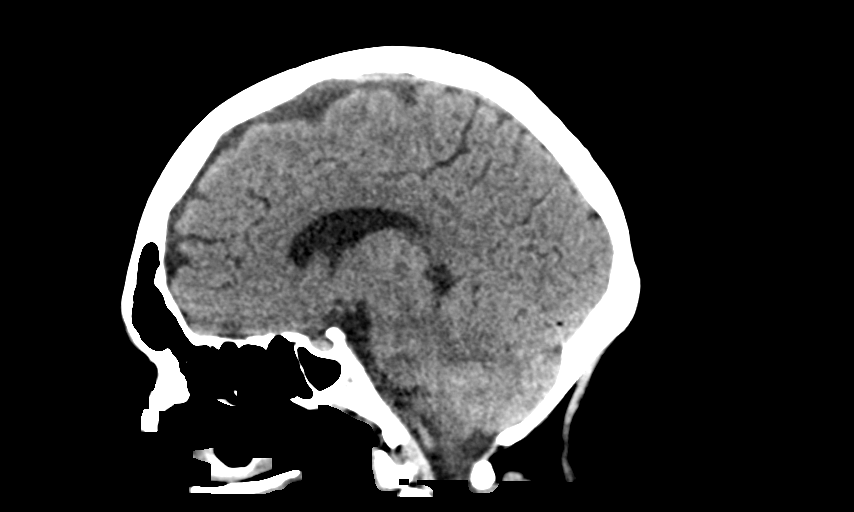
[im 45/67  brain]
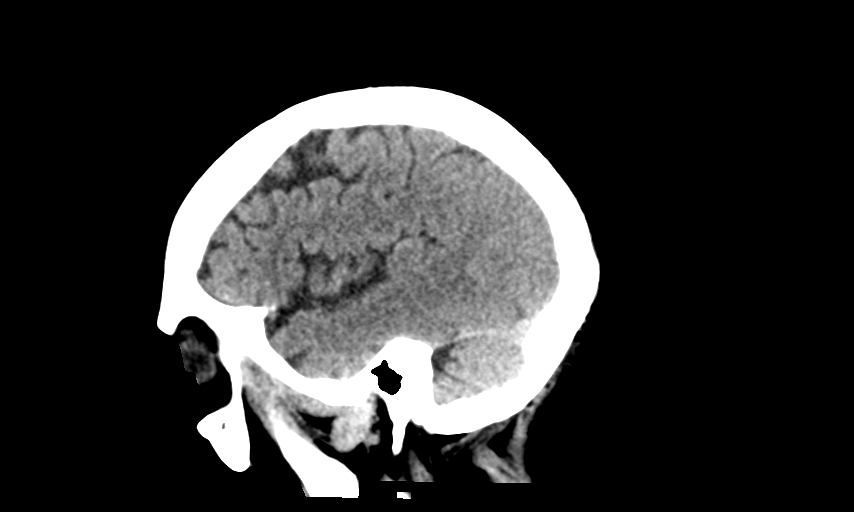

[16 of 47 positions shown; findings below may reference images not displayed]

FINDINGS: Brain: No acute intracranial abnormality. Specifically, no
hemorrhage, hydrocephalus, mass lesion, acute infarction, or
significant intracranial injury.

Vascular: No hyperdense vessel or unexpected calcification.

Skull: No acute calvarial abnormality.

Sinuses/Orbits: Visualized paranasal sinuses and mastoids clear.
Orbital soft tissues unremarkable.

Other: None
IMPRESSION: No intracranial abnormality.

## 2018-08-09 ENCOUNTER — Ambulatory Visit: Payer: Medicare Other | Admitting: Cardiovascular Disease

## 2018-09-28 ENCOUNTER — Telehealth: Payer: Self-pay | Admitting: *Deleted

## 2018-09-28 ENCOUNTER — Encounter (HOSPITAL_BASED_OUTPATIENT_CLINIC_OR_DEPARTMENT_OTHER): Payer: Self-pay

## 2018-09-28 ENCOUNTER — Emergency Department (HOSPITAL_BASED_OUTPATIENT_CLINIC_OR_DEPARTMENT_OTHER): Payer: Medicare Other

## 2018-09-28 ENCOUNTER — Other Ambulatory Visit: Payer: Self-pay

## 2018-09-28 ENCOUNTER — Observation Stay (HOSPITAL_BASED_OUTPATIENT_CLINIC_OR_DEPARTMENT_OTHER)
Admission: EM | Admit: 2018-09-28 | Discharge: 2018-09-29 | Disposition: A | Discharge status: Home / Self Care | Payer: Medicare Other | Attending: Family Medicine | Admitting: Family Medicine

## 2018-09-28 DIAGNOSIS — M6281 Muscle weakness (generalized): Secondary | ICD-10-CM | POA: Diagnosis not present

## 2018-09-28 DIAGNOSIS — Z888 Allergy status to other drugs, medicaments and biological substances status: Secondary | ICD-10-CM | POA: Diagnosis not present

## 2018-09-28 DIAGNOSIS — Z7982 Long term (current) use of aspirin: Secondary | ICD-10-CM | POA: Diagnosis not present

## 2018-09-28 DIAGNOSIS — R202 Paresthesia of skin: Secondary | ICD-10-CM | POA: Diagnosis not present

## 2018-09-28 DIAGNOSIS — R29898 Other symptoms and signs involving the musculoskeletal system: Secondary | ICD-10-CM | POA: Insufficient documentation

## 2018-09-28 DIAGNOSIS — M1288 Other specific arthropathies, not elsewhere classified, other specified site: Secondary | ICD-10-CM | POA: Diagnosis not present

## 2018-09-28 DIAGNOSIS — R531 Weakness: Secondary | ICD-10-CM | POA: Diagnosis not present

## 2018-09-28 DIAGNOSIS — R030 Elevated blood-pressure reading, without diagnosis of hypertension: Secondary | ICD-10-CM | POA: Diagnosis not present

## 2018-09-28 DIAGNOSIS — E1151 Type 2 diabetes mellitus with diabetic peripheral angiopathy without gangrene: Secondary | ICD-10-CM | POA: Diagnosis not present

## 2018-09-28 DIAGNOSIS — E785 Hyperlipidemia, unspecified: Secondary | ICD-10-CM | POA: Insufficient documentation

## 2018-09-28 DIAGNOSIS — Z823 Family history of stroke: Secondary | ICD-10-CM | POA: Insufficient documentation

## 2018-09-28 DIAGNOSIS — R200 Anesthesia of skin: Secondary | ICD-10-CM | POA: Diagnosis not present

## 2018-09-28 DIAGNOSIS — R079 Chest pain, unspecified: Secondary | ICD-10-CM | POA: Diagnosis not present

## 2018-09-28 DIAGNOSIS — K219 Gastro-esophageal reflux disease without esophagitis: Secondary | ICD-10-CM | POA: Insufficient documentation

## 2018-09-28 DIAGNOSIS — Z8673 Personal history of transient ischemic attack (TIA), and cerebral infarction without residual deficits: Secondary | ICD-10-CM | POA: Diagnosis not present

## 2018-09-28 DIAGNOSIS — Z794 Long term (current) use of insulin: Secondary | ICD-10-CM | POA: Insufficient documentation

## 2018-09-28 DIAGNOSIS — Z833 Family history of diabetes mellitus: Secondary | ICD-10-CM | POA: Diagnosis not present

## 2018-09-28 DIAGNOSIS — Z8349 Family history of other endocrine, nutritional and metabolic diseases: Secondary | ICD-10-CM | POA: Insufficient documentation

## 2018-09-28 DIAGNOSIS — G4733 Obstructive sleep apnea (adult) (pediatric): Secondary | ICD-10-CM | POA: Diagnosis not present

## 2018-09-28 DIAGNOSIS — M4802 Spinal stenosis, cervical region: Secondary | ICD-10-CM | POA: Diagnosis not present

## 2018-09-28 DIAGNOSIS — M5412 Radiculopathy, cervical region: Secondary | ICD-10-CM | POA: Insufficient documentation

## 2018-09-28 DIAGNOSIS — J45909 Unspecified asthma, uncomplicated: Secondary | ICD-10-CM | POA: Diagnosis not present

## 2018-09-28 DIAGNOSIS — E876 Hypokalemia: Secondary | ICD-10-CM | POA: Insufficient documentation

## 2018-09-28 DIAGNOSIS — Z79899 Other long term (current) drug therapy: Secondary | ICD-10-CM | POA: Diagnosis not present

## 2018-09-28 HISTORY — DX: Other symptoms and signs involving the musculoskeletal system: R29.898

## 2018-09-28 LAB — COMPREHENSIVE METABOLIC PANEL
ALT: 33 U/L (ref 0–44)
ANION GAP: 8 (ref 5–15)
AST: 43 U/L — ABNORMAL HIGH (ref 15–41)
Albumin: 4.2 g/dL (ref 3.5–5.0)
Alkaline Phosphatase: 100 U/L (ref 38–126)
BUN: 10 mg/dL (ref 8–23)
CO2: 20 mmol/L — ABNORMAL LOW (ref 22–32)
Calcium: 8.9 mg/dL (ref 8.9–10.3)
Chloride: 105 mmol/L (ref 98–111)
Creatinine, Ser: 0.68 mg/dL (ref 0.44–1.00)
GFR calc non Af Amer: >60 mL/min (ref >60)
Glucose, Bld: 137 mg/dL — ABNORMAL HIGH (ref 70–99)
Potassium: 3.3 mmol/L — ABNORMAL LOW (ref 3.5–5.1)
SODIUM: 133 mmol/L — AB (ref 135–145)
Total Bilirubin: 0.7 mg/dL (ref 0.3–1.2)
Total Protein: 7.9 g/dL (ref 6.5–8.1)

## 2018-09-28 LAB — DIFFERENTIAL
Abs Immature Granulocytes: 0.01 10*3/uL (ref 0.00–0.07)
BASOS ABS: 0 10*3/uL (ref 0.0–0.1)
Basophils Relative: 1 %
Eosinophils Absolute: 0.1 10*3/uL (ref 0.0–0.5)
Eosinophils Relative: 2 %
Immature Granulocytes: 0 %
Lymphocytes Relative: 38 %
Lymphs Abs: 2.3 10*3/uL (ref 0.7–4.0)
MONO ABS: 0.5 10*3/uL (ref 0.1–1.0)
Monocytes Relative: 8 %
Neutro Abs: 3.1 10*3/uL (ref 1.7–7.7)
Neutrophils Relative %: 51 %

## 2018-09-28 LAB — URINALYSIS, MICROSCOPIC (REFLEX): RBC / HPF: NONE SEEN RBC/hpf (ref 0–5)

## 2018-09-28 LAB — PROTIME-INR
INR: 1.1 (ref 0.8–1.2)
Prothrombin Time: 13.7 seconds (ref 11.4–15.2)

## 2018-09-28 LAB — APTT: aPTT: 29 seconds (ref 24–36)

## 2018-09-28 LAB — RAPID URINE DRUG SCREEN, HOSP PERFORMED
Amphetamines: NOT DETECTED
Barbiturates: NOT DETECTED
Benzodiazepines: NOT DETECTED
COCAINE: NOT DETECTED
Opiates: NOT DETECTED
Tetrahydrocannabinol: NOT DETECTED

## 2018-09-28 LAB — CBC
HEMATOCRIT: 41.5 % (ref 36.0–46.0)
Hemoglobin: 13.4 g/dL (ref 12.0–15.0)
MCH: 29.8 pg (ref 26.0–34.0)
MCHC: 32.3 g/dL (ref 30.0–36.0)
MCV: 92.4 fL (ref 80.0–100.0)
Platelets: 256 10*3/uL (ref 150–400)
RBC: 4.49 MIL/uL (ref 3.87–5.11)
RDW: 12.9 % (ref 11.5–15.5)
WBC: 6.1 10*3/uL (ref 4.0–10.5)
nRBC: 0 % (ref 0.0–0.2)

## 2018-09-28 LAB — ETHANOL: Alcohol, Ethyl (B): <10 mg/dL (ref <10)

## 2018-09-28 LAB — URINALYSIS, ROUTINE W REFLEX MICROSCOPIC
Bilirubin Urine: NEGATIVE
GLUCOSE, UA: NEGATIVE mg/dL
Hgb urine dipstick: NEGATIVE
Ketones, ur: NEGATIVE mg/dL
Nitrite: NEGATIVE
Protein, ur: NEGATIVE mg/dL
Specific Gravity, Urine: <1.005 — ABNORMAL LOW (ref 1.005–1.030)
pH: 6 (ref 5.0–8.0)

## 2018-09-28 NOTE — ED Notes (Signed)
Pt on monitor 

## 2018-09-28 NOTE — Telephone Encounter (Signed)
Patient calls because she is having numbness and tingling in her right arm.  Contacted patient back, she denies any other symptoms such as facial droop or slurred words.   We have no appts for tomorrow. Asked pt to go to the ED since she has a history of stroke and I dont want her to go all weekend without assessment.  Pt is agreeable to plan.   Advised to call and schedule an appt to further assess if cleared @ hospital. Aviraj Kentner, Salome Spotted, [place]

## 2018-09-28 NOTE — ED Notes (Signed)
ED Provider at bedside. 

## 2018-09-28 NOTE — ED Triage Notes (Signed)
Pt c/o numbness to right UE x 2 weeks-numbness to right side of face x today-NAD-steady gait

## 2018-09-28 NOTE — ED Notes (Signed)
Patient transported to CT 

## 2018-09-29 ENCOUNTER — Observation Stay (HOSPITAL_COMMUNITY): Payer: Medicare Other

## 2018-09-29 DIAGNOSIS — E785 Hyperlipidemia, unspecified: Secondary | ICD-10-CM | POA: Diagnosis not present

## 2018-09-29 DIAGNOSIS — G4733 Obstructive sleep apnea (adult) (pediatric): Secondary | ICD-10-CM | POA: Diagnosis not present

## 2018-09-29 DIAGNOSIS — M5412 Radiculopathy, cervical region: Secondary | ICD-10-CM | POA: Diagnosis not present

## 2018-09-29 DIAGNOSIS — R531 Weakness: Secondary | ICD-10-CM | POA: Diagnosis not present

## 2018-09-29 DIAGNOSIS — Z8673 Personal history of transient ischemic attack (TIA), and cerebral infarction without residual deficits: Secondary | ICD-10-CM | POA: Diagnosis not present

## 2018-09-29 DIAGNOSIS — Z7982 Long term (current) use of aspirin: Secondary | ICD-10-CM | POA: Diagnosis not present

## 2018-09-29 DIAGNOSIS — R202 Paresthesia of skin: Secondary | ICD-10-CM | POA: Diagnosis not present

## 2018-09-29 DIAGNOSIS — Z79899 Other long term (current) drug therapy: Secondary | ICD-10-CM | POA: Diagnosis not present

## 2018-09-29 DIAGNOSIS — M4802 Spinal stenosis, cervical region: Secondary | ICD-10-CM | POA: Diagnosis not present

## 2018-09-29 DIAGNOSIS — J45909 Unspecified asthma, uncomplicated: Secondary | ICD-10-CM | POA: Diagnosis not present

## 2018-09-29 DIAGNOSIS — Z888 Allergy status to other drugs, medicaments and biological substances status: Secondary | ICD-10-CM | POA: Diagnosis not present

## 2018-09-29 DIAGNOSIS — M50222 Other cervical disc displacement at C5-C6 level: Secondary | ICD-10-CM | POA: Diagnosis not present

## 2018-09-29 DIAGNOSIS — R29898 Other symptoms and signs involving the musculoskeletal system: Secondary | ICD-10-CM | POA: Diagnosis not present

## 2018-09-29 DIAGNOSIS — Z8349 Family history of other endocrine, nutritional and metabolic diseases: Secondary | ICD-10-CM | POA: Diagnosis not present

## 2018-09-29 DIAGNOSIS — E1151 Type 2 diabetes mellitus with diabetic peripheral angiopathy without gangrene: Secondary | ICD-10-CM | POA: Diagnosis not present

## 2018-09-29 DIAGNOSIS — R079 Chest pain, unspecified: Secondary | ICD-10-CM | POA: Diagnosis not present

## 2018-09-29 DIAGNOSIS — R200 Anesthesia of skin: Secondary | ICD-10-CM | POA: Diagnosis not present

## 2018-09-29 DIAGNOSIS — K219 Gastro-esophageal reflux disease without esophagitis: Secondary | ICD-10-CM | POA: Diagnosis not present

## 2018-09-29 DIAGNOSIS — M50223 Other cervical disc displacement at C6-C7 level: Secondary | ICD-10-CM | POA: Diagnosis not present

## 2018-09-29 DIAGNOSIS — Z823 Family history of stroke: Secondary | ICD-10-CM | POA: Diagnosis not present

## 2018-09-29 DIAGNOSIS — Z833 Family history of diabetes mellitus: Secondary | ICD-10-CM | POA: Diagnosis not present

## 2018-09-29 DIAGNOSIS — I639 Cerebral infarction, unspecified: Secondary | ICD-10-CM | POA: Diagnosis not present

## 2018-09-29 DIAGNOSIS — E876 Hypokalemia: Secondary | ICD-10-CM | POA: Diagnosis not present

## 2018-09-29 DIAGNOSIS — M1288 Other specific arthropathies, not elsewhere classified, other specified site: Secondary | ICD-10-CM | POA: Diagnosis not present

## 2018-09-29 DIAGNOSIS — R030 Elevated blood-pressure reading, without diagnosis of hypertension: Secondary | ICD-10-CM | POA: Diagnosis not present

## 2018-09-29 DIAGNOSIS — Z794 Long term (current) use of insulin: Secondary | ICD-10-CM | POA: Diagnosis not present

## 2018-09-29 HISTORY — DX: Other symptoms and signs involving the musculoskeletal system: R29.898

## 2018-09-29 LAB — BASIC METABOLIC PANEL
Anion gap: 8 (ref 5–15)
BUN: 8 mg/dL (ref 8–23)
CO2: 24 mmol/L (ref 22–32)
Calcium: 9.1 mg/dL (ref 8.9–10.3)
Chloride: 106 mmol/L (ref 98–111)
Creatinine, Ser: 0.83 mg/dL (ref 0.44–1.00)
GFR calc Af Amer: >60 mL/min (ref >60)
GFR calc non Af Amer: >60 mL/min (ref >60)
GLUCOSE: 100 mg/dL — AB (ref 70–99)
Potassium: 3.2 mmol/L — ABNORMAL LOW (ref 3.5–5.1)
Sodium: 138 mmol/L (ref 135–145)

## 2018-09-29 LAB — LIPID PANEL
CHOL/HDL RATIO: 2.3 ratio
Cholesterol: 137 mg/dL (ref 0–200)
HDL: 59 mg/dL (ref >40)
LDL Cholesterol: 66 mg/dL (ref 0–99)
Triglycerides: 59 mg/dL (ref <150)
VLDL: 12 mg/dL (ref 0–40)

## 2018-09-29 LAB — CBC
HCT: 41.1 % (ref 36.0–46.0)
Hemoglobin: 13.2 g/dL (ref 12.0–15.0)
MCH: 30.1 pg (ref 26.0–34.0)
MCHC: 32.1 g/dL (ref 30.0–36.0)
MCV: 93.6 fL (ref 80.0–100.0)
Platelets: 228 10*3/uL (ref 150–400)
RBC: 4.39 MIL/uL (ref 3.87–5.11)
RDW: 12.9 % (ref 11.5–15.5)
WBC: 6.1 10*3/uL (ref 4.0–10.5)
nRBC: 0 % (ref 0.0–0.2)

## 2018-09-29 LAB — TROPONIN I: Troponin I: <0.03 ng/mL (ref <0.03)

## 2018-09-29 LAB — TSH: TSH: 5.173 u[IU]/mL — ABNORMAL HIGH (ref 0.350–4.500)

## 2018-09-29 LAB — CBG MONITORING, ED: Glucose-Capillary: 99 mg/dL (ref 70–99)

## 2018-09-29 LAB — GLUCOSE, CAPILLARY: GLUCOSE-CAPILLARY: 197 mg/dL — AB (ref 70–99)

## 2018-09-29 LAB — HEMOGLOBIN A1C
Hgb A1c MFr Bld: 9.1 % — ABNORMAL HIGH (ref 4.8–5.6)
Mean Plasma Glucose: 214.47 mg/dL

## 2018-09-29 LAB — HIV ANTIBODY (ROUTINE TESTING W REFLEX): HIV Screen 4th Generation wRfx: NONREACTIVE

## 2018-09-29 MED ORDER — ACETAMINOPHEN 650 MG RE SUPP: 650.0000 mg | RECTAL | Status: DC | PRN

## 2018-09-29 MED ORDER — SENNOSIDES-DOCUSATE SODIUM 8.6-50 MG PO TABS: 1.0000 | ORAL_TABLET | Freq: Every evening | ORAL | Status: DC | PRN

## 2018-09-29 MED ORDER — ACETAMINOPHEN 325 MG PO TABS: 650.0000 mg | ORAL_TABLET | ORAL | Status: DC | PRN

## 2018-09-29 MED ORDER — ASPIRIN EC 325 MG PO TBEC
325.0000 mg | DELAYED_RELEASE_TABLET | Freq: Every day | ORAL | Status: DC
  Administered 2018-09-29: 325 mg via ORAL
  Filled 2018-09-29: qty 1

## 2018-09-29 MED ORDER — PANTOPRAZOLE SODIUM 40 MG PO TBEC
40.0000 mg | DELAYED_RELEASE_TABLET | Freq: Every day | ORAL | Status: DC
  Filled 2018-09-29: qty 1

## 2018-09-29 MED ORDER — STROKE: EARLY STAGES OF RECOVERY BOOK
Freq: Once | Status: AC
  Administered 2018-09-29: 11:00:00
  Filled 2018-09-29: qty 1

## 2018-09-29 MED ORDER — INSULIN ASPART 100 UNIT/ML ~~LOC~~ SOLN
0.0000 [IU] | Freq: Three times a day (TID) | SUBCUTANEOUS | Status: DC
  Administered 2018-09-29: 2 [IU] via SUBCUTANEOUS

## 2018-09-29 MED ORDER — INSULIN GLARGINE 100 UNIT/ML ~~LOC~~ SOLN
12.0000 [IU] | Freq: Every day | SUBCUTANEOUS | Status: DC
  Filled 2018-09-29: qty 0.12

## 2018-09-29 MED ORDER — ATORVASTATIN CALCIUM 40 MG PO TABS: 40.0000 mg | ORAL_TABLET | Freq: Every day | ORAL | Status: DC

## 2018-09-29 MED ORDER — POTASSIUM CHLORIDE CRYS ER 20 MEQ PO TBCR
40.0000 meq | EXTENDED_RELEASE_TABLET | Freq: Once | ORAL | Status: AC
  Administered 2018-09-29: 40 meq via ORAL
  Filled 2018-09-29: qty 2

## 2018-09-29 MED ORDER — INSULIN GLARGINE 100 UNIT/ML ~~LOC~~ SOLN: 8.0000 [IU] | Freq: Every day | SUBCUTANEOUS | Status: DC

## 2018-09-29 MED ORDER — ENOXAPARIN SODIUM 40 MG/0.4ML ~~LOC~~ SOLN
40.0000 mg | Freq: Every day | SUBCUTANEOUS | Status: DC
  Filled 2018-09-29: qty 0.4

## 2018-09-29 MED ORDER — ACETAMINOPHEN 160 MG/5ML PO SOLN: 650.0000 mg | ORAL | Status: DC | PRN

## 2018-09-29 NOTE — ED Notes (Signed)
Attempted report 

## 2018-09-29 NOTE — H&P (Addendum)
[facility] Admission History and Physical Service Pager: [phone]  Patient name: Wendy Mcclain Medical record number: [id number] Date of birth: [birth year] Age: 68 y.o. Gender: female  Primary Care Provider: Kathrene Alu, MD Consultants: Neuro Code Status: Full  Chief Complaint: R-sided arm weakness  Assessment and Plan: Wendy Mcclain is a 68 y.o. female presenting with R-sided arm weakness concerning for TIA vs Stroke. PMH is significant for TIA, CVA (2018), elevated BP, T2DM, Asthma, and GERD.  R arm numbness/tingling concerning for TIA vs Stroke, new  Patient reporting 2 weeks of numbness and tingling with shooting pain in her right arm from shoulder to fingers.  Also reporting right-sided cheek numbness/tingling since 1 day.  Patient has a history of CVA in March 2018 producing left-sided weakness and inability to move her Left leg, but states this does not feel like her CVA. CT head this admission showing no intracranial abnormality. Risk Factors for stroke include uncontrolled diabetes with A1c 9.1%. Risk stratification labs showing TSH elevated at 5.173, could be falsely elevated in setting of acute illness. Lipid panel within normal limits. BP 129/73 - 169/83 upon admission, but blood pressures from clinic visits have been within normal limits. UA showing trace LE and low specific gravity, otherwise WNL. UDS negative.  The patient's Right arm weakness could be due to a TIA as the sensation are waxing and waning and head CT is negative. Stroke is less likely at this point. Also to keep on the differential diagnosis includes thoracic outlet syndrome causing blood vessel and/or nerve impingement to the R arm (sensation improves with arm repositioning, strength is 5/5 in upper extremities bilaterally). Also consider cervical radiculopathy (Spurling Test in the ED was equivocal but patient did have increased tenderness/pain in R arm after exam.) Unilateral,  waxing and waning numbness and tingling are less likely due to neuropathy related to type-2 diabetes. Patient recommended for transfer from [facility] for admission to [facility] for stroke/TIA r/o given concerning findings of numbness/weakness. Neurology consulted in ED who recommended further imaging and stated they would consult when arrived to [facility]. Unable to obtain MRI at [facility]. MRI brain on presentation to [facility] showing normal MRI head. MRI cervical sine showing severe R>L C6-7 neural foraminal narrowing, giving likely etiology of radiculopathy. Per phone conversation with Dr. Cheral Marker, will give further recommendations after he has reviewed MRI results.   -Admit as tele - obs under attending Dr. Erin Hearing -Neuro following; appreciate recs -Continuous cardiac monitoring; Pulse Ox q2 hrs -Vitals per floor protocol -Permissive hypertension x24 hours -Carb modified diet - patient passed swallow eval in the ED -PT/OT/SLP eval and treat -AM CBC, BMP -Fall precautions - ambulate with assistance -Continue Lipitor 40 (patient did not tolerate Lipitor 80) -ASA 325mg  daily -Tylenol PRN pain  Chest pain: the patient also reported some non-radiating chest pain this admission that is reproducible on physical exam. Troponin negative x 1. EKG this admission showing sinus rhythm at HR 71 bpm w/o signs of ischemia or ST-segment changes. -Continuous cardiac monitoring -KPad as needed for MSK chest pain -Low threshold to repeat EKG, Troponins  Elevated BP B/P on admission 129/73 - 169/83. Patient does not have history of HTN and does not take any meds for such. Possible 2/2 stress from ED transfer and news of admission.  -Permissive HTN x 24 hours -Vitals per floor protocol  Hypokalemia Potassium low at 3.3 this admission. Had one low reading at 3.4 one year prior, otherwise levels wnl. -Replete with KDur  39mEq x1 -monitor on daily BMP  Diabetes  A1c this admission 9.1%, blood sugar 137. Followed by Dr. Delrae Rend  (Endocrinology) with [facility] at [place]. Regimen includes Tresiba 24u qHS and Humalog 9u BID qAC. -sSSI -Lantus 12U daily -CBGs 4x daily qACHS  HLD Lipid panel this admission wnl. Takes Lipitor 40mg  daily. -Continue Lipitor 32  H/o CVA (March 2018) H/o caudate lacunar infarct 2/2 small vessel disease. Followed by [facility]. Last seen on 06/2017 at which time she was discharged from stroke clinic. Patient was instructed to continue ASA 325mg  and lipitor. Does not appear to have residual deficits.  -Continue ASA and Lipitor  FEN/GI: Carb-modified Deit; Senokot PRN Prophylaxis: Lovenox  Disposition: Admit to Medical telemetry for observation, attending Dr. Erin Hearing  History of Present Illness:  Wendy Mcclain is a 68 y.o. female presenting with 2 weeks of waxing and waning R arm numbness and tingling that radiates pain from her R shoulder to her R hand. She says the episodes can start as numbness or tingling in her R hand and then spread up her R arm, shoulder and into her neck, then the sensation turns painful and mimics the feeling of a limb falling asleep. Patient reports she cannot recall an inciting event or injury, except that she maybe pulled her shoulder the wrong way when lugging a suitcase around 2 weeks ago on a beach/boating trip. Repositioning makes the pain slowly go away but reports that it eventually returns. She cannot think of any particular motions or positions that make the pain worse. She denies any issues with her L arm or with either leg.  Patient called PCP office today who recommended patient come to ED to be evaluated for possible stroke. In ED CT scan was negative. [facility] ED without further imaging modalities so unable to obtain MRI. Neurology was consulted who recommended neuro follow up in hospital and MRI, given these recommendations patient was transferred to [facility] for admission.     Review Of Systems: Per HPI with the following additions:    Review of Systems  Respiratory: Negative for sputum production and shortness of breath.   Cardiovascular: Positive for chest pain (Left sided, nonradiating, reproducible w/ palpation). Negative for leg swelling.  Gastrointestinal: Negative for nausea and vomiting.  Neurological: Positive for tingling (R arm, intermittent), sensory change (R arm, intermittent) and headaches. Negative for speech change and weakness.   Patient Active Problem List   Diagnosis Date Noted  . Arm weakness 09/28/2018  . Anxiety state 06/19/2018  . Tick bite of abdomen 12/13/2017  . Tension headache 04/20/2017  . Baker's cyst of knee, right 04/20/2017  . Grief reaction 04/20/2017  . Viral URI with cough 01/12/2017  . Left leg weakness   . Cerebrovascular accident (CVA) ([place])   . Hyperlipidemia   . OSA (obstructive sleep apnea)   . Left-sided weakness 10/11/2016  . Orthostatic hypotension 09/18/2015  . Chest pain 08/15/2015  . Acute sinusitis 08/19/2014  . Yeast infection 08/19/2014  . Vitamin D deficiency 05/20/2014  . Gastro-esophageal reflux 03/20/2014  . Allergic rhinitis 07/09/2013  . Sleep disorder 07/09/2013  . Elevated blood pressure (not hypertension) 07/09/2013  . Mood disorder ([place]) 11/23/2011  . Diabetes mellitus ([place]) 11/23/2011  . Health care maintenance 11/23/2011   Past Medical History: Past Medical History:  Diagnosis Date  . Arrhythmia   . Asthma   . Diabetes mellitus   . GERD (gastroesophageal reflux disease)   . Stroke [facility])    Past Surgical  History: Past Surgical History:  Procedure Laterality Date  . ABDOMINAL HYSTERECTOMY  1993  . APPENDECTOMY  1963  . BREAST ENHANCEMENT SURGERY  1986   Social History: Social History   Tobacco Use  . Smoking status: Never Smoker  . Smokeless tobacco: Never Used  Substance Use Topics  . Alcohol use: No  . Drug use: No   Additional social history: Lives alone, sometimes daughter comes and stays. Mostly independent but daughter  helps with some tasks such as grocery shopping.  Please also refer to relevant sections of EMR.  Family History: Family History  Problem Relation Age of Onset  . Alzheimer's disease Mother   . Cancer Father        esophageal  . Aortic aneurysm Paternal Aunt        2 aunts died at age 65  . Cancer Sister        stomach  . Cancer Paternal Grandfather        mouth & larynx  . Cancer Paternal Aunt        colon  . Heart disease Other   . Diabetes Other   . Hyperlipidemia Other   . Hypertension Other   . Stroke Other   . Thyroid disease Sister   . Hyperlipidemia Daughter   . Thyroid disease Daughter   . Colon cancer Neg Hx     Allergies and Medications: Allergies  Allergen Reactions  . Metformin And Related Diarrhea   No current facility-administered medications on file prior to encounter.    Current Outpatient Medications on File Prior to Encounter  Medication Sig Dispense Refill  . acetaminophen (TYLENOL) 500 MG tablet Take 500-1,000 mg by mouth every 6 (six) hours as needed (for headaches).     Marland Kitchen albuterol (PROVENTIL HFA;VENTOLIN HFA) 108 (90 Base) MCG/ACT inhaler Inhale 2 puffs into the lungs every 6 (six) hours as needed for wheezing or shortness of breath. 3 Inhaler 1  . aspirin EC 325 MG tablet TAKE 1 TABLET(325 MG) BY MOUTH DAILY (Patient taking differently: Take 325 mg by mouth daily. ) 30 tablet 0  . atorvastatin (LIPITOR) 40 MG tablet TAKE 1 TABLET BY MOUTH DAILY AT 6 PM (Patient taking differently: Take 40 mg by mouth daily at 6 PM. ) 90 tablet 4  . cetirizine (ZYRTEC) 10 MG tablet TAKE 1 TABLET BY MOUTH DAILY. (Patient taking differently: Take 10 mg by mouth daily as needed for allergies or rhinitis. ) 30 tablet 0  . Cholecalciferol (VITAMIN D-3) 25 MCG (1000 UT) CAPS Take 1,000 Units by mouth daily.     Marland Kitchen HUMALOG KWIKPEN 100 UNIT/ML KiwkPen Inject 9 Units into the skin 2 (two) times daily before a meal.   0  . Insulin Degludec (TRESIBA FLEXTOUCH) 100 UNIT/ML SOPN  Inject 24 Units into the skin at bedtime.     . Multiple Vitamin (MULTIVITAMIN) tablet Take 1 tablet by mouth 2 (two) times a week.     Marland Kitchen omeprazole (PRILOSEC) 20 MG capsule TAKE 1 CAPSULE(20 MG) BY MOUTH DAILY (Patient taking differently: Take 20 mg by mouth 3 (three) times a week. ) 90 capsule 0  . scopolamine (TRANSDERM-SCOP) 1 MG/3DAYS Place 1 patch (1.5 mg total) onto the skin every 3 (three) days. (Patient taking differently: Place 1 patch onto the skin every 3 (three) days as needed (for sea sickness). ) 10 patch 1  . cetirizine (ZYRTEC) 10 MG tablet Take 1 tablet (10 mg total) by mouth daily. (Patient not taking: Reported on 09/29/2018) 90  tablet 4  . omeprazole (PRILOSEC) 20 MG capsule TAKE 1 CAPSULE(20 MG) BY MOUTH DAILY (Patient not taking: Reported on 09/29/2018) 90 capsule 0  . omeprazole (PRILOSEC) 20 MG capsule TAKE 1 CAPSULE(20 MG) BY MOUTH DAILY (Patient not taking: Reported on 09/29/2018) 90 capsule 0   Objective: BP (!) 144/73   Pulse 75   Temp (!) 97.4 F (36.3 C) (Oral)   Resp (!) 22   Ht 5\' 2"  (1.575 m)   Wt 64.4 kg   SpO2 97%   BMI 25.97 kg/m  Physical Exam Constitutional:      General: She is not in acute distress.    Appearance: She is not ill-appearing.  Eyes:     Extraocular Movements: Extraocular movements intact.     Pupils: Pupils are equal, round, and reactive to light.  Neck:     Comments: Limited ROM in bilateral sidebending (ear to shoulder); equivocal R Spurling  Cardiovascular:     Rate and Rhythm: Normal rate and regular rhythm.     Pulses: Normal pulses.     Heart sounds: Normal heart sounds. No murmur. No friction rub. No gallop.   Pulmonary:     Effort: Pulmonary effort is normal. No respiratory distress.     Breath sounds: Normal breath sounds.  Abdominal:     General: Bowel sounds are normal.     Palpations: Abdomen is soft.  Musculoskeletal:     Right lower leg: No edema.     Left lower leg: No edema.  Neurological:     General: No  focal deficit present.     Mental Status: She is oriented to person, place, and time.     Cranial Nerves: No cranial nerve deficit.     Motor: No weakness.     Coordination: Coordination normal.   Labs and Imaging: CBC BMET  Recent Labs  Lab 09/28/18 2209  WBC 6.1  HGB 13.4  HCT 41.5  PLT 256   Recent Labs  Lab 09/28/18 2209  NA 133*  K 3.3*  CL 105  CO2 20*  BUN 10  CREATININE 0.68  GLUCOSE 137*  CALCIUM 8.9    Lipid Panel     Component Value Date/Time   CHOL 137 09/29/2018 0229   TRIG 59 09/29/2018 0229   HDL 59 09/29/2018 0229   CHOLHDL 2.3 09/29/2018 0229   VLDL 12 09/29/2018 0229   LDLCALC 66 09/29/2018 0229   Urinalysis    Component Value Date/Time   COLORURINE YELLOW 09/28/2018 2220   APPEARANCEUR CLEAR 09/28/2018 2220   LABSPEC <1.005 (L) 09/28/2018 2220   PHURINE 6.0 09/28/2018 2220   GLUCOSEU NEGATIVE 09/28/2018 2220   HGBUR NEGATIVE 09/28/2018 2220   BILIRUBINUR NEGATIVE 09/28/2018 2220   KETONESUR NEGATIVE 09/28/2018 2220   PROTEINUR NEGATIVE 09/28/2018 2220   NITRITE NEGATIVE 09/28/2018 2220   LEUKOCYTESUR TRACE (A) 09/28/2018 2220   Drugs of Abuse     Component Value Date/Time   LABOPIA NONE DETECTED 09/28/2018 2220   COCAINSCRNUR NONE DETECTED 09/28/2018 2220   LABBENZ NONE DETECTED 09/28/2018 2220   AMPHETMU NONE DETECTED 09/28/2018 2220   THCU NONE DETECTED 09/28/2018 2220   LABBARB NONE DETECTED 09/28/2018 2220    Ct Head Wo Contrast  Result Date: 09/28/2018 CLINICAL DATA:  Right upper extremity numbness. EXAM: CT HEAD WITHOUT CONTRAST TECHNIQUE: Contiguous axial images were obtained from the base of the skull through the vertex without intravenous contrast. COMPARISON:  10/11/2016 FINDINGS: Brain: No acute intracranial abnormality. Specifically, no hemorrhage, hydrocephalus,  mass lesion, acute infarction, or significant intracranial injury. Vascular: No hyperdense vessel or unexpected calcification. Skull: No acute calvarial  abnormality. Sinuses/Orbits: Visualized paranasal sinuses and mastoids clear. Orbital soft tissues unremarkable. Other: None IMPRESSION: No intracranial abnormality. Electronically Signed   By: Rolm Baptise M.D.   On: 09/28/2018 22:22   Mr Jodene Nam Head Wo Contrast  Result Date: 09/29/2018 CLINICAL DATA:  RIGHT arm weakness, assess for stroke. History of stroke and diabetes. EXAM: MRI HEAD WITHOUT CONTRAST MRA HEAD WITHOUT CONTRAST MRI CERVICAL SPINE WITHOUT CONTRAST TECHNIQUE: Multiplanar, multiecho pulse sequences of the brain and surrounding structures were obtained without intravenous contrast. Angiographic images of the head were obtained using MRA technique without contrast. Multiplanar, multiecho pulse sequences of the brain and surrounding structures, and cervical spine, to include the craniocervical junction and cervicothoracic junction, were obtained without intravenous contrast. COMPARISON:  CT HEAD September 28, 2018 and MRI/MRA head October 11, 2016 FINDINGS: MRI HEAD FINDINGS INTRACRANIAL CONTENTS: No reduced diffusion to suggest acute ischemia. No susceptibility artifact to suggest hemorrhage. No parenchymal brain volume loss for age. No hydrocephalus. Prior RIGHT caudate head lacunar infarcts not apparent today. No suspicious parenchymal signal, masses, mass effect. No abnormal extra-axial fluid collections. No extra-axial masses. VASCULAR: Normal major intracranial vascular flow voids present at skull base. SKULL AND UPPER CERVICAL SPINE: No abnormal sellar expansion. No suspicious calvarial bone marrow signal. Craniocervical junction maintained. SINUSES/ORBITS: Mild RIGHT maxillary sinus mucosal thickening without air-fluid levels.The included ocular globes and orbital contents are non-suspicious. Status post bilateral ocular lens implants. OTHER: None. MRA HEAD FINDINGS ANTERIOR CIRCULATION: Normal flow related enhancement of the included cervical, petrous, cavernous and supraclinoid internal carotid  arteries. Patent anterior communicating artery. Patent anterior and middle cerebral arteries. No large vessel occlusion, flow limiting stenosis, or aneurysm. POSTERIOR CIRCULATION: LEFT vertebral artery is dominant. Vertebrobasilar arteries are patent, with normal flow related enhancement of the main branch vessels. Patent posterior cerebral arteries. No large vessel occlusion, flow limiting stenosis, or aneurysm. ANATOMIC VARIANTS: None. Source data and MIP images were reviewed. MRI CERVICAL SPINE FINDINGS ALIGNMENT: Maintained cervical lordosis.  No malalignment. VERTEBRAE/DISCS: Vertebral bodies are intact. Moderate C6-7 disc height loss with proportional acute on chronic discogenic endplate changes. Remaining disc morphology is maintained with mild desiccation. Mild chronic discogenic endplate changes C4-8. Low T1 and bright STIR signal RIGHT C5-6 facet associated with arthropathy. CORD:Cervical spinal cord is normal morphology and signal characteristics from the cervicomedullary junction to level of T3-4, the most caudal well visualized level. POSTERIOR FOSSA, VERTEBRAL ARTERIES, PARASPINAL TISSUES: No MR findings of ligamentous injury. Vertebral artery flow voids present. Included posterior fossa and paraspinal soft tissues are normal. DISC LEVELS: C2-3, C3-4: No disc bulge, canal stenosis nor neural foraminal narrowing. Mild facet arthropathy. C4-5: Small broad-based disc bulge, uncovertebral hypertrophy and moderate to severe facet arthropathy. No canal stenosis or neural foraminal narrowing. C5-6: Small broad-based disc bulge. Severe RIGHT and moderate LEFT facet arthropathy. No canal stenosis or neural foraminal narrowing. C6-7: Small broad-based disc bulge, moderate RIGHT subarticular disc protrusion. Uncovertebral hypertrophy and mild facet arthropathy. No canal stenosis. Severe RIGHT > LEFT neural foraminal narrowing. C7-T1: No disc bulge, canal stenosis nor neural foraminal narrowing. IMPRESSION: MRI  HEAD: 1. Normal noncontrast MRI head. MRA HEAD: 1. Normal noncontrast MRA head. MRI CERVICAL SPINE: 1. No fracture or malalignment. 2. Severe RIGHT C5-6 facet arthropathy no acute inflammatory changes. 3. No canal stenosis. Severe RIGHT > LEFT C6-7 neural foraminal narrowing. Electronically Signed   By: Thana Farr.D.  On: 09/29/2018 05:09   Mr Brain Wo Contrast  Result Date: 09/29/2018 CLINICAL DATA:  RIGHT arm weakness, assess for stroke. History of stroke and diabetes. EXAM: MRI HEAD WITHOUT CONTRAST MRA HEAD WITHOUT CONTRAST MRI CERVICAL SPINE WITHOUT CONTRAST TECHNIQUE: Multiplanar, multiecho pulse sequences of the brain and surrounding structures were obtained without intravenous contrast. Angiographic images of the head were obtained using MRA technique without contrast. Multiplanar, multiecho pulse sequences of the brain and surrounding structures, and cervical spine, to include the craniocervical junction and cervicothoracic junction, were obtained without intravenous contrast. COMPARISON:  CT HEAD September 28, 2018 and MRI/MRA head October 11, 2016 FINDINGS: MRI HEAD FINDINGS INTRACRANIAL CONTENTS: No reduced diffusion to suggest acute ischemia. No susceptibility artifact to suggest hemorrhage. No parenchymal brain volume loss for age. No hydrocephalus. Prior RIGHT caudate head lacunar infarcts not apparent today. No suspicious parenchymal signal, masses, mass effect. No abnormal extra-axial fluid collections. No extra-axial masses. VASCULAR: Normal major intracranial vascular flow voids present at skull base. SKULL AND UPPER CERVICAL SPINE: No abnormal sellar expansion. No suspicious calvarial bone marrow signal. Craniocervical junction maintained. SINUSES/ORBITS: Mild RIGHT maxillary sinus mucosal thickening without air-fluid levels.The included ocular globes and orbital contents are non-suspicious. Status post bilateral ocular lens implants. OTHER: None. MRA HEAD FINDINGS ANTERIOR  CIRCULATION: Normal flow related enhancement of the included cervical, petrous, cavernous and supraclinoid internal carotid arteries. Patent anterior communicating artery. Patent anterior and middle cerebral arteries. No large vessel occlusion, flow limiting stenosis, or aneurysm. POSTERIOR CIRCULATION: LEFT vertebral artery is dominant. Vertebrobasilar arteries are patent, with normal flow related enhancement of the main branch vessels. Patent posterior cerebral arteries. No large vessel occlusion, flow limiting stenosis, or aneurysm. ANATOMIC VARIANTS: None. Source data and MIP images were reviewed. MRI CERVICAL SPINE FINDINGS ALIGNMENT: Maintained cervical lordosis.  No malalignment. VERTEBRAE/DISCS: Vertebral bodies are intact. Moderate C6-7 disc height loss with proportional acute on chronic discogenic endplate changes. Remaining disc morphology is maintained with mild desiccation. Mild chronic discogenic endplate changes C7-8. Low T1 and bright STIR signal RIGHT C5-6 facet associated with arthropathy. CORD:Cervical spinal cord is normal morphology and signal characteristics from the cervicomedullary junction to level of T3-4, the most caudal well visualized level. POSTERIOR FOSSA, VERTEBRAL ARTERIES, PARASPINAL TISSUES: No MR findings of ligamentous injury. Vertebral artery flow voids present. Included posterior fossa and paraspinal soft tissues are normal. DISC LEVELS: C2-3, C3-4: No disc bulge, canal stenosis nor neural foraminal narrowing. Mild facet arthropathy. C4-5: Small broad-based disc bulge, uncovertebral hypertrophy and moderate to severe facet arthropathy. No canal stenosis or neural foraminal narrowing. C5-6: Small broad-based disc bulge. Severe RIGHT and moderate LEFT facet arthropathy. No canal stenosis or neural foraminal narrowing. C6-7: Small broad-based disc bulge, moderate RIGHT subarticular disc protrusion. Uncovertebral hypertrophy and mild facet arthropathy. No canal stenosis. Severe  RIGHT > LEFT neural foraminal narrowing. C7-T1: No disc bulge, canal stenosis nor neural foraminal narrowing. IMPRESSION: MRI HEAD: 1. Normal noncontrast MRI head. MRA HEAD: 1. Normal noncontrast MRA head. MRI CERVICAL SPINE: 1. No fracture or malalignment. 2. Severe RIGHT C5-6 facet arthropathy no acute inflammatory changes. 3. No canal stenosis. Severe RIGHT > LEFT C6-7 neural foraminal narrowing. Electronically Signed   By: Elon Alas M.D.   On: 09/29/2018 05:09   Mr Cervical Spine Wo Contrast  Result Date: 09/29/2018 CLINICAL DATA:  RIGHT arm weakness, assess for stroke. History of stroke and diabetes. EXAM: MRI HEAD WITHOUT CONTRAST MRA HEAD WITHOUT CONTRAST MRI CERVICAL SPINE WITHOUT CONTRAST TECHNIQUE: Multiplanar, multiecho pulse sequences of the  brain and surrounding structures were obtained without intravenous contrast. Angiographic images of the head were obtained using MRA technique without contrast. Multiplanar, multiecho pulse sequences of the brain and surrounding structures, and cervical spine, to include the craniocervical junction and cervicothoracic junction, were obtained without intravenous contrast. COMPARISON:  CT HEAD September 28, 2018 and MRI/MRA head October 11, 2016 FINDINGS: MRI HEAD FINDINGS INTRACRANIAL CONTENTS: No reduced diffusion to suggest acute ischemia. No susceptibility artifact to suggest hemorrhage. No parenchymal brain volume loss for age. No hydrocephalus. Prior RIGHT caudate head lacunar infarcts not apparent today. No suspicious parenchymal signal, masses, mass effect. No abnormal extra-axial fluid collections. No extra-axial masses. VASCULAR: Normal major intracranial vascular flow voids present at skull base. SKULL AND UPPER CERVICAL SPINE: No abnormal sellar expansion. No suspicious calvarial bone marrow signal. Craniocervical junction maintained. SINUSES/ORBITS: Mild RIGHT maxillary sinus mucosal thickening without air-fluid levels.The included ocular globes  and orbital contents are non-suspicious. Status post bilateral ocular lens implants. OTHER: None. MRA HEAD FINDINGS ANTERIOR CIRCULATION: Normal flow related enhancement of the included cervical, petrous, cavernous and supraclinoid internal carotid arteries. Patent anterior communicating artery. Patent anterior and middle cerebral arteries. No large vessel occlusion, flow limiting stenosis, or aneurysm. POSTERIOR CIRCULATION: LEFT vertebral artery is dominant. Vertebrobasilar arteries are patent, with normal flow related enhancement of the main branch vessels. Patent posterior cerebral arteries. No large vessel occlusion, flow limiting stenosis, or aneurysm. ANATOMIC VARIANTS: None. Source data and MIP images were reviewed. MRI CERVICAL SPINE FINDINGS ALIGNMENT: Maintained cervical lordosis.  No malalignment. VERTEBRAE/DISCS: Vertebral bodies are intact. Moderate C6-7 disc height loss with proportional acute on chronic discogenic endplate changes. Remaining disc morphology is maintained with mild desiccation. Mild chronic discogenic endplate changes E9-3. Low T1 and bright STIR signal RIGHT C5-6 facet associated with arthropathy. CORD:Cervical spinal cord is normal morphology and signal characteristics from the cervicomedullary junction to level of T3-4, the most caudal well visualized level. POSTERIOR FOSSA, VERTEBRAL ARTERIES, PARASPINAL TISSUES: No MR findings of ligamentous injury. Vertebral artery flow voids present. Included posterior fossa and paraspinal soft tissues are normal. DISC LEVELS: C2-3, C3-4: No disc bulge, canal stenosis nor neural foraminal narrowing. Mild facet arthropathy. C4-5: Small broad-based disc bulge, uncovertebral hypertrophy and moderate to severe facet arthropathy. No canal stenosis or neural foraminal narrowing. C5-6: Small broad-based disc bulge. Severe RIGHT and moderate LEFT facet arthropathy. No canal stenosis or neural foraminal narrowing. C6-7: Small broad-based disc bulge,  moderate RIGHT subarticular disc protrusion. Uncovertebral hypertrophy and mild facet arthropathy. No canal stenosis. Severe RIGHT > LEFT neural foraminal narrowing. C7-T1: No disc bulge, canal stenosis nor neural foraminal narrowing. IMPRESSION: MRI HEAD: 1. Normal noncontrast MRI head. MRA HEAD: 1. Normal noncontrast MRA head. MRI CERVICAL SPINE: 1. No fracture or malalignment. 2. Severe RIGHT C5-6 facet arthropathy no acute inflammatory changes. 3. No canal stenosis. Severe RIGHT > LEFT C6-7 neural foraminal narrowing. Electronically Signed   By: Elon Alas M.D.   On: 09/29/2018 05:09    Daisy Floro, DO 09/29/2018, 2:15 AM PGY-1, [facility] Intern pager: [phone], text pages welcome  FPTS Upper-Level Resident Addendum   I have independently interviewed and examined the patient. I have discussed the above with the original author and agree with their documentation. My edits for correction/addition/clarification are in blue. Please see also any attending notes.   Caroline More, DO PGY-2, [facility] 09/29/2018 5:33 AM  FPTS Service pager: [phone] (text pages welcome through [facility])

## 2018-09-29 NOTE — Discharge Instructions (Signed)
Please follow up at [facility] clinic at the scheduled time below for hospital follow up.   Future Appointments  Date Time Provider [place]  10/04/2018  2:25 PM Wilber Oliphant, MD [facility]  10/25/2018  9:20 AM Nahser, Wonda Cheng, MD [facility]

## 2018-09-29 NOTE — ED Provider Notes (Signed)
[place] EMERGENCY DEPARTMENT Provider Note   CSN: [id number] Arrival date & time: 09/28/18  2100    History   Chief Complaint Chief Complaint  Patient presents with  . Numbness    HPI Wendy Mcclain is a 68 y.o. female.      Neurologic Problem  This is a new problem. The current episode started more than 1 week ago. The problem occurs constantly. The problem has been gradually worsening. Pertinent negatives include no chest pain, no headaches and no shortness of breath. Nothing aggravates the symptoms. She has tried nothing for the symptoms.    Past Medical History:  Diagnosis Date  . Arrhythmia   . Asthma   . Diabetes mellitus   . GERD (gastroesophageal reflux disease)   . Stroke [facility])     Patient Active Problem List   Diagnosis Date Noted  . Arm weakness 09/28/2018  . Anxiety state 06/19/2018  . Tick bite of abdomen 12/13/2017  . Tension headache 04/20/2017  . Baker's cyst of knee, right 04/20/2017  . Grief reaction 04/20/2017  . Viral URI with cough 01/12/2017  . Left leg weakness   . Cerebrovascular accident (CVA) ([place])   . Hyperlipidemia   . OSA (obstructive sleep apnea)   . Left-sided weakness 10/11/2016  . Orthostatic hypotension 09/18/2015  . Chest pain 08/15/2015  . Acute sinusitis 08/19/2014  . Yeast infection 08/19/2014  . Vitamin D deficiency 05/20/2014  . Gastro-esophageal reflux 03/20/2014  . Allergic rhinitis 07/09/2013  . Sleep disorder 07/09/2013  . Elevated blood pressure (not hypertension) 07/09/2013  . Mood disorder ([place]) 11/23/2011  . Diabetes mellitus ([place]) 11/23/2011  . Health care maintenance 11/23/2011    Past Surgical History:  Procedure Laterality Date  . ABDOMINAL HYSTERECTOMY  1993  . APPENDECTOMY  1963  . BREAST ENHANCEMENT SURGERY  1986     OB History   No obstetric history on file.      Home Medications    Prior to Admission medications   Medication Sig Start Date End Date Taking? Authorizing  Provider  albuterol (PROVENTIL HFA;VENTOLIN HFA) 108 (90 Base) MCG/ACT inhaler Inhale 2 puffs into the lungs every 6 (six) hours as needed for wheezing or shortness of breath. 09/13/17   Nicolette Bang, DO  aspirin EC 325 MG tablet TAKE 1 TABLET(325 MG) BY MOUTH DAILY 01/13/18   Kathrene Alu, MD  atorvastatin (LIPITOR) 40 MG tablet TAKE 1 TABLET BY MOUTH DAILY AT 6 PM 10/19/17   Winfrey, Alcario Drought, MD  cetirizine (ZYRTEC) 10 MG tablet Take 1 tablet (10 mg total) by mouth daily. 10/15/16   Janora Norlander, DO  cetirizine (ZYRTEC) 10 MG tablet TAKE 1 TABLET BY MOUTH DAILY. 05/15/18   Kathrene Alu, MD  HUMALOG KWIKPEN 100 UNIT/ML KiwkPen Inject 4 Units into the skin 4 (four) times daily -  before meals and at bedtime. 10/19/16   [provider]  Insulin Degludec (TRESIBA FLEXTOUCH) 100 UNIT/ML SOPN Inject 32 Units into the skin daily.    [provider]  Multiple Vitamin (MULTIVITAMIN) tablet Take 1 tablet by mouth daily.    [provider]  omeprazole (PRILOSEC) 20 MG capsule TAKE 1 CAPSULE(20 MG) BY MOUTH DAILY 06/16/17   Kathrene Alu, MD  omeprazole (PRILOSEC) 20 MG capsule TAKE 1 CAPSULE(20 MG) BY MOUTH DAILY 03/27/18   Kathrene Alu, MD  omeprazole (PRILOSEC) 20 MG capsule TAKE 1 CAPSULE(20 MG) BY MOUTH DAILY 04/21/18   Winfrey, Alcario Drought, MD  scopolamine (TRANSDERM-SCOP) 1 MG/3DAYS Place 1 patch (1.5 mg total) onto the skin every 3 (three) days. 02/08/18   Kathrene Alu, MD    Family History Family History  Problem Relation Age of Onset  . Alzheimer's disease Mother   . Cancer Father        esophageal  . Aortic aneurysm Paternal Aunt        2 aunts died at age 80  . Cancer Sister        stomach  . Cancer Paternal Grandfather        mouth & larynx  . Cancer Paternal Aunt        colon  . Heart disease Other   . Diabetes Other   . Hyperlipidemia Other   . Hypertension Other   . Stroke Other   . Thyroid disease Sister   .  Hyperlipidemia Daughter   . Thyroid disease Daughter   . Colon cancer Neg Hx     Social History Social History   Tobacco Use  . Smoking status: Never Smoker  . Smokeless tobacco: Never Used  Substance Use Topics  . Alcohol use: No  . Drug use: No     Allergies   Metformin and related   Review of Systems Review of Systems  Respiratory: Negative for shortness of breath.   Cardiovascular: Negative for chest pain.  Neurological: Negative for headaches.  All other systems reviewed and are negative.    Physical Exam Updated Vital Signs BP (!) 144/73   Pulse 75   Temp (!) 97.4 F (36.3 C) (Oral)   Resp (!) 22   Ht 5\' 2"  (1.575 m)   Wt 64.4 kg   SpO2 97%   BMI 25.97 kg/m   Physical Exam Vitals signs and nursing note reviewed.  Constitutional:      Appearance: She is well-developed.  HENT:     Head: Normocephalic and atraumatic.     Mouth/Throat:     Mouth: Mucous membranes are moist.  Eyes:     Extraocular Movements: Extraocular movements intact.     Conjunctiva/sclera: Conjunctivae normal.  Neck:     Musculoskeletal: Normal range of motion.  Cardiovascular:     Rate and Rhythm: Normal rate and regular rhythm.  Pulmonary:     Effort: No respiratory distress.     Breath sounds: No stridor.  Abdominal:     General: Abdomen is flat. There is no distension.  Musculoskeletal: Normal range of motion.        General: No swelling or tenderness.  Skin:    General: Skin is warm and dry.  Neurological:     Mental Status: She is alert.     Cranial Nerves: No cranial nerve deficit.     Motor: Weakness (right grip decreased) present.      ED Treatments / Results  Labs (all labs ordered are listed, but only abnormal results are displayed) Labs Reviewed  COMPREHENSIVE METABOLIC PANEL - Abnormal; Notable for the following components:      Result Value   Sodium 133 (*)    Potassium 3.3 (*)    CO2 20 (*)    Glucose, Bld 137 (*)    AST 43 (*)    All other  components within normal limits  URINALYSIS, ROUTINE W REFLEX MICROSCOPIC - Abnormal; Notable for the following components:   Specific Gravity, Urine <1.005 (*)    Leukocytes,Ua TRACE (*)    All other components within normal limits  URINALYSIS, MICROSCOPIC (REFLEX) - Abnormal;  Notable for the following components:   Bacteria, UA RARE (*)    All other components within normal limits  ETHANOL  PROTIME-INR  APTT  CBC  DIFFERENTIAL  RAPID URINE DRUG SCREEN, HOSP PERFORMED    EKG None  Radiology Ct Head Wo Contrast  Result Date: 09/28/2018 CLINICAL DATA:  Right upper extremity numbness. EXAM: CT HEAD WITHOUT CONTRAST TECHNIQUE: Contiguous axial images were obtained from the base of the skull through the vertex without intravenous contrast. COMPARISON:  10/11/2016 FINDINGS: Brain: No acute intracranial abnormality. Specifically, no hemorrhage, hydrocephalus, mass lesion, acute infarction, or significant intracranial injury. Vascular: No hyperdense vessel or unexpected calcification. Skull: No acute calvarial abnormality. Sinuses/Orbits: Visualized paranasal sinuses and mastoids clear. Orbital soft tissues unremarkable. Other: None IMPRESSION: No intracranial abnormality. Electronically Signed   By: Rolm Baptise M.D.   On: 09/28/2018 22:22    Procedures Procedures (including critical care time)  Medications Ordered in ED Medications - No data to display   Initial Impression / Assessment and Plan / ED Course  I have reviewed the triage vital signs and the nursing notes.  Pertinent labs & imaging results that were available during my care of the patient were reviewed by me and considered in my medical decision making (see chart for details).   Patient with a history of stroke presents the emergency department today with 2 weeks of progressively worsening right arm paresthesias that turned into numbness and weakness recently.  Today she had an episode of right cheek paresthesias that  resolved prior to my evaluation.  She does have right grip strength weakness but she has normal reflexes normal finger-to-nose no pronator drift.  Work-up here is unremarkable.  I discussed with Dr. Cheral Marker at [facility] who recommends admission to [facility].  Discussed with family medicine resident to accept for admission but there is no telemetry bed so I spoke with Dr. Tyrone Nine in the emergency room who accepted transfer there pending a bed approval.  Family medicine will contact Dr. Cheral Marker when the patient arrives to get further recommendations.  Final Clinical Impressions(s) / ED Diagnoses   Final diagnoses:  None    ED Discharge Orders    None       Nagi Furio, Corene Cornea, MD 09/29/18 [phone]

## 2018-09-29 NOTE — ED Notes (Addendum)
Patient transported to MRI 

## 2018-09-29 NOTE — Consult Note (Signed)
NEURO HOSPITALIST CONSULT NOTE   Requesting physician: Dr. Erin Hearing  Reason for Consult: Right upper extremity sensory numbness and weakness  History obtained from:  Patient and Chart    HPI:                                                                                                                                          Wendy Mcclain is an 68 y.o. female with a prior history of right caudate lacunar stroke in 2018 who initially presented to the [facility] ED on Thursday evening with a c/c of RUE sensory numbness x 2 weeks. She also experienced radiation of the numbness to the lower aspect of the right side of her face. CT head at [facility] was normal. Stroke versus cervical spine pathology were suspected. The patient was therefore transferred to [facility] for MRI and Neurological evaluation. MRI brain was negative for stroke and MRA head was negative for occlusion. However, severe RIGHT > LEFT C6-7 neural foraminal narrowing was seen on MRI of her cervical spine.  MRI HEAD 1. Normal noncontrast MRI head.  MRA HEAD: 1. Normal noncontrast MRA head.  MRI CERVICAL SPINE: 1. No fracture or malalignment. 2. Severe RIGHT C5-6 facet arthropathy no acute inflammatory changes. 3. No canal stenosis. Severe RIGHT > LEFT C6-7 neural foraminal narrowing.  Past Medical History:  Diagnosis Date  . Arrhythmia   . Asthma   . Diabetes mellitus   . GERD (gastroesophageal reflux disease)   . Stroke [facility])     Past Surgical History:  Procedure Laterality Date  . ABDOMINAL HYSTERECTOMY  1993  . APPENDECTOMY  1963  . BREAST ENHANCEMENT SURGERY  1986    Family History  Problem Relation Age of Onset  . Alzheimer's disease Mother   . Cancer Father        esophageal  . Aortic aneurysm Paternal Aunt        2 aunts died at age 64  . Cancer Sister        stomach  . Cancer Paternal Grandfather        mouth & larynx  . Cancer Paternal Aunt        colon  . Heart disease Other    . Diabetes Other   . Hyperlipidemia Other   . Hypertension Other   . Stroke Other   . Thyroid disease Sister   . Hyperlipidemia Daughter   . Thyroid disease Daughter   . Colon cancer Neg Hx               Social History:  reports that she has never smoked. She has never used smokeless tobacco. She reports that she does not drink alcohol or use drugs.  Allergies  Allergen Reactions  . Metformin And Related Diarrhea  HOME MEDICATIONS:                                                                                                                     Current Meds  Medication Sig  . acetaminophen (TYLENOL) 500 MG tablet Take 500-1,000 mg by mouth every 6 (six) hours as needed (for headaches).   Marland Kitchen albuterol (PROVENTIL HFA;VENTOLIN HFA) 108 (90 Base) MCG/ACT inhaler Inhale 2 puffs into the lungs every 6 (six) hours as needed for wheezing or shortness of breath.  Marland Kitchen aspirin EC 325 MG tablet TAKE 1 TABLET(325 MG) BY MOUTH DAILY (Patient taking differently: Take 325 mg by mouth daily. )  . atorvastatin (LIPITOR) 40 MG tablet TAKE 1 TABLET BY MOUTH DAILY AT 6 PM (Patient taking differently: Take 40 mg by mouth daily at 6 PM. )  . cetirizine (ZYRTEC) 10 MG tablet TAKE 1 TABLET BY MOUTH DAILY. (Patient taking differently: Take 10 mg by mouth daily as needed for allergies or rhinitis. )  . Cholecalciferol (VITAMIN D-3) 25 MCG (1000 UT) CAPS Take 1,000 Units by mouth daily.   Marland Kitchen HUMALOG KWIKPEN 100 UNIT/ML KiwkPen Inject 9 Units into the skin 2 (two) times daily before a meal.   . Insulin Degludec (TRESIBA FLEXTOUCH) 100 UNIT/ML SOPN Inject 24 Units into the skin at bedtime.   . Multiple Vitamin (MULTIVITAMIN) tablet Take 1 tablet by mouth 2 (two) times a week.   Marland Kitchen omeprazole (PRILOSEC) 20 MG capsule TAKE 1 CAPSULE(20 MG) BY MOUTH DAILY (Patient taking differently: Take 20 mg by mouth 3 (three) times a week. )  . scopolamine (TRANSDERM-SCOP) 1 MG/3DAYS Place 1 patch (1.5 mg total) onto the skin every  3 (three) days. (Patient taking differently: Place 1 patch onto the skin every 3 (three) days as needed (for sea sickness). )     ROS:                                                                                                                                       No headache. No vision changes. No difficulty with speech or with confusion. Positive for mild intermittent chest pain. No SOB. No fever. No abdominal pain or limb pain. No nausea. No limb weakness except for RUE. Other ROS as per HPI with all other systems negative.  Blood pressure 118/62, pulse 70, temperature (!) 97.4 F (36.3 C), temperature source Oral, resp. rate 18, height 5'  2" (1.575 m), weight 64.4 kg, SpO2 100 %.   General Examination:                                                                                                       Physical Exam  HEENT-  [place]/AT    Lungs- Respirations unlabored Extremities- No edema   Neurological Examination Mental Status: Alert, fully oriented, thought content appropriate.  Pleasant and cooperative. Speech fluent without evidence of aphasia.  Able to follow all commands without difficulty. Cranial Nerves: II: Visual fields intact without extinction to DSS. PERRL.   III,IV, VI: No ptosis. EOMI without nystagmus.  V,VII: No facial droop. Facial temp sensation decreased right V3.  VIII: hearing intact to conversation IX,X: No hypophonia XI: Symmetric shoulder shrug XII: Midline tongue extension Motor: Right : Upper extremity   5/5    Left:     Upper extremity   5/5  Lower extremity   5/5     Lower extremity   5/5 Normal tone throughout; no atrophy noted No pronator drift Sensory: Temp and light touch intact x 4 without extinction.  Deep Tendon Reflexes: 2+ and symmetric bilateral biceps, brachioradialis and patellae. Trace patellar reflexes.  Plantars: Right: downgoing   Left: upgoing versus withdrawing from noxious as patient reacts as though ticklish Cerebellar: No  ataxia with FNF bilaterally Gait: Deferred.    Lab Results: Basic Metabolic Panel: Recent Labs  Lab 09/28/18 2209 09/29/18 0535  NA 133* 138  K 3.3* 3.2*  CL 105 106  CO2 20* 24  GLUCOSE 137* 100*  BUN 10 8  CREATININE 0.68 0.83  CALCIUM 8.9 9.1    CBC: Recent Labs  Lab 09/28/18 2209 09/29/18 0535  WBC 6.1 6.1  NEUTROABS 3.1  --   HGB 13.4 13.2  HCT 41.5 41.1  MCV 92.4 93.6  PLT 256 228    Cardiac Enzymes: Recent Labs  Lab 09/29/18 0229  TROPONINI <0.03    Lipid Panel: Recent Labs  Lab 09/29/18 0229  CHOL 137  TRIG 59  HDL 59  CHOLHDL 2.3  VLDL 12  LDLCALC 66    Imaging: Ct Head Wo Contrast  Result Date: 09/28/2018 CLINICAL DATA:  Right upper extremity numbness. EXAM: CT HEAD WITHOUT CONTRAST TECHNIQUE: Contiguous axial images were obtained from the base of the skull through the vertex without intravenous contrast. COMPARISON:  10/11/2016 FINDINGS: Brain: No acute intracranial abnormality. Specifically, no hemorrhage, hydrocephalus, mass lesion, acute infarction, or significant intracranial injury. Vascular: No hyperdense vessel or unexpected calcification. Skull: No acute calvarial abnormality. Sinuses/Orbits: Visualized paranasal sinuses and mastoids clear. Orbital soft tissues unremarkable. Other: None IMPRESSION: No intracranial abnormality. Electronically Signed   By: Rolm Baptise M.D.   On: 09/28/2018 22:22   Mr Jodene Nam Head Wo Contrast  Result Date: 09/29/2018 CLINICAL DATA:  RIGHT arm weakness, assess for stroke. History of stroke and diabetes. EXAM: MRI HEAD WITHOUT CONTRAST MRA HEAD WITHOUT CONTRAST MRI CERVICAL SPINE WITHOUT CONTRAST TECHNIQUE: Multiplanar, multiecho pulse sequences of the brain and surrounding structures were obtained without intravenous contrast. Angiographic images of the head  were obtained using MRA technique without contrast. Multiplanar, multiecho pulse sequences of the brain and surrounding structures, and cervical spine, to  include the craniocervical junction and cervicothoracic junction, were obtained without intravenous contrast. COMPARISON:  CT HEAD September 28, 2018 and MRI/MRA head October 11, 2016 FINDINGS: MRI HEAD FINDINGS INTRACRANIAL CONTENTS: No reduced diffusion to suggest acute ischemia. No susceptibility artifact to suggest hemorrhage. No parenchymal brain volume loss for age. No hydrocephalus. Prior RIGHT caudate head lacunar infarcts not apparent today. No suspicious parenchymal signal, masses, mass effect. No abnormal extra-axial fluid collections. No extra-axial masses. VASCULAR: Normal major intracranial vascular flow voids present at skull base. SKULL AND UPPER CERVICAL SPINE: No abnormal sellar expansion. No suspicious calvarial bone marrow signal. Craniocervical junction maintained. SINUSES/ORBITS: Mild RIGHT maxillary sinus mucosal thickening without air-fluid levels.The included ocular globes and orbital contents are non-suspicious. Status post bilateral ocular lens implants. OTHER: None. MRA HEAD FINDINGS ANTERIOR CIRCULATION: Normal flow related enhancement of the included cervical, petrous, cavernous and supraclinoid internal carotid arteries. Patent anterior communicating artery. Patent anterior and middle cerebral arteries. No large vessel occlusion, flow limiting stenosis, or aneurysm. POSTERIOR CIRCULATION: LEFT vertebral artery is dominant. Vertebrobasilar arteries are patent, with normal flow related enhancement of the main branch vessels. Patent posterior cerebral arteries. No large vessel occlusion, flow limiting stenosis, or aneurysm. ANATOMIC VARIANTS: None. Source data and MIP images were reviewed. MRI CERVICAL SPINE FINDINGS ALIGNMENT: Maintained cervical lordosis.  No malalignment. VERTEBRAE/DISCS: Vertebral bodies are intact. Moderate C6-7 disc height loss with proportional acute on chronic discogenic endplate changes. Remaining disc morphology is maintained with mild desiccation. Mild chronic  discogenic endplate changes D2-2. Low T1 and bright STIR signal RIGHT C5-6 facet associated with arthropathy. CORD:Cervical spinal cord is normal morphology and signal characteristics from the cervicomedullary junction to level of T3-4, the most caudal well visualized level. POSTERIOR FOSSA, VERTEBRAL ARTERIES, PARASPINAL TISSUES: No MR findings of ligamentous injury. Vertebral artery flow voids present. Included posterior fossa and paraspinal soft tissues are normal. DISC LEVELS: C2-3, C3-4: No disc bulge, canal stenosis nor neural foraminal narrowing. Mild facet arthropathy. C4-5: Small broad-based disc bulge, uncovertebral hypertrophy and moderate to severe facet arthropathy. No canal stenosis or neural foraminal narrowing. C5-6: Small broad-based disc bulge. Severe RIGHT and moderate LEFT facet arthropathy. No canal stenosis or neural foraminal narrowing. C6-7: Small broad-based disc bulge, moderate RIGHT subarticular disc protrusion. Uncovertebral hypertrophy and mild facet arthropathy. No canal stenosis. Severe RIGHT > LEFT neural foraminal narrowing. C7-T1: No disc bulge, canal stenosis nor neural foraminal narrowing. IMPRESSION: MRI HEAD: 1. Normal noncontrast MRI head. MRA HEAD: 1. Normal noncontrast MRA head. MRI CERVICAL SPINE: 1. No fracture or malalignment. 2. Severe RIGHT C5-6 facet arthropathy no acute inflammatory changes. 3. No canal stenosis. Severe RIGHT > LEFT C6-7 neural foraminal narrowing. Electronically Signed   By: Elon Alas M.D.   On: 09/29/2018 05:09   Mr Brain Wo Contrast  Result Date: 09/29/2018 CLINICAL DATA:  RIGHT arm weakness, assess for stroke. History of stroke and diabetes. EXAM: MRI HEAD WITHOUT CONTRAST MRA HEAD WITHOUT CONTRAST MRI CERVICAL SPINE WITHOUT CONTRAST TECHNIQUE: Multiplanar, multiecho pulse sequences of the brain and surrounding structures were obtained without intravenous contrast. Angiographic images of the head were obtained using MRA technique  without contrast. Multiplanar, multiecho pulse sequences of the brain and surrounding structures, and cervical spine, to include the craniocervical junction and cervicothoracic junction, were obtained without intravenous contrast. COMPARISON:  CT HEAD September 28, 2018 and MRI/MRA head October 11, 2016 FINDINGS: MRI  HEAD FINDINGS INTRACRANIAL CONTENTS: No reduced diffusion to suggest acute ischemia. No susceptibility artifact to suggest hemorrhage. No parenchymal brain volume loss for age. No hydrocephalus. Prior RIGHT caudate head lacunar infarcts not apparent today. No suspicious parenchymal signal, masses, mass effect. No abnormal extra-axial fluid collections. No extra-axial masses. VASCULAR: Normal major intracranial vascular flow voids present at skull base. SKULL AND UPPER CERVICAL SPINE: No abnormal sellar expansion. No suspicious calvarial bone marrow signal. Craniocervical junction maintained. SINUSES/ORBITS: Mild RIGHT maxillary sinus mucosal thickening without air-fluid levels.The included ocular globes and orbital contents are non-suspicious. Status post bilateral ocular lens implants. OTHER: None. MRA HEAD FINDINGS ANTERIOR CIRCULATION: Normal flow related enhancement of the included cervical, petrous, cavernous and supraclinoid internal carotid arteries. Patent anterior communicating artery. Patent anterior and middle cerebral arteries. No large vessel occlusion, flow limiting stenosis, or aneurysm. POSTERIOR CIRCULATION: LEFT vertebral artery is dominant. Vertebrobasilar arteries are patent, with normal flow related enhancement of the main branch vessels. Patent posterior cerebral arteries. No large vessel occlusion, flow limiting stenosis, or aneurysm. ANATOMIC VARIANTS: None. Source data and MIP images were reviewed. MRI CERVICAL SPINE FINDINGS ALIGNMENT: Maintained cervical lordosis.  No malalignment. VERTEBRAE/DISCS: Vertebral bodies are intact. Moderate C6-7 disc height loss with proportional  acute on chronic discogenic endplate changes. Remaining disc morphology is maintained with mild desiccation. Mild chronic discogenic endplate changes B2-8. Low T1 and bright STIR signal RIGHT C5-6 facet associated with arthropathy. CORD:Cervical spinal cord is normal morphology and signal characteristics from the cervicomedullary junction to level of T3-4, the most caudal well visualized level. POSTERIOR FOSSA, VERTEBRAL ARTERIES, PARASPINAL TISSUES: No MR findings of ligamentous injury. Vertebral artery flow voids present. Included posterior fossa and paraspinal soft tissues are normal. DISC LEVELS: C2-3, C3-4: No disc bulge, canal stenosis nor neural foraminal narrowing. Mild facet arthropathy. C4-5: Small broad-based disc bulge, uncovertebral hypertrophy and moderate to severe facet arthropathy. No canal stenosis or neural foraminal narrowing. C5-6: Small broad-based disc bulge. Severe RIGHT and moderate LEFT facet arthropathy. No canal stenosis or neural foraminal narrowing. C6-7: Small broad-based disc bulge, moderate RIGHT subarticular disc protrusion. Uncovertebral hypertrophy and mild facet arthropathy. No canal stenosis. Severe RIGHT > LEFT neural foraminal narrowing. C7-T1: No disc bulge, canal stenosis nor neural foraminal narrowing. IMPRESSION: MRI HEAD: 1. Normal noncontrast MRI head. MRA HEAD: 1. Normal noncontrast MRA head. MRI CERVICAL SPINE: 1. No fracture or malalignment. 2. Severe RIGHT C5-6 facet arthropathy no acute inflammatory changes. 3. No canal stenosis. Severe RIGHT > LEFT C6-7 neural foraminal narrowing. Electronically Signed   By: Elon Alas M.D.   On: 09/29/2018 05:09   Mr Cervical Spine Wo Contrast  Result Date: 09/29/2018 CLINICAL DATA:  RIGHT arm weakness, assess for stroke. History of stroke and diabetes. EXAM: MRI HEAD WITHOUT CONTRAST MRA HEAD WITHOUT CONTRAST MRI CERVICAL SPINE WITHOUT CONTRAST TECHNIQUE: Multiplanar, multiecho pulse sequences of the brain and  surrounding structures were obtained without intravenous contrast. Angiographic images of the head were obtained using MRA technique without contrast. Multiplanar, multiecho pulse sequences of the brain and surrounding structures, and cervical spine, to include the craniocervical junction and cervicothoracic junction, were obtained without intravenous contrast. COMPARISON:  CT HEAD September 28, 2018 and MRI/MRA head October 11, 2016 FINDINGS: MRI HEAD FINDINGS INTRACRANIAL CONTENTS: No reduced diffusion to suggest acute ischemia. No susceptibility artifact to suggest hemorrhage. No parenchymal brain volume loss for age. No hydrocephalus. Prior RIGHT caudate head lacunar infarcts not apparent today. No suspicious parenchymal signal, masses, mass effect. No abnormal extra-axial fluid collections. No  extra-axial masses. VASCULAR: Normal major intracranial vascular flow voids present at skull base. SKULL AND UPPER CERVICAL SPINE: No abnormal sellar expansion. No suspicious calvarial bone marrow signal. Craniocervical junction maintained. SINUSES/ORBITS: Mild RIGHT maxillary sinus mucosal thickening without air-fluid levels.The included ocular globes and orbital contents are non-suspicious. Status post bilateral ocular lens implants. OTHER: None. MRA HEAD FINDINGS ANTERIOR CIRCULATION: Normal flow related enhancement of the included cervical, petrous, cavernous and supraclinoid internal carotid arteries. Patent anterior communicating artery. Patent anterior and middle cerebral arteries. No large vessel occlusion, flow limiting stenosis, or aneurysm. POSTERIOR CIRCULATION: LEFT vertebral artery is dominant. Vertebrobasilar arteries are patent, with normal flow related enhancement of the main branch vessels. Patent posterior cerebral arteries. No large vessel occlusion, flow limiting stenosis, or aneurysm. ANATOMIC VARIANTS: None. Source data and MIP images were reviewed. MRI CERVICAL SPINE FINDINGS ALIGNMENT: Maintained  cervical lordosis.  No malalignment. VERTEBRAE/DISCS: Vertebral bodies are intact. Moderate C6-7 disc height loss with proportional acute on chronic discogenic endplate changes. Remaining disc morphology is maintained with mild desiccation. Mild chronic discogenic endplate changes O0-3. Low T1 and bright STIR signal RIGHT C5-6 facet associated with arthropathy. CORD:Cervical spinal cord is normal morphology and signal characteristics from the cervicomedullary junction to level of T3-4, the most caudal well visualized level. POSTERIOR FOSSA, VERTEBRAL ARTERIES, PARASPINAL TISSUES: No MR findings of ligamentous injury. Vertebral artery flow voids present. Included posterior fossa and paraspinal soft tissues are normal. DISC LEVELS: C2-3, C3-4: No disc bulge, canal stenosis nor neural foraminal narrowing. Mild facet arthropathy. C4-5: Small broad-based disc bulge, uncovertebral hypertrophy and moderate to severe facet arthropathy. No canal stenosis or neural foraminal narrowing. C5-6: Small broad-based disc bulge. Severe RIGHT and moderate LEFT facet arthropathy. No canal stenosis or neural foraminal narrowing. C6-7: Small broad-based disc bulge, moderate RIGHT subarticular disc protrusion. Uncovertebral hypertrophy and mild facet arthropathy. No canal stenosis. Severe RIGHT > LEFT neural foraminal narrowing. C7-T1: No disc bulge, canal stenosis nor neural foraminal narrowing. IMPRESSION: MRI HEAD: 1. Normal noncontrast MRI head. MRA HEAD: 1. Normal noncontrast MRA head. MRI CERVICAL SPINE: 1. No fracture or malalignment. 2. Severe RIGHT C5-6 facet arthropathy no acute inflammatory changes. 3. No canal stenosis. Severe RIGHT > LEFT C6-7 neural foraminal narrowing. Electronically Signed   By: Elon Alas M.D.   On: 09/29/2018 05:09    Assessment: 68 year old female with subjective RUE weakness and numbness 1. Neurological exam nonfocal 2. MRI brain is negative for stroke. MRA head normal 3. MRI cervical  spine with severe RIGHT > LEFT C6-7 neural foraminal narrowing.  Recommendations: 1. Outpatient Neurosurgery follow up for consideration of possible right C7 nerve root decompression.  2. B12, vitamin E and copper levels, which should be followed up by her PCP.    Electronically signed: Dr. Kerney Elbe 09/29/2018, 8:30 AM

## 2018-09-29 NOTE — Discharge Summary (Addendum)
[facility] Discharge Summary  Patient name: Wendy Mcclain record number: [id number] Date of birth: Oct 06, 1950 Age: 68 y.o. Gender: female Date of Admission: 09/28/2018  Date of Discharge: 09/29/2018 Admitting Physician: Lind Covert, MD  Primary Care Provider: Kathrene Alu, MD Consultants: Neurology  Indication for Hospitalization: Right arm numbness, tingling  Discharge Diagnoses/Problem List:  Severe right C5-6 facet arthropathy; severe right C6-7 neural foraminal narrowing causing Radiculopathy Type 2 diabetes Elevated blood pressure (not hypertension) TIA CVA (2018) Asthma GERD  Disposition: Discharge home  Discharge Condition: Stable  Discharge Exam:  BP 131/70 (BP Location: Left Arm)   Pulse 78   Temp (!) 97.5 F (36.4 C) (Oral)   Resp 18   Ht 5\' 2"  (1.575 m)   Wt 64.4 kg   SpO2 97%   BMI 25.97 kg/m  Unchanged from admission earlier this day, c/p'ed from Dr. Tonette Bihari PE earlier.  Constitutional:      General: She is not in acute distress.    Appearance: She is not ill-appearing.  Eyes:     Extraocular Movements: Extraocular movements intact.     Pupils: Pupils are equal, round, and reactive to light.  Neck:     Comments: Limited ROM in bilateral sidebending (ear to shoulder); equivocal R [place]  Cardiovascular:     Rate and Rhythm: Normal rate and regular rhythm.     Pulses: Normal pulses.     Heart sounds: Normal heart sounds. No murmur. No friction rub. No gallop.   Pulmonary:     Effort: Pulmonary effort is normal. No respiratory distress.     Breath sounds: Normal breath sounds.  Abdominal:     General: Bowel sounds are normal.     Palpations: Abdomen is soft.  Musculoskeletal:     Right lower leg: No edema.     Left lower leg: No edema.  Neurological:     General: No focal deficit present.     Mental Status: She is oriented to person, place, and time.     Cranial Nerves: No cranial nerve  deficit.     Motor: No weakness.     Coordination: Coordination normal.   Brief Hospital Course:  Wendy Mcclain is a 68 y.o. female presenting for right arm numbness/weakness. Patient presented to ED at [facility] after calling PCP office to inform them of symptoms. Was told by PCP office to go to ED to r/o stroke given her history of previous CVA in 2018. Patient stated that she has had symptoms of R arm numbness/weakness/pain x2 weeks and began to have right sided facial numbness the day of admission. In ED at [facility], neurology was called and recommended MRI with neurology consult. Patient required transfer to [facility] given lack of imaging or neurology at [facility].   CT head was negative. MRI/MRA of brain normal contrast imaging. Neurology saw patient and recommended o/p neurosurgery follow up for consideration of possible right C7 nerve root decompression. .   During exam it was noted that patient also had significant cervical tenderness. Given cervical pain with associated radiculopathy, spinal stenosis or nerve impingement was considered. MRI C spine showed severe right C5-6 facet arthropathy, as well as severe right C6-7 neural foraminal narrowing.  Issues for Follow Up:  1. Re-check TSH in 4 weeks 2. A1C elevated to 9.1 on admission, consider changing diabetic regimen  Neurology Recommendations 1. Outpatient Neurosurgery follow up for consideration of possible right C7 nerve root decompression.  2. B12, vitamin E and  copper levels, which should be followed up by her PCP.   Significant Procedures: None  Significant Labs and Imaging:  Recent Labs  Lab 09/28/18 2209 09/29/18 0535  WBC 6.1 6.1  HGB 13.4 13.2  HCT 41.5 41.1  PLT 256 228   Recent Labs  Lab 09/28/18 2209 09/29/18 0535  NA 133* 138  K 3.3* 3.2*  CL 105 106  CO2 20* 24  GLUCOSE 137* 100*  BUN 10 8  CREATININE 0.68 0.83  CALCIUM 8.9 9.1  ALKPHOS 100  --   AST 43*  --   ALT 33  --   ALBUMIN 4.2  --     Ref. Range  09/29/2018 02:29  Troponin I Latest Ref Range: <0.03 ng/mL <0.03    Ref. Range 09/29/2018 02:29  Total CHOL/HDL Ratio Latest Units: RATIO 2.3  Cholesterol Latest Ref Range: 0 - 200 mg/dL 137  HDL Cholesterol Latest Ref Range: >40 mg/dL 59  LDL (calc) Latest Ref Range: 0 - 99 mg/dL 66  Triglycerides Latest Ref Range: <150 mg/dL 59  VLDL Latest Ref Range: 0 - 40 mg/dL 12    Ref. Range 09/29/2018 02:29  Hemoglobin A1C Latest Ref Range: 4.8 - 5.6 % 9.1 (H)    Ref. Range 09/29/2018 02:29  TSH Latest Ref Range: 0.350 - 4.500 uIU/mL 5.173 (H)   Urinalysis    Component Value Date/Time   COLORURINE YELLOW 09/28/2018 2220   APPEARANCEUR CLEAR 09/28/2018 2220   LABSPEC <1.005 (L) 09/28/2018 2220   PHURINE 6.0 09/28/2018 2220   GLUCOSEU NEGATIVE 09/28/2018 2220   HGBUR NEGATIVE 09/28/2018 2220   BILIRUBINUR NEGATIVE 09/28/2018 2220   KETONESUR NEGATIVE 09/28/2018 2220   PROTEINUR NEGATIVE 09/28/2018 2220   NITRITE NEGATIVE 09/28/2018 2220   LEUKOCYTESUR TRACE (A) 09/28/2018 2220   Drugs of Abuse     Component Value Date/Time   LABOPIA NONE DETECTED 09/28/2018 2220   COCAINSCRNUR NONE DETECTED 09/28/2018 2220   LABBENZ NONE DETECTED 09/28/2018 2220   AMPHETMU NONE DETECTED 09/28/2018 2220   THCU NONE DETECTED 09/28/2018 2220   LABBARB NONE DETECTED 09/28/2018 2220    Alcohol Level    Component Value Date/Time   ETH <10 09/28/2018 2209   Ct Head Wo Contrast  Result Date: 09/28/2018 CLINICAL DATA:  Right upper extremity numbness. EXAM: CT HEAD WITHOUT CONTRAST TECHNIQUE: Contiguous axial images were obtained from the base of the skull through the vertex without intravenous contrast. COMPARISON:  10/11/2016 FINDINGS: Brain: No acute intracranial abnormality. Specifically, no hemorrhage, hydrocephalus, mass lesion, acute infarction, or significant intracranial injury. Vascular: No hyperdense vessel or unexpected calcification. Skull: No acute calvarial abnormality. Sinuses/Orbits:  Visualized paranasal sinuses and mastoids clear. Orbital soft tissues unremarkable. Other: None IMPRESSION: No intracranial abnormality. Electronically Signed   By: Rolm Baptise M.D.   On: 09/28/2018 22:22   Mr Jodene Nam Head Wo Contrast  Result Date: 09/29/2018 CLINICAL DATA:  RIGHT arm weakness, assess for stroke. History of stroke and diabetes. EXAM: MRI HEAD WITHOUT CONTRAST MRA HEAD WITHOUT CONTRAST MRI CERVICAL SPINE WITHOUT CONTRAST TECHNIQUE: Multiplanar, multiecho pulse sequences of the brain and surrounding structures were obtained without intravenous contrast. Angiographic images of the head were obtained using MRA technique without contrast. Multiplanar, multiecho pulse sequences of the brain and surrounding structures, and cervical spine, to include the craniocervical junction and cervicothoracic junction, were obtained without intravenous contrast. COMPARISON:  CT HEAD September 28, 2018 and MRI/MRA head October 11, 2016 FINDINGS: MRI HEAD FINDINGS INTRACRANIAL CONTENTS: No reduced diffusion to suggest acute ischemia.  No susceptibility artifact to suggest hemorrhage. No parenchymal brain volume loss for age. No hydrocephalus. Prior RIGHT caudate head lacunar infarcts not apparent today. No suspicious parenchymal signal, masses, mass effect. No abnormal extra-axial fluid collections. No extra-axial masses. VASCULAR: Normal major intracranial vascular flow voids present at skull base. SKULL AND UPPER CERVICAL SPINE: No abnormal sellar expansion. No suspicious calvarial bone marrow signal. Craniocervical junction maintained. SINUSES/ORBITS: Mild RIGHT maxillary sinus mucosal thickening without air-fluid levels.The included ocular globes and orbital contents are non-suspicious. Status post bilateral ocular lens implants. OTHER: None. MRA HEAD FINDINGS ANTERIOR CIRCULATION: Normal flow related enhancement of the included cervical, petrous, cavernous and supraclinoid internal carotid arteries. Patent anterior  communicating artery. Patent anterior and middle cerebral arteries. No large vessel occlusion, flow limiting stenosis, or aneurysm. POSTERIOR CIRCULATION: LEFT vertebral artery is dominant. Vertebrobasilar arteries are patent, with normal flow related enhancement of the main branch vessels. Patent posterior cerebral arteries. No large vessel occlusion, flow limiting stenosis, or aneurysm. ANATOMIC VARIANTS: None. Source data and MIP images were reviewed. MRI CERVICAL SPINE FINDINGS ALIGNMENT: Maintained cervical lordosis.  No malalignment. VERTEBRAE/DISCS: Vertebral bodies are intact. Moderate C6-7 disc height loss with proportional acute on chronic discogenic endplate changes. Remaining disc morphology is maintained with mild desiccation. Mild chronic discogenic endplate changes X6-4. Low T1 and bright STIR signal RIGHT C5-6 facet associated with arthropathy. CORD:Cervical spinal cord is normal morphology and signal characteristics from the cervicomedullary junction to level of T3-4, the most caudal well visualized level. POSTERIOR FOSSA, VERTEBRAL ARTERIES, PARASPINAL TISSUES: No MR findings of ligamentous injury. Vertebral artery flow voids present. Included posterior fossa and paraspinal soft tissues are normal. DISC LEVELS: C2-3, C3-4: No disc bulge, canal stenosis nor neural foraminal narrowing. Mild facet arthropathy. C4-5: Small broad-based disc bulge, uncovertebral hypertrophy and moderate to severe facet arthropathy. No canal stenosis or neural foraminal narrowing. C5-6: Small broad-based disc bulge. Severe RIGHT and moderate LEFT facet arthropathy. No canal stenosis or neural foraminal narrowing. C6-7: Small broad-based disc bulge, moderate RIGHT subarticular disc protrusion. Uncovertebral hypertrophy and mild facet arthropathy. No canal stenosis. Severe RIGHT > LEFT neural foraminal narrowing. C7-T1: No disc bulge, canal stenosis nor neural foraminal narrowing. IMPRESSION: MRI HEAD: 1. Normal noncontrast  MRI head. MRA HEAD: 1. Normal noncontrast MRA head. MRI CERVICAL SPINE: 1. No fracture or malalignment. 2. Severe RIGHT C5-6 facet arthropathy no acute inflammatory changes. 3. No canal stenosis. Severe RIGHT > LEFT C6-7 neural foraminal narrowing. Electronically Signed   By: Elon Alas M.D.   On: 09/29/2018 05:09   Mr Brain Wo Contrast  Result Date: 09/29/2018 CLINICAL DATA:  RIGHT arm weakness, assess for stroke. History of stroke and diabetes. EXAM: MRI HEAD WITHOUT CONTRAST MRA HEAD WITHOUT CONTRAST MRI CERVICAL SPINE WITHOUT CONTRAST TECHNIQUE: Multiplanar, multiecho pulse sequences of the brain and surrounding structures were obtained without intravenous contrast. Angiographic images of the head were obtained using MRA technique without contrast. Multiplanar, multiecho pulse sequences of the brain and surrounding structures, and cervical spine, to include the craniocervical junction and cervicothoracic junction, were obtained without intravenous contrast. COMPARISON:  CT HEAD September 28, 2018 and MRI/MRA head October 11, 2016 FINDINGS: MRI HEAD FINDINGS INTRACRANIAL CONTENTS: No reduced diffusion to suggest acute ischemia. No susceptibility artifact to suggest hemorrhage. No parenchymal brain volume loss for age. No hydrocephalus. Prior RIGHT caudate head lacunar infarcts not apparent today. No suspicious parenchymal signal, masses, mass effect. No abnormal extra-axial fluid collections. No extra-axial masses. VASCULAR: Normal major intracranial vascular flow voids present at skull  base. SKULL AND UPPER CERVICAL SPINE: No abnormal sellar expansion. No suspicious calvarial bone marrow signal. Craniocervical junction maintained. SINUSES/ORBITS: Mild RIGHT maxillary sinus mucosal thickening without air-fluid levels.The included ocular globes and orbital contents are non-suspicious. Status post bilateral ocular lens implants. OTHER: None. MRA HEAD FINDINGS ANTERIOR CIRCULATION: Normal flow related  enhancement of the included cervical, petrous, cavernous and supraclinoid internal carotid arteries. Patent anterior communicating artery. Patent anterior and middle cerebral arteries. No large vessel occlusion, flow limiting stenosis, or aneurysm. POSTERIOR CIRCULATION: LEFT vertebral artery is dominant. Vertebrobasilar arteries are patent, with normal flow related enhancement of the main branch vessels. Patent posterior cerebral arteries. No large vessel occlusion, flow limiting stenosis, or aneurysm. ANATOMIC VARIANTS: None. Source data and MIP images were reviewed. MRI CERVICAL SPINE FINDINGS ALIGNMENT: Maintained cervical lordosis.  No malalignment. VERTEBRAE/DISCS: Vertebral bodies are intact. Moderate C6-7 disc height loss with proportional acute on chronic discogenic endplate changes. Remaining disc morphology is maintained with mild desiccation. Mild chronic discogenic endplate changes H9-6. Low T1 and bright STIR signal RIGHT C5-6 facet associated with arthropathy. CORD:Cervical spinal cord is normal morphology and signal characteristics from the cervicomedullary junction to level of T3-4, the most caudal well visualized level. POSTERIOR FOSSA, VERTEBRAL ARTERIES, PARASPINAL TISSUES: No MR findings of ligamentous injury. Vertebral artery flow voids present. Included posterior fossa and paraspinal soft tissues are normal. DISC LEVELS: C2-3, C3-4: No disc bulge, canal stenosis nor neural foraminal narrowing. Mild facet arthropathy. C4-5: Small broad-based disc bulge, uncovertebral hypertrophy and moderate to severe facet arthropathy. No canal stenosis or neural foraminal narrowing. C5-6: Small broad-based disc bulge. Severe RIGHT and moderate LEFT facet arthropathy. No canal stenosis or neural foraminal narrowing. C6-7: Small broad-based disc bulge, moderate RIGHT subarticular disc protrusion. Uncovertebral hypertrophy and mild facet arthropathy. No canal stenosis. Severe RIGHT > LEFT neural foraminal  narrowing. C7-T1: No disc bulge, canal stenosis nor neural foraminal narrowing. IMPRESSION: MRI HEAD: 1. Normal noncontrast MRI head. MRA HEAD: 1. Normal noncontrast MRA head. MRI CERVICAL SPINE: 1. No fracture or malalignment. 2. Severe RIGHT C5-6 facet arthropathy no acute inflammatory changes. 3. No canal stenosis. Severe RIGHT > LEFT C6-7 neural foraminal narrowing. Electronically Signed   By: Elon Alas M.D.   On: 09/29/2018 05:09   Mr Cervical Spine Wo Contrast  Result Date: 09/29/2018 CLINICAL DATA:  RIGHT arm weakness, assess for stroke. History of stroke and diabetes. EXAM: MRI HEAD WITHOUT CONTRAST MRA HEAD WITHOUT CONTRAST MRI CERVICAL SPINE WITHOUT CONTRAST TECHNIQUE: Multiplanar, multiecho pulse sequences of the brain and surrounding structures were obtained without intravenous contrast. Angiographic images of the head were obtained using MRA technique without contrast. Multiplanar, multiecho pulse sequences of the brain and surrounding structures, and cervical spine, to include the craniocervical junction and cervicothoracic junction, were obtained without intravenous contrast. COMPARISON:  CT HEAD September 28, 2018 and MRI/MRA head October 11, 2016 FINDINGS: MRI HEAD FINDINGS INTRACRANIAL CONTENTS: No reduced diffusion to suggest acute ischemia. No susceptibility artifact to suggest hemorrhage. No parenchymal brain volume loss for age. No hydrocephalus. Prior RIGHT caudate head lacunar infarcts not apparent today. No suspicious parenchymal signal, masses, mass effect. No abnormal extra-axial fluid collections. No extra-axial masses. VASCULAR: Normal major intracranial vascular flow voids present at skull base. SKULL AND UPPER CERVICAL SPINE: No abnormal sellar expansion. No suspicious calvarial bone marrow signal. Craniocervical junction maintained. SINUSES/ORBITS: Mild RIGHT maxillary sinus mucosal thickening without air-fluid levels.The included ocular globes and orbital contents are  non-suspicious. Status post bilateral ocular lens implants. OTHER: None. MRA HEAD  FINDINGS ANTERIOR CIRCULATION: Normal flow related enhancement of the included cervical, petrous, cavernous and supraclinoid internal carotid arteries. Patent anterior communicating artery. Patent anterior and middle cerebral arteries. No large vessel occlusion, flow limiting stenosis, or aneurysm. POSTERIOR CIRCULATION: LEFT vertebral artery is dominant. Vertebrobasilar arteries are patent, with normal flow related enhancement of the main branch vessels. Patent posterior cerebral arteries. No large vessel occlusion, flow limiting stenosis, or aneurysm. ANATOMIC VARIANTS: None. Source data and MIP images were reviewed. MRI CERVICAL SPINE FINDINGS ALIGNMENT: Maintained cervical lordosis.  No malalignment. VERTEBRAE/DISCS: Vertebral bodies are intact. Moderate C6-7 disc height loss with proportional acute on chronic discogenic endplate changes. Remaining disc morphology is maintained with mild desiccation. Mild chronic discogenic endplate changes E9-5. Low T1 and bright STIR signal RIGHT C5-6 facet associated with arthropathy. CORD:Cervical spinal cord is normal morphology and signal characteristics from the cervicomedullary junction to level of T3-4, the most caudal well visualized level. POSTERIOR FOSSA, VERTEBRAL ARTERIES, PARASPINAL TISSUES: No MR findings of ligamentous injury. Vertebral artery flow voids present. Included posterior fossa and paraspinal soft tissues are normal. DISC LEVELS: C2-3, C3-4: No disc bulge, canal stenosis nor neural foraminal narrowing. Mild facet arthropathy. C4-5: Small broad-based disc bulge, uncovertebral hypertrophy and moderate to severe facet arthropathy. No canal stenosis or neural foraminal narrowing. C5-6: Small broad-based disc bulge. Severe RIGHT and moderate LEFT facet arthropathy. No canal stenosis or neural foraminal narrowing. C6-7: Small broad-based disc bulge, moderate RIGHT subarticular  disc protrusion. Uncovertebral hypertrophy and mild facet arthropathy. No canal stenosis. Severe RIGHT > LEFT neural foraminal narrowing. C7-T1: No disc bulge, canal stenosis nor neural foraminal narrowing. IMPRESSION: MRI HEAD: 1. Normal noncontrast MRI head. MRA HEAD: 1. Normal noncontrast MRA head. MRI CERVICAL SPINE: 1. No fracture or malalignment. 2. Severe RIGHT C5-6 facet arthropathy no acute inflammatory changes. 3. No canal stenosis. Severe RIGHT > LEFT C6-7 neural foraminal narrowing. Electronically Signed   By: Elon Alas M.D.   On: 09/29/2018 05:09   Results/Tests Pending at Time of Discharge: HIV antibody  Discharge Medications:  Allergies as of 09/29/2018      Reactions   Metformin And Related Diarrhea      Medication List    TAKE these medications   acetaminophen 500 MG tablet Commonly known as:  TYLENOL Take 500-1,000 mg by mouth every 6 (six) hours as needed (for headaches).   albuterol 108 (90 Base) MCG/ACT inhaler Commonly known as:  PROVENTIL HFA;VENTOLIN HFA Inhale 2 puffs into the lungs every 6 (six) hours as needed for wheezing or shortness of breath.   aspirin EC 325 MG tablet TAKE 1 TABLET(325 MG) BY MOUTH DAILY What changed:  See the new instructions.   atorvastatin 40 MG tablet Commonly known as:  LIPITOR TAKE 1 TABLET BY MOUTH DAILY AT 6 PM What changed:    how much to take  how to take this  when to take this  additional instructions   cetirizine 10 MG tablet Commonly known as:  ZYRTEC Take 1 tablet (10 mg total) by mouth daily. What changed:  Another medication with the same name was changed. Make sure you understand how and when to take each.   cetirizine 10 MG tablet Commonly known as:  ZYRTEC TAKE 1 TABLET BY MOUTH DAILY. What changed:    when to take this  reasons to take this   HUMALOG KWIKPEN 100 UNIT/ML KwikPen Generic drug:  insulin lispro Inject 9 Units into the skin 2 (two) times daily before a meal.   multivitamin  tablet Take 1 tablet by mouth 2 (two) times a week.   omeprazole 20 MG capsule Commonly known as:  PRILOSEC TAKE 1 CAPSULE(20 MG) BY MOUTH DAILY What changed:    See the new instructions.  Another medication with the same name was removed. Continue taking this medication, and follow the directions you see here.   scopolamine 1 MG/3DAYS Commonly known as:  TRANSDERM-SCOP Place 1 patch (1.5 mg total) onto the skin every 3 (three) days. What changed:    when to take this  reasons to take this   TRESIBA FLEXTOUCH 100 UNIT/ML Sopn FlexTouch Pen Generic drug:  insulin degludec Inject 24 Units into the skin at bedtime.   Vitamin D-3 25 MCG (1000 UT) Caps Take 1,000 Units by mouth daily.      Discharge Instructions: Please refer to Patient Instructions section of EMR for full details.  Patient was counseled important signs and symptoms that should prompt return to medical care, changes in medications, dietary instructions, activity restrictions, and follow up appointments.   Follow-Up Appointments: Follow-up Information    Wilber Oliphant, MD. Go on 10/04/2018.   Specialty:  Family Medicine Why:  @2 :25PM (Please arrive at least 5min early) Contact information: [address] [place] [address] [phone]          Wilber Oliphant, MD 09/29/2018, 2:07 PM PGY-1, [place]

## 2018-09-29 NOTE — ED Notes (Signed)
Breakfast ordered 

## 2018-09-29 NOTE — ED Notes (Signed)
[facility] arrived to transfer pt to [facility]

## 2018-09-29 NOTE — ED Notes (Signed)
Paged [facility]

## 2018-09-29 NOTE — ED Notes (Signed)
Patient from [place] for MRI. Patient c/o numbness and tingling in bilateral arms and legs x 2 weeks. Patient with steady gait noted at this time. Denies any other symptoms.   BP 144/74 NSR 97% RA

## 2018-09-29 NOTE — Evaluation (Signed)
Physical Therapy Evaluation and Discharge Patient Details Name: Wendy Mcclain MRN: [id number] DOB: 1951/01/21 Today's Date: 09/29/2018   History of Present Illness  Pt is a 68 y/o female admitted secondary to increased numbness and pain in RUE. MRI and MRA of head normal. MRI of C-spine revealed severe R>L C6-7 foraminal narrowing. PMH includes CVA, DM, and asthma.   Clinical Impression  Patient evaluated by Physical Therapy with no further acute PT needs identified. All education has been completed and the patient has no further questions. Pt at a supervision to independent for all mobility this session. Pt scored 21 on DGI indicating low fall risk. Pt reports being at baseline level for mobility tasks. See below for any follow-up Physical Therapy or equipment needs. PT is signing off. Thank you for this referral. If needs change, please re-consult.      Follow Up Recommendations No PT follow up    Equipment Recommendations  None recommended by PT    Recommendations for Other Services       Precautions / Restrictions Precautions Precautions: None Restrictions Weight Bearing Restrictions: No      Mobility  Bed Mobility Overal bed mobility: Independent                Transfers Overall transfer level: Independent                  Ambulation/Gait Ambulation/Gait assistance: Modified independent (Device/Increase time) Gait Distance (Feet): 400 Feet Assistive device: None Gait Pattern/deviations: WFL(Within Functional Limits) Gait velocity: Slightly decreased   General Gait Details: Overall steady when performing dynamic gait tasks of DGI. Slightly slower gait speed, however, able to speed up with cues.   Stairs Stairs: Yes Stairs assistance: Supervision Stair Management: One rail Left;Alternating pattern;Forwards Number of Stairs: 4 General stair comments: Overall steady stair navigation using rail. Supervision for safety.   Wheelchair Mobility     Modified Rankin (Stroke Patients Only)       Balance Overall balance assessment: Modified Independent                               Standardized Balance Assessment Standardized Balance Assessment : Dynamic Gait Index   Dynamic Gait Index Level Surface: Mild Impairment Change in Gait Speed: Normal Gait with Horizontal Head Turns: Normal Gait with Vertical Head Turns: Normal Gait and Pivot Turn: Mild Impairment Step Over Obstacle: Normal Step Around Obstacles: Normal Steps: Mild Impairment Total Score: 21       Pertinent Vitals/Pain Pain Assessment: Faces Faces Pain Scale: Hurts little more Pain Location: R arm Pain Descriptors / Indicators: Radiating Pain Intervention(s): Limited activity within patient's tolerance;Monitored during session;Repositioned    Home Living Family/patient expects to be discharged to:: Private residence Living Arrangements: Alone Available Help at Discharge: Family;Available PRN/intermittently Type of Home: House Home Access: Stairs to enter Entrance Stairs-Rails: Right;Left;Can reach both Entrance Stairs-Number of Steps: 4 Home Layout: Two level;Able to live on main level with bedroom/bathroom Home Equipment: None      Prior Function Level of Independence: Independent               Hand Dominance        Extremity/Trunk Assessment   Upper Extremity Assessment Upper Extremity Assessment: RUE deficits/detail RUE Deficits / Details: Reports tingling and radiating pain from shoulder to hand.     Lower Extremity Assessment Lower Extremity Assessment: Overall WFL for tasks assessed    Cervical / Trunk Assessment  Cervical / Trunk Assessment: Normal  Communication   Communication: No difficulties  Cognition Arousal/Alertness: Awake/alert Behavior During Therapy: WFL for tasks assessed/performed Overall Cognitive Status: Within Functional Limits for tasks assessed                                         General Comments      Exercises     Assessment/Plan    PT Assessment Patent does not need any further PT services  PT Problem List         PT Treatment Interventions      PT Goals (Current goals can be found in the Care Plan section)  Acute Rehab PT Goals Patient Stated Goal: to go home  PT Goal Formulation: All assessment and education complete, DC therapy Time For Goal Achievement: 09/29/18 Potential to Achieve Goals: Good    Frequency     Barriers to discharge        Co-evaluation               AM-PAC PT "6 Clicks" Mobility  Outcome Measure Help needed turning from your back to your side while in a flat bed without using bedrails?: None Help needed moving from lying on your back to sitting on the side of a flat bed without using bedrails?: None Help needed moving to and from a bed to a chair (including a wheelchair)?: None Help needed standing up from a chair using your arms (e.g., wheelchair or bedside chair)?: None Help needed to walk in hospital room?: None Help needed climbing 3-5 steps with a railing? : None 6 Click Score: 24    End of Session Equipment Utilized During Treatment: Gait belt Activity Tolerance: Patient tolerated treatment well Patient left: in bed;with call bell/phone within reach Nurse Communication: Mobility status PT Visit Diagnosis: Other symptoms and signs involving the nervous system (R29.898)    Time: 3838-1840 PT Time Calculation (min) (ACUTE ONLY): 13 min   Charges:   PT Evaluation $PT Eval Low Complexity: [place], PT, DPT  Acute Rehabilitation Services  Pager: [phone] Office: [phone]   Rudean Hitt 09/29/2018, 11:09 AM

## 2018-10-02 ENCOUNTER — Inpatient Hospital Stay: Payer: Medicare Other

## 2018-10-04 ENCOUNTER — Inpatient Hospital Stay: Payer: Medicare Other | Admitting: Family Medicine

## 2018-10-04 ENCOUNTER — Encounter: Payer: Self-pay | Admitting: Family Medicine

## 2018-10-04 ENCOUNTER — Ambulatory Visit (INDEPENDENT_AMBULATORY_CARE_PROVIDER_SITE_OTHER): Payer: Medicare Other | Admitting: Family Medicine

## 2018-10-04 ENCOUNTER — Other Ambulatory Visit: Payer: Self-pay

## 2018-10-04 VITALS — BP 120/70 | HR 76 | Temp 97.5°F | Wt 142.0 lb

## 2018-10-04 DIAGNOSIS — M9981 Other biomechanical lesions of cervical region: Secondary | ICD-10-CM | POA: Diagnosis not present

## 2018-10-04 DIAGNOSIS — R7989 Other specified abnormal findings of blood chemistry: Secondary | ICD-10-CM | POA: Diagnosis not present

## 2018-10-04 DIAGNOSIS — G5601 Carpal tunnel syndrome, right upper limb: Secondary | ICD-10-CM | POA: Diagnosis not present

## 2018-10-04 DIAGNOSIS — M4802 Spinal stenosis, cervical region: Secondary | ICD-10-CM

## 2018-10-04 NOTE — Patient Instructions (Addendum)
Dear Wendy Mcclain,   It was very nice to see you! Thank you for taking your time to come in to be seen. Today, we discussed the following:   Hospital Follow up   Carpal Tunnel  Cervical spine narrowing   Try not to overuse your right arm   Ice and rest   No Ibuprofen for more than 5 days at a time. Try to avoid in general  Tylenol if pain   Please return for concerning or worsening symptoms.   Be well,   Dr. Zettie Cooley [facility] [phone]   Sign up for MyChart for instant access to your health profile, labs, orders, upcoming appointments or to contact your provider with questions.     Carpal Tunnel Syndrome  Carpal tunnel syndrome is a condition that causes pain in your hand and arm. The carpal tunnel is a narrow area located on the palm side of your wrist. Repeated wrist motion or certain diseases may cause swelling within the tunnel. This swelling pinches the main nerve in the wrist (median nerve). What are the causes? This condition may be caused by:  Repeated wrist motions.  Wrist injuries.  Arthritis.  A cyst or tumor in the carpal tunnel.  Fluid buildup during pregnancy. Sometimes the cause of this condition is not known. What increases the risk? The following factors may make you more likely to develop this condition:  Having a job, such as being a Research scientist (life sciences), that requires you to repeatedly move your wrist in the same motion.  Being a woman.  Having certain conditions, such as: ? Diabetes. ? Obesity. ? An underactive thyroid (hypothyroidism). ? Kidney failure. What are the signs or symptoms? Symptoms of this condition include:  A tingling feeling in your fingers, especially in your thumb, index, and middle fingers.  Tingling or numbness in your hand.  An aching feeling in your entire arm, especially when your wrist and elbow are bent for a long time.  Wrist pain that goes up your arm to your shoulder.  Pain  that goes down into your palm or fingers.  A weak feeling in your hands. You may have trouble grabbing and holding items. Your symptoms may feel worse during the night. How is this diagnosed? This condition is diagnosed with a medical history and physical exam. You may also have tests, including:  Electromyogram (EMG). This test measures electrical signals sent by your nerves into the muscles.  Nerve conduction study. This test measures how well electrical signals pass through your nerves.  Imaging tests, such as X-rays, ultrasound, and MRI. These tests check for possible causes of your condition. How is this treated? This condition may be treated with:  Lifestyle changes. It is important to stop or change the activity that caused your condition.  Doing exercise and activities to strengthen your muscles and bones (physical therapy).  Learning how to use your hand again after diagnosis (occupational therapy).  Medicines for pain and inflammation. This may include medicine that is injected into your wrist.  A wrist splint.  Surgery. Follow these instructions at home: If you have a splint:  Wear the splint as told by your health care provider. Remove it only as told by your health care provider.  Loosen the splint if your fingers tingle, become numb, or turn cold and blue.  Keep the splint clean.  If the splint is not waterproof: ? Do not let it get wet. ? Cover it with a watertight  covering when you take a bath or shower. Managing pain, stiffness, and swelling   If directed, put ice on the painful area: ? If you have a removable splint, remove it as told by your health care provider. ? Put ice in a plastic bag. ? Place a towel between your skin and the bag. ? Leave the ice on for 20 minutes, 2-3 times per day. General instructions  Take over-the-counter and prescription medicines only as told by your health care provider.  Rest your wrist from any activity that may be  causing your pain. If your condition is work related, talk with your employer about changes that can be made, such as getting a wrist pad to use while typing.  Do any exercises as told by your health care provider, physical therapist, or occupational therapist.  Keep all follow-up visits as told by your health care provider. This is important. Contact a health care provider if:  You have new symptoms.  Your pain is not controlled with medicines.  Your symptoms get worse. Get help right away if:  You have severe numbness or tingling in your wrist or hand. Summary  Carpal tunnel syndrome is a condition that causes pain in your hand and arm.  It is usually caused by repeated wrist motions.  Lifestyle changes and medicines are used to treat carpal tunnel syndrome. Surgery may be recommended.  Follow your health care provider's instructions about wearing a splint, resting from activity, keeping follow-up visits, and calling for help. This information is not intended to replace advice given to you by your health care provider. Make sure you discuss any questions you have with your health care provider. Document Released: 07/16/2000 Document Revised: 11/25/2017 Document Reviewed: 11/25/2017 Elsevier Interactive Patient Education  2019 [employer].

## 2018-10-04 NOTE — Progress Notes (Addendum)
Subjective:  PCP: Kathrene Alu, MD Patient ID: MRN [id number]  Date of birth: [birth year]  CC: Hospital follow-up appointment  HPI:  Patient presents to clinic for hospital follow-up.  She was admitted on February 27 for 1 day for right arm numbness and weakness with some facial neurologic deficit as well.  Given her history of previous CVA in 2018, patient was admitted for stroke rule out.  All work-up was negative except for MRI C-spine showing severe right C5 and C6 facet arthropathy with severe right C6 and C7 neural foraminal narrowing.  She was seen by neurosurgery while inpatient who suggested outpatient follow-up for possible surgical intervention. Today, patient reports she still has numbness in her fingers, hands and lower arm.  She does have a dull aching pain in her upper arm and around her elbows.  She has been taking ibuprofen and Tylenol for pain.  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  She also reports that she has been leasing a beach house in [place] for the last year and has been doing extra housework requiring use of her right arm.  She reports, that when she is at home her children help her out more with things around the house.  Today, she denies any difficulty in balance, facial droop, changes in her speech.  Discharge follow-up recommendations included 1. Re-check TSH in 4 weeks 2. A1C elevated to 9.1 on admission, consider changing diabetic regimen  Patient is following up with Dr. Luvenia Redden on April 1st. And the above can be addressed at that time.   Neurology Recommendations 1. Outpatient Neurosurgery follow up for consideration of possible right C7 nerve root decompression.  2. B12, vitamin E and copper levels, which should be followed up by her PCP.   Dr. Orville Govern March 25th   Review of Symptoms: See HPI  Medications & Allergies: Reviewed with patient and updated in EMR as appropriate.   Social History: Social History    Social History Narrative   Widowed   Lives in [place] to move in with daughter   2 daughters - 1 in [place]; 1 in [place]   6 grandchildren    Jolanda reports that she has never smoked. She has never used smokeless tobacco. She reports that she does not drink alcohol or use drugs. Objective:  Physical Exam:  BP 120/70   Pulse 76   Temp (!) 97.5 F (36.4 C) (Oral)   Wt 142 lb (64.4 kg)   SpO2 98%   BMI 25.97 kg/m   General: appears well, NAD Cervical spine: There is no tenderness to palpation of bony elements of cervical spine.  She has some soft tissue tenderness on area just above and of cervical spine and beginning of thoracic spine centrally. Right upper extremity: Range of movement is limited with right shoulder abduction to about 35 degrees, as well as with flexion and extension. Passive = active with all motions.  Neurological: All sensation intact from upper arm to finger tips. No TTP of bony elements of right arm. Tinnels sign positive on right hand. Grip strength 3+/5 on right side, 5/5 of left side.   Pertinent Labs & Imaging:  MRI C-spine   MRI CERVICAL SPINE: 1. No fracture or malalignment. 2. Severe RIGHT C5-6 facet arthropathy no acute inflammatory changes. 3. No canal stenosis. Severe RIGHT > LEFT C6-7 neural foraminal narrowing.   Assessment & Plan:   Neural foraminal stenosis of cervical spine Found on MRI  during hospital admission.  Findings are consistent with physical exam.  Patient would not like to pursue surgical intervention at this time.  It is likely that this is a chronic issue that was exacerbated by acute injury.  At this time, physical exam shows intact sensation & motor with subjective tingling below the elbow.    ADDENDUM (10/17/18) Patient provided with referral to neurosurgery for further evaluation and management of cervical radiculopathy. Patient encouraged to try to avoid any exacerbating activities in the meantime. Pt reports  that she has had improvement in pain; however, episodes of pain still persist.   Patient provided with return precautions  Referral to neurosurgery, patient agreeable.   Carpal tunnel syndrome of right wrist Patient is right-handed and used to work as a Theme park manager on and off for 30 years.  Physical exam with positive Tinel sign. carpal tunnel can also be causing the pain in her elbow that is constant.  Explained to patient treatment options for carpal tunnel.  For now, patient would like to pursue conservative management with over-the-counter pain medications (patient has been counseled on NSAID use in the setting of diabetes), with possible splint use if she has further symptoms.  No ultrasound or neurologic studies planned at this time.   Continue to follow symptoms  Patient counseled with return precautions   Diabetes is currently managed by Dr. Buddy Duty.  Will defer all follow-up hospital issues to his office.  Patient also found to have high TSH in hospital, elevated from previous TSH about a year ago.  Placed future order for TSH to be redrawn in about a month.  She is following up with Dr. Buddy Duty and Dr. Cathie Olden within the next month.  No labs were drawn today.  Patient also requests antibiotics for dental procedure in [facility], as they would not write the prescription for her.  After further conversation, patient decided that she will go to her dentist in [place] who has previously prescribed amoxicillin before procedures.  Told patient that if she has any other issues with amoxicillin, she can call to the office.  Follow-up:  Future Appointments  Date Time Provider [place]  10/25/2018  9:20 AM Nahser, Wonda Cheng, MD [facility]   Zettie Cooley, M.D. [place]  PGY -1 10/17/2018, 10:47 AM

## 2018-10-05 ENCOUNTER — Encounter: Payer: Self-pay | Admitting: Family Medicine

## 2018-10-05 DIAGNOSIS — M9981 Other biomechanical lesions of cervical region: Secondary | ICD-10-CM

## 2018-10-05 DIAGNOSIS — G5601 Carpal tunnel syndrome, right upper limb: Secondary | ICD-10-CM | POA: Insufficient documentation

## 2018-10-05 DIAGNOSIS — M4802 Spinal stenosis, cervical region: Secondary | ICD-10-CM

## 2018-10-05 HISTORY — DX: Spinal stenosis, cervical region: M48.02

## 2018-10-05 HISTORY — DX: Carpal tunnel syndrome, right upper limb: G56.01

## 2018-10-05 NOTE — Assessment & Plan Note (Addendum)
Found on MRI during hospital admission.  Findings are consistent with physical exam.  Patient would not like to pursue surgical intervention at this time.  It is likely that this is a chronic issue that was exacerbated by acute injury.  At this time, physical exam shows intact sensation & motor with subjective tingling below the elbow.    ADDENDUM (10/17/18) Patient provided with referral to neurosurgery for further evaluation and management of cervical radiculopathy. Patient encouraged to try to avoid any exacerbating activities in the meantime. Pt reports that she has had improvement in pain; however, episodes of pain still persist.   Patient provided with return precautions  Referral to neurosurgery, patient agreeable.

## 2018-10-05 NOTE — Assessment & Plan Note (Signed)
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  Physical exam with positive Tinel sign. carpal tunnel can also be causing the pain in her elbow that is constant.  Explained to patient treatment options for carpal tunnel.  For now, patient would like to pursue conservative management with over-the-counter pain medications (patient has been counseled on NSAID use in the setting of diabetes), with possible splint use if she has further symptoms.  No ultrasound or neurologic studies planned at this time.   Continue to follow symptoms  Patient counseled with return precautions

## 2018-10-19 ENCOUNTER — Telehealth: Payer: Self-pay

## 2018-10-19 ENCOUNTER — Other Ambulatory Visit: Payer: Self-pay

## 2018-10-19 MED ORDER — ALBUTEROL SULFATE HFA 108 (90 BASE) MCG/ACT IN AERS: 2.0000 | INHALATION_SPRAY | Freq: Four times a day (QID) | RESPIRATORY_TRACT | 1 refills | Status: DC | PRN

## 2018-10-19 NOTE — Telephone Encounter (Signed)

## 2018-10-20 ENCOUNTER — Telehealth (INDEPENDENT_AMBULATORY_CARE_PROVIDER_SITE_OTHER): Payer: Medicare Other | Admitting: Family Medicine

## 2018-10-20 DIAGNOSIS — J9801 Acute bronchospasm: Secondary | ICD-10-CM | POA: Diagnosis not present

## 2018-10-20 HISTORY — DX: Acute bronchospasm: J98.01

## 2018-10-20 MED ORDER — FLUCONAZOLE 150 MG PO TABS: 150.0000 mg | ORAL_TABLET | Freq: Once | ORAL | 0 refills | Status: DC

## 2018-10-20 MED ORDER — BENZONATATE 100 MG PO CAPS: 100.0000 mg | ORAL_CAPSULE | Freq: Two times a day (BID) | ORAL | 0 refills | Status: DC | PRN

## 2018-10-20 MED ORDER — PREDNISONE 20 MG PO TABS: 20.0000 mg | ORAL_TABLET | Freq: Every day | ORAL | 0 refills | Status: DC

## 2018-10-20 MED ORDER — AZITHROMYCIN 250 MG PO TABS: ORAL_TABLET | ORAL | 0 refills | Status: DC

## 2018-10-20 NOTE — Assessment & Plan Note (Signed)
Patient with history of recurrent bronchospastic attacks requiring antibiotics and steroids.  Will treat with these today.  Cautioned to call us if starts having any shortness of breath

## 2018-10-20 NOTE — Telephone Encounter (Signed)
[place] Telemedicine Visit  Patient consented to have visit conducted via telephone.  Encounter participants: Patient: Wendy Mcclain  Provider: Lind Covert  Others (if applicable): no  Chief Complaint: Cough  HPI:  For the last 2 days having worsening dry cough associated with chest feeling slightly tight.  No shortness of breath or fever or leg swelling. Tried otc cough medication and using her albuterol which helps some. Has had this many times before and usually requires antibiotics and prednisone. Last was in november  ROS: see above  Pertinent PMHx: reports diabetes that is well controlled  Her recent compressed nerve is improving   Assessment/Plan:  Bronchospasm, acute Patient with history of recurrent bronchospastic attacks requiring antibiotics and steroids.  Will treat with these today.  Cautioned to call us if starts having any shortness of breath     Time spent on phone with patient: 16 minutes

## 2018-10-23 ENCOUNTER — Telehealth (INDEPENDENT_AMBULATORY_CARE_PROVIDER_SITE_OTHER): Payer: Medicare Other | Admitting: Family Medicine

## 2018-10-23 DIAGNOSIS — J4540 Moderate persistent asthma, uncomplicated: Secondary | ICD-10-CM | POA: Diagnosis not present

## 2018-10-23 DIAGNOSIS — J302 Other seasonal allergic rhinitis: Secondary | ICD-10-CM

## 2018-10-23 DIAGNOSIS — J45909 Unspecified asthma, uncomplicated: Secondary | ICD-10-CM | POA: Insufficient documentation

## 2018-10-23 HISTORY — DX: Unspecified asthma, uncomplicated: J45.909

## 2018-10-23 MED ORDER — SALINE SPRAY 0.65 % NA SOLN: 1.0000 | NASAL | 0 refills | Status: DC | PRN

## 2018-10-23 MED ORDER — FLUTICASONE PROPIONATE HFA 110 MCG/ACT IN AERO: 2.0000 | INHALATION_SPRAY | Freq: Two times a day (BID) | RESPIRATORY_TRACT | 12 refills | Status: DC

## 2018-10-23 MED ORDER — CETIRIZINE HCL 10 MG PO TABS: 10.0000 mg | ORAL_TABLET | Freq: Every day | ORAL | 1 refills | Status: DC

## 2018-10-23 NOTE — Telephone Encounter (Signed)
Agree with below.   Carina Brown, MD  Family Medicine Teaching Service   

## 2018-10-23 NOTE — Assessment & Plan Note (Signed)
Patient with long-term asthma and long-term albuterol.  Has been using every 6 hours with intermittent relief of her wheezing.  She has not had any significant attacks or feeling that she cannot do her daily activities.  She is not having any fevers, she does have significant rhinorrhea but no sinus pain at this time.  Nonproductive cough  We discussed and demonstrated controller medication in an emergency albuterol.  We will add Flovent to her current regimen.  She is also not been taking her cetirizine we have asked her to start and send in a referral for that.  Also giving her some nasal saline spray for symptomatic rhinorrhea.  Return precautions to the emergency department been discussed along with risks of going to the emergency department for minor symptoms.

## 2018-10-23 NOTE — Telephone Encounter (Signed)
[facility] Telemedicine Visit  Patient consented to have visit conducted via telephone.  Encounter participants: Patient: Wendy Mcclain  Provider: Sherene Sires   Chief Complaint: Nasal congestion, subacute asthma exacerbation  HPI: Patient called in because she has been dealing on outpatient basis with a subacute asthma exacerbation for the last few weeks.  She was recently given a course of steroids and antibiotics over the phone.  She said that she has had some mild improvement but still has nasal congestion.  She has been using her albuterol every 6 hours because she thought that it was supposed to be scheduled but she says it does actually help with her mild wheezing symptoms.  She is still speaking in full sentences and is not in respiratory distress, she is still able to walk around and do her daily activities.  She is not describe any fever symptoms she said that the rhinorrhea in her nose is gone from then to a little more thick lately but has not developed any signs of pressure pain.  ROS: No change in appetite, no dizziness or lightheadedness, no muscle aches, no fever, no vomiting or diarrhea, minor wheezing but no change in activity tolerance.  Pertinent PMHx: Longtime history of asthma, has had hospitalizations for this no known exposure to coronavirus  Assessment/Plan:  No problem-specific Assessment & Plan notes found for this encounter.    Time spent on phone with patient: 15 minutes

## 2018-10-23 NOTE — Assessment & Plan Note (Signed)
No current sinus pressure or pain, says that the rhinorrhea has went from thin to thick lately she is wondering if this is going to be a problem.  She has not been taking her prescribed cetirizine for quite some time a renewal has been sent and she will restart.  We have also given some nasal saline which she said she will try.

## 2018-10-25 ENCOUNTER — Ambulatory Visit: Payer: Medicare Other | Admitting: Cardiovascular Disease

## 2018-10-25 ENCOUNTER — Telehealth: Payer: Self-pay | Admitting: Family Medicine

## 2018-10-25 DIAGNOSIS — B9789 Other viral agents as the cause of diseases classified elsewhere: Secondary | ICD-10-CM

## 2018-10-25 DIAGNOSIS — J4540 Moderate persistent asthma, uncomplicated: Secondary | ICD-10-CM

## 2018-10-25 DIAGNOSIS — J069 Acute upper respiratory infection, unspecified: Secondary | ICD-10-CM

## 2018-10-25 NOTE — Telephone Encounter (Signed)
[facility] Telemedicine Visit  Patient consented to have visit conducted via telephone.  Encounter participants: Patient: Wendy Mcclain  Provider: Danna Hefty  Others (if applicable): None  Chief Complaint: Cough/Congestion, subacute asthma exacerbation  HPI: Had Telemedicine appointment with Dr. Erin Hearing on 3/20 for worsening dry cough and chest tightness. She was given z-pack and prednisone with scheduled use to Albuterol. Patient had another telemedicine appointment on 3/23 with Dr. Criss Rosales and was started on Flovent and restarted Cetirizine. Patient notes some improvement but does not feel completely better. Notes still experiencing wheezing, congestion, dry cough, intermittently able to cough something up, white in color. Denies any fevers or chills. Endorses mild frontal headache relieved with tylenol and some pressure around nose. Denies any SOB with daily activities, able to talk in full sentences. Patient notes she has improved a lot but still having symptoms. Patient notes using honey 1-2 times but not recently, black tea, and "cough pearls". Not using albuterol anymore since been given Flovent. Has used Flonase in the past but not recently. Denies using any decongestants. Patient wonders if she need more antibiotics to "fully get over this thing".  Patient notes she has traveled to [facility] over the last month to self isolate away from family. She is currently still there.  ROS: Denies any nausea, vomiting, abdominal pain, dizziness, lightheadedness, muscle aches, fever/chills, minor wheezing but no change in activity tolerance  Pertinent PMHx: History of recurrent bronchospastic attacks requiring antibiotics and steroids. Has been seen for similar cough/congestion/wheeze in past years treated with steroids, anitbiotics. Denies any exposure to coronavirus.  Exam: Gen: No acute distress, calmly talking on the phone. Able to speak in full sentences.  Coughing intermittently that appears dry with audible upper respiratory wheeze.   Assessment/Plan: Viral URI with cough Overall patient appears to have improved. Lingering cough and congestion s/p z-pack, prednisone. Reassured patient this is normal. Recommended OTC Flonase, Sudafed, and nasal saline to help with congestion. Recommended using honey more regularly for cough. Patient to continue Flovent BID with albuterol PRN for wheezing. Return precautions discussed at length including rebound sickness, fever/chills, worsening SOB, or any other worsening symptoms.  Asthma Transmitted upper respiratory wheezing appreciated on exam over phone, however able to speak in complete sentences.  Patient to continue Flovent BID and use Albuterol PRN for wheezing.  Return precautions discussed.    Time spent on phone with patient: 35 minutes  Mina Marble, [place], PGY1

## 2018-10-25 NOTE — Assessment & Plan Note (Signed)
Transmitted upper respiratory wheezing appreciated on exam over phone, however able to speak in complete sentences.  Patient to continue Flovent BID and use Albuterol PRN for wheezing.  Return precautions discussed.

## 2018-10-25 NOTE — Telephone Encounter (Signed)
Agree with below.   Carina Brown, MD  Family Medicine Teaching Service   

## 2018-10-25 NOTE — Assessment & Plan Note (Addendum)
Overall patient appears to have improved. Lingering cough and congestion s/p z-pack, prednisone. Reassured patient this is normal. Recommended OTC Flonase, Sudafed, and nasal saline to help with congestion. Recommended using honey more regularly for cough. Return precautions discussed at length including rebound sickness, fever/chills, worsening SOB, or any other worsening symptoms.

## 2018-10-30 ENCOUNTER — Other Ambulatory Visit: Payer: Self-pay

## 2018-10-30 ENCOUNTER — Telehealth (INDEPENDENT_AMBULATORY_CARE_PROVIDER_SITE_OTHER): Payer: Medicare Other | Admitting: Family Medicine

## 2018-10-30 DIAGNOSIS — J189 Pneumonia, unspecified organism: Secondary | ICD-10-CM | POA: Insufficient documentation

## 2018-10-30 DIAGNOSIS — J4540 Moderate persistent asthma, uncomplicated: Secondary | ICD-10-CM

## 2018-10-30 HISTORY — DX: Pneumonia, unspecified organism: J18.9

## 2018-10-30 MED ORDER — AMOXICILLIN-POT CLAVULANATE 875-125 MG PO TABS: 1.0000 | ORAL_TABLET | Freq: Two times a day (BID) | ORAL | 0 refills | Status: AC

## 2018-10-30 NOTE — Progress Notes (Signed)
[facility] Telemedicine Visit  Patient consented to have visit conducted via telephone.  Encounter participants: Patient: Wendy Mcclain  Provider: Cleophas Dunker  Others (if applicable): Dr. Erin Hearing  Chief Complaint: cough and subjective fevers  HPI: Patient calls in today with 11 days of cough, wheezing, increased sputum production.  She has had previous telemedicine encounters with providers in the last 11 days.  During her first visit she was given a prescription for Z-Pak and prednisone, and told to schedule albuterol.  Patient states that she finished these last week.  On 3/23 patient had telemedicine appointment and was started on Flovent and restarted on cetirizine.  Patient called back again on 3/25 and was told to continue Flovent twice daily and to use albuterol PRN.  Patient also advised to use over-the-counter Flonase, Sudafed, nasal saline to help with congestion.  Patient states that she has been doing all of these things, but has not had improvement in her symptoms.  She states that she feels "30% back to normal, that I am at a standstill."  She also reports new subjective fevers and chills.  She states that she has some pain under her right arm which she describes as "lung pain."  She has been at [facility] by herself for the last 10 days.  Prior to that she was in contact with her grandson who was sick with a sinus infection, but does not have any known coronavirus exposure.  She states that in the last few days she has decreased her Flovent use to every day and albuterol use to once every day.  She states that she did not do this because she was feeling better, but because she felt like she needed to wean herself off to see if she could get better.  ROS: Per HPI  Pertinent PMHx: Allergic rhinitis, acute bronchospasm, anxiety, diabetes, asthma  Physical exam: Patient able to speak in complete sentences and does not appear to be in respiratory  distress over the phone.  Able to hear deep cough which sounds productive over the phone.  Assessment/Plan:  Community acquired pneumonia Patient likely has underlying community-acquired pneumonia given lack of improvement and subjective fevers.  She has already received adequate atypical coverage, therefore will treat with Augmentin only.  Patient also advised to increase Flovent back to twice daily and albuterol scheduled again.  She was advised to do this for at least the next 4 days.  Return precautions discussed with the patient, she voiced understanding.  Discussed this case with Dr. Erin Hearing.  No emergency intervention needed at this time as patient is able to speak in complete sentences and sounds comfortable on the phone. -Augmentin twice daily x7 days -Flovent twice daily -Albuterol 2 puffs every 4 hours -Return precautions given, patient voiced understanding    Time spent on phone with patient: 13 minutes

## 2018-10-30 NOTE — Assessment & Plan Note (Signed)
Patient likely has underlying community-acquired pneumonia given lack of improvement and subjective fevers.  She has already received adequate atypical coverage, therefore will treat with Augmentin only.  Patient also advised to increase Flovent back to twice daily and albuterol scheduled again.  She was advised to do this for at least the next 4 days.  Return precautions discussed with the patient, she voiced understanding.  Discussed this case with Dr. Erin Hearing.  No emergency intervention needed at this time as patient is able to speak in complete sentences and sounds comfortable on the phone. -Augmentin twice daily x7 days -Flovent twice daily -Albuterol 2 puffs every 4 hours -Return precautions given, patient voiced understanding

## 2018-11-20 ENCOUNTER — Other Ambulatory Visit: Payer: Self-pay

## 2018-11-20 MED ORDER — ATORVASTATIN CALCIUM 40 MG PO TABS: 40.0000 mg | ORAL_TABLET | Freq: Every day | ORAL | 3 refills | Status: DC

## 2018-12-18 ENCOUNTER — Other Ambulatory Visit: Payer: Self-pay | Admitting: Family Medicine

## 2018-12-29 DIAGNOSIS — Z03818 Encounter for observation for suspected exposure to other biological agents ruled out: Secondary | ICD-10-CM | POA: Diagnosis not present

## 2019-01-12 DIAGNOSIS — I6930 Unspecified sequelae of cerebral infarction: Secondary | ICD-10-CM | POA: Diagnosis not present

## 2019-01-12 DIAGNOSIS — E1165 Type 2 diabetes mellitus with hyperglycemia: Secondary | ICD-10-CM | POA: Diagnosis not present

## 2019-01-12 DIAGNOSIS — M8580 Other specified disorders of bone density and structure, unspecified site: Secondary | ICD-10-CM | POA: Diagnosis not present

## 2019-01-12 DIAGNOSIS — Z794 Long term (current) use of insulin: Secondary | ICD-10-CM | POA: Diagnosis not present

## 2019-01-12 DIAGNOSIS — E1142 Type 2 diabetes mellitus with diabetic polyneuropathy: Secondary | ICD-10-CM | POA: Diagnosis not present

## 2019-01-12 DIAGNOSIS — E039 Hypothyroidism, unspecified: Secondary | ICD-10-CM | POA: Diagnosis not present

## 2019-02-12 ENCOUNTER — Other Ambulatory Visit: Payer: Self-pay | Admitting: Family Medicine

## 2019-02-12 DIAGNOSIS — J302 Other seasonal allergic rhinitis: Secondary | ICD-10-CM

## 2019-02-12 NOTE — Telephone Encounter (Signed)
Try to refill again, I put in [place], [place] and Zip Code. Ottis Stain, CMA

## 2019-02-12 NOTE — Telephone Encounter (Signed)
It looks like I can't e-prescribe Ms. Matt two medications because they need to have her demographic information on file.  I looked on the prescription but don't see where to add this.  Would you know how to do this?

## 2019-03-05 ENCOUNTER — Ambulatory Visit: Payer: Medicare Other | Admitting: Cardiovascular Disease

## 2019-04-05 ENCOUNTER — Encounter: Payer: Self-pay | Admitting: Family Medicine

## 2019-04-05 ENCOUNTER — Ambulatory Visit (INDEPENDENT_AMBULATORY_CARE_PROVIDER_SITE_OTHER): Payer: Medicare Other | Admitting: Family Medicine

## 2019-04-05 ENCOUNTER — Other Ambulatory Visit: Payer: Self-pay

## 2019-04-05 VITALS — BP 132/76 | HR 84

## 2019-04-05 DIAGNOSIS — IMO0001 Reserved for inherently not codable concepts without codable children: Secondary | ICD-10-CM

## 2019-04-05 DIAGNOSIS — Z0000 Encounter for general adult medical examination without abnormal findings: Secondary | ICD-10-CM

## 2019-04-05 DIAGNOSIS — S93401A Sprain of unspecified ligament of right ankle, initial encounter: Secondary | ICD-10-CM

## 2019-04-05 DIAGNOSIS — E119 Type 2 diabetes mellitus without complications: Secondary | ICD-10-CM

## 2019-04-05 LAB — POCT GLYCOSYLATED HEMOGLOBIN (HGB A1C): HbA1c, POC (controlled diabetic range): 9 % — AB (ref 0.0–7.0)

## 2019-04-05 NOTE — Progress Notes (Signed)
Subjective:    Patient ID: Wendy Mcclain, female    DOB: [birth year], 68 y.o.   MRN: [id number]   CC: foot is blue/swollen   HPI: [name] is a 68 year old female presenting to discuss the following:   Foot Bruised/swollen: Endorses onset on Sunday, August 30.  She initially noted because the side of her right foot was hurting, when she looked at her foot she noticed that it was bruised and slightly swollen on the lateral side.  Prior to this for the whole weekend, [place] to [place].  During this time she was walking more than usual and was going up and down stairs numerous times.  She denies remembering any specific injury or trauma, not sure if maybe she rolled her foot at one point.  She had been icing it and elevating it when she can, has noticed significant improvement to today.  She presents today just to have an additional evaluation because she was concerned that maybe was a blood clot.  Does not have a previous history of DVT, however does have a previous stroke.  She has been able to walk on it normally since noting foot changes/pain.  Denies any leg/calf swelling, calf pains, difficulty breathing, fever, rash, bruising elsewhere.   Additionally noted within her chart that she was supposed to to have a repeat TSH after hospital discharge back in 09/2018, however this was never completed.   Review of Systems Per HPI   Patient Active Problem List   Diagnosis Date Noted  . Grade 1 ankle sprain, right, initial encounter 04/07/2019  . Community acquired pneumonia 10/30/2018  . Asthma 10/23/2018  . Bronchospasm, acute 10/20/2018  . Neural foraminal stenosis of cervical spine 10/05/2018  . Carpal tunnel syndrome of right wrist 10/05/2018  . Right arm weakness 09/29/2018  . Paresthesia   . Arm weakness 09/28/2018  . Anxiety state 06/19/2018  . Tension headache 04/20/2017  . Baker's cyst of knee, right 04/20/2017  . Grief  reaction 04/20/2017  . Viral URI with cough 01/12/2017  . Cerebrovascular accident (CVA) ([place])   . Hyperlipidemia   . OSA (obstructive sleep apnea)   . Orthostatic hypotension 09/18/2015  . Gastro-esophageal reflux 03/20/2014  . Allergic rhinitis 07/09/2013  . Sleep disorder 07/09/2013  . Elevated blood pressure (not hypertension) 07/09/2013  . Mood disorder ([place]) 11/23/2011  . Diabetes mellitus ([place]) 11/23/2011  . Health care maintenance 11/23/2011     Objective:  BP 132/76   Pulse 84   SpO2 98%  Vitals and nursing note reviewed  General: NAD, pleasant Cardiac: RRR Respiratory: Unlabored breathing Extremities/MSK: No deformity/bony abnormalities noted in bilateral lower extremity.  Left foot normal.  In evaluating right foot, noticed mild ecchymoses semicircular shape underneath the lateral malleolus with mild swelling around this area.  No tenderness to palpation of lateral/medial malleolus, distal portion of fifth metatarsal, or navicular area.  Does endorse mild tenderness to palpation over ecchymoses and some just on the dorsal/anterior lateral aspect of foot.  No LE edema bilaterally.  Gait normal.  5/5 muscle strength in bilateral lower extremity.  Range of motion intact.  Minimal laxity noted talar tilt test on the right in comparison to left. Skin: warm and dry, no rashes noted Neuro: alert and oriented, no focal deficits Psych: normal affect  Assessment & Plan:   Grade 1 ankle sprain, right, initial encounter Mild ecchymosis and swelling underneath lateral malleolus on right  foot, significant improvement in comparison to photo shown of initial changes on 8/30.  Suspect grade 1 ankle sprain, likely anterior talofibular ligament, from rolling her ankle at some point during moving. No indication for ankle/foot imaging per [facility].  Discussed and provided reassurance with patient given her heightened concern that her clinical presentation is doubtfully secondary to DVT. -  Supportive care, rest/elevate/ice - Tylenol as needed for discomfort - Ankle compression brace when active - Discussed and provided ankle stability exercises when healed in the next several weeks - Follow-up if not improving or sooner if worsening  Health care maintenance Due for hepatitis C screening, obtained today while getting TSH.  Diabetes mellitus (HCC) A1c 9.0, previously 9.1 several months ago.  Endorses compliance with her diabetic regimen and follows with endocrinology for management. - Recommended follow-up with her endocrinologist for optimal control, especially given history of CVA   Lastly, obtained TSH today given previous abnormal in 09/2018 and provided patient with neurosurgery office for her to call and schedule an appointment for further discussion of possible right C7 nerve root decompression (see discharge summary in 09/2018).  Previous referred at that time, however with COVID pandemic had not been able to get into the office and now forgot which one she was referred to.  Follow-up if not improving or sooner if worsening.  [facility] Resident PGY-2

## 2019-04-05 NOTE — Patient Instructions (Addendum)
Neurosurgery: [facility] --please call to schedule your appointment.  It is likely that you have sprained a ligament on the side of your foot.  I recommend you rest as you can, ice 20 minutes 2 time a few times a day, and can use Tylenol/ibuprofen as needed.  Additionally make sure you elevate your foot when you are resting.  Lastly, to help with the stability of your ankle during this time, I recommend you get a compression ankle sleeve you can find at your neighborhood [facility].  Please follow-up if it is not improving in the next several weeks, or sooner if worsening.  After it is healing, I would recommend doing exercises to help with ankle stability, as you can be an increased risk for re-spraining your ankle within the next several months.  Make sure you also follow-up with your endocrinologist for your diabetes.

## 2019-04-06 LAB — TSH: TSH: 1.44 u[IU]/mL (ref 0.450–4.500)

## 2019-04-06 LAB — HEPATITIS C ANTIBODY: Hep C Virus Ab: <0.1 s/co ratio (ref 0.0–0.9)

## 2019-04-07 ENCOUNTER — Encounter: Payer: Self-pay | Admitting: Family Medicine

## 2019-04-07 DIAGNOSIS — S93401A Sprain of unspecified ligament of right ankle, initial encounter: Secondary | ICD-10-CM

## 2019-04-07 DIAGNOSIS — IMO0001 Reserved for inherently not codable concepts without codable children: Secondary | ICD-10-CM

## 2019-04-07 HISTORY — DX: Sprain of unspecified ligament of right ankle, initial encounter: S93.401A

## 2019-04-07 HISTORY — DX: Reserved for inherently not codable concepts without codable children: IMO0001

## 2019-04-07 NOTE — Assessment & Plan Note (Signed)
Mild ecchymosis and swelling underneath lateral malleolus on right foot, significant improvement in comparison to photo shown of initial changes on 8/30.  Suspect grade 1 ankle sprain, likely anterior talofibular ligament, from rolling her ankle at some point during moving. No indication for ankle/foot imaging per [facility].  Discussed and provided reassurance with patient given her heightened concern that her clinical presentation is doubtfully secondary to DVT. - Supportive care, rest/elevate/ice - Tylenol as needed for discomfort - Ankle compression brace when active - Discussed and provided ankle stability exercises when healed in the next several weeks - Follow-up if not improving or sooner if worsening

## 2019-04-07 NOTE — Assessment & Plan Note (Signed)
Due for hepatitis C screening, obtained today while getting TSH.

## 2019-04-07 NOTE — Assessment & Plan Note (Signed)
A1c 9.0, previously 9.1 several months ago.  Endorses compliance with her diabetic regimen and follows with endocrinology for management. - Recommended follow-up with her endocrinologist for optimal control, especially given history of CVA

## 2019-04-23 ENCOUNTER — Telehealth: Payer: Self-pay | Admitting: *Deleted

## 2019-04-23 NOTE — Telephone Encounter (Signed)
Pt has an appt with [facility] in [place] on Friday.  She will be having a crown and filling placed.  Due to her "heart condition" she normally gets a script of amoxicillin before a procedure.  Please send to [facility] [address]   Christen Bame, CMA

## 2019-04-24 ENCOUNTER — Other Ambulatory Visit: Payer: Self-pay | Admitting: Family Medicine

## 2019-04-24 MED ORDER — AMOXICILLIN 500 MG PO TABS: 2000.0000 mg | ORAL_TABLET | Freq: Once | ORAL | 0 refills | Status: AC

## 2019-04-24 NOTE — Telephone Encounter (Signed)
Pt called to check status.  Jessica Fleeger, CMA  

## 2019-04-24 NOTE — Telephone Encounter (Signed)
Returned patient's call regarding her prophylactic amoxicillin prior to her dental procedure.  I asked her what her heart condition was that necessitated this, and she said that she has been told that she has a leaky valve, and so she has always taken amoxicillin prior to dental procedures.  I told her that, according to her echo in 2018, she has only trivial regurgitation of 2 of her valves, and she likely does not actually need antibiotic prophylaxis for dental procedures.  However, since many dentist will not perform procedures without this if there is a cardiac history, I will go ahead and send amoxicillin 2 g to be taken once.  I advised her to ask her dentist if this is necessary for future procedures when she sees him or her.  She expressed understanding and agreement with this plan.

## 2019-05-07 DIAGNOSIS — H43813 Vitreous degeneration, bilateral: Secondary | ICD-10-CM | POA: Diagnosis not present

## 2019-05-07 DIAGNOSIS — E119 Type 2 diabetes mellitus without complications: Secondary | ICD-10-CM | POA: Diagnosis not present

## 2019-05-07 DIAGNOSIS — H353131 Nonexudative age-related macular degeneration, bilateral, early dry stage: Secondary | ICD-10-CM | POA: Diagnosis not present

## 2019-05-10 ENCOUNTER — Other Ambulatory Visit: Payer: Self-pay

## 2019-05-10 ENCOUNTER — Ambulatory Visit (INDEPENDENT_AMBULATORY_CARE_PROVIDER_SITE_OTHER): Payer: Medicare Other | Admitting: Cardiovascular Disease

## 2019-05-10 ENCOUNTER — Encounter: Payer: Self-pay | Admitting: Cardiovascular Disease

## 2019-05-10 VITALS — BP 118/78 | HR 75 | Ht 63.0 in | Wt 150.8 lb

## 2019-05-10 DIAGNOSIS — I951 Orthostatic hypotension: Secondary | ICD-10-CM

## 2019-05-10 DIAGNOSIS — M8580 Other specified disorders of bone density and structure, unspecified site: Secondary | ICD-10-CM | POA: Diagnosis not present

## 2019-05-10 DIAGNOSIS — I6930 Unspecified sequelae of cerebral infarction: Secondary | ICD-10-CM | POA: Diagnosis not present

## 2019-05-10 DIAGNOSIS — E1142 Type 2 diabetes mellitus with diabetic polyneuropathy: Secondary | ICD-10-CM | POA: Diagnosis not present

## 2019-05-10 DIAGNOSIS — E1165 Type 2 diabetes mellitus with hyperglycemia: Secondary | ICD-10-CM | POA: Diagnosis not present

## 2019-05-10 DIAGNOSIS — E039 Hypothyroidism, unspecified: Secondary | ICD-10-CM | POA: Diagnosis not present

## 2019-05-10 DIAGNOSIS — Z794 Long term (current) use of insulin: Secondary | ICD-10-CM | POA: Diagnosis not present

## 2019-05-10 NOTE — Patient Instructions (Signed)
Medication Instructions:  Your physician recommends that you continue on your current medications as directed. Please refer to the Current Medication list given to you today.  If you need a refill on your cardiac medications before your next appointment, please call your pharmacy.   Lab work: None Ordered    Testing/Procedures: None Ordered   Follow-Up: At [facility], you and your health needs are our priority.  As part of our continuing mission to provide you with exceptional heart care, we have created designated Provider Care Teams.  These Care Teams include your primary Cardiologist (physician) and Advanced Practice Providers (APPs -  Physician Assistants and Nurse Practitioners) who all work together to provide you with the care you need, when you need it. You will need a follow up appointment in:  1 years.  Please call our office 2 months in advance to schedule this appointment.  You may see Dr. Nahser or one of the following Advanced Practice Providers on your designated Care Team: Scott Weaver, PA-C Vin Bhagat, PA-C . Janine Hammond, NP    

## 2019-05-10 NOTE — Progress Notes (Signed)
Cardiology Office Note   Date:  05/10/2019   ID:  Wendy Mcclain, DOB Dec 14, 1950, MRN [id number]  PCP:  Kathrene Alu, MD  Cardiologist:   Mertie Moores, MD   No chief complaint on file.  Problem List 1. Heart murmur - trivial MR  2. Hx of syncope 3. Diabetes Mellitus  4.  CVA      Wendy Mcclain is a 68 y.o. female who presents for heart murmur. Has a remote hx of syncope.   Has seen Dr. Caryl Comes and had a + tilt table test.  Has occasional CP and occasional lightheadedness  Has occasional CP - not necessarily associated with exertion .   No real exercise.  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  Eats a farily good diet.    May not drink enough water   Non smoker    Oct. 8, 2020   Wendy Mcclain is seen today for a follow-up visit.  I saw her several years ago for episodes of presyncope. Has occasional heart palpitations The palpitations last for several minutes   - not associated with cp, dyspnea or pre syncope [place] been at the beach for the past 6 months  Has gained some weight   She has trivial mitral regurgitation by echocardiogram in 2018.  She has about needing to take antibiotics before having dental work.  She does NOT need SBE prophylaxis prior to dental work.  Past Medical History:  Diagnosis Date  . Acute sinusitis 08/19/2014  . Allergic rhinitis 07/09/2013  . Anxiety state 06/19/2018  . Arm weakness 09/28/2018  . Arrhythmia   . Asthma   . Asthma 10/23/2018  . Baker's cyst of knee, right 04/20/2017  . Bronchospasm, acute 10/20/2018  . Carpal tunnel syndrome of right wrist 10/05/2018  . Cerebrovascular accident (CVA) ([place])    09/2016: right caudate lacunar infarct secondary to small vessel disease source  . Community acquired pneumonia 10/30/2018  . Diabetes mellitus   . Diabetes mellitus ([place]) 11/23/2011   Sees Dr Buddy Duty, endocrinology  . Elevated blood pressure (not hypertension) 07/09/2013  . Gastro-esophageal reflux 03/20/2014  . GERD  (gastroesophageal reflux disease)   . Grade 1 ankle sprain, right, initial encounter 04/07/2019  . Grief reaction 04/20/2017  . Herpes labialis 03/20/2014  . Hyperlipidemia   . Left leg weakness   . Left-sided weakness 10/11/2016  . Mood disorder ([place]) 11/23/2011  . Neural foraminal stenosis of cervical spine 10/05/2018   Right Side   . Orthostatic hypotension 09/18/2015  . OSA (obstructive sleep apnea)   . Paresthesia   . Right arm weakness 09/29/2018  . Sleep disorder 07/09/2013  . Stroke ([place])   . Tension headache 04/20/2017  . Viral URI with cough 01/12/2017  . Vitamin D deficiency 05/20/2014    Past Surgical History:  Procedure Laterality Date  . ABDOMINAL HYSTERECTOMY  1993  . APPENDECTOMY  1963  . BREAST ENHANCEMENT SURGERY  1986     Current Outpatient Medications  Medication Sig Dispense Refill  . acetaminophen (TYLENOL) 500 MG tablet Take 500-1,000 mg by mouth every 6 (six) hours as needed (for headaches).     Marland Kitchen albuterol (PROVENTIL HFA;VENTOLIN HFA) 108 (90 Base) MCG/ACT inhaler Inhale 2 puffs into the lungs every 6 (six) hours as needed for wheezing or shortness of breath. 3 Inhaler 1  . aspirin 325 MG EC tablet Take 325 mg by mouth daily.    Marland Kitchen atorvastatin (LIPITOR) 40 MG  tablet Take 1 tablet (40 mg total) by mouth daily at 6 PM. 90 tablet 3  . cetirizine (ZYRTEC) 10 MG tablet TAKE 1 TABLET(10 MG) BY MOUTH DAILY 30 tablet 1  . Cholecalciferol (VITAMIN D-3) 25 MCG (1000 UT) CAPS Take 1,000 Units by mouth daily.     Marland Kitchen HUMALOG KWIKPEN 100 UNIT/ML KiwkPen Inject 9 Units into the skin 2 (two) times daily before a meal.   0  . Insulin Degludec (TRESIBA FLEXTOUCH) 100 UNIT/ML SOPN Inject 24 Units into the skin at bedtime.     . Multiple Vitamin (MULTIVITAMIN) tablet Take 1 tablet by mouth 2 (two) times a week.     Marland Kitchen omeprazole (PRILOSEC) 20 MG capsule Take 20 mg by mouth 3 (three) times a week.    . sodium chloride (OCEAN) 0.65 % SOLN nasal spray Place 1 spray into both nostrils as  needed for congestion. 1 Bottle 0   No current facility-administered medications for this visit.     Allergies:   Metformin and related    Social History:  The patient  reports that she has never smoked. She has never used smokeless tobacco. She reports that she does not drink alcohol or use drugs.   Family History:  The patient's family history includes Alzheimer's disease in her mother; Aortic aneurysm in her paternal aunt; Cancer in her father, paternal aunt, paternal grandfather, and sister; Diabetes in an other family member; Heart disease in an other family member; Hyperlipidemia in her daughter and another family member; Hypertension in an other family member; Stroke in an other family member; Thyroid disease in her daughter and sister.    ROS:  Please see the history of present illness.   Physical Exam: Blood pressure 118/78, pulse 75, height 5\' 3"  (1.6 m), weight 150 lb 12.8 oz (68.4 kg), SpO2 98 %.  GEN:  Well nourished, well developed in no acute distress HEENT: Normal NECK: No JVD; No carotid bruits LYMPHATICS: No lymphadenopathy CARDIAC: RRR , no murmurs, rubs, gallops RESPIRATORY:  Clear to auscultation without rales, wheezing or rhonchi  ABDOMEN: Soft, non-tender, non-distended MUSCULOSKELETAL:  No edema; No deformity  SKIN: Warm and dry NEUROLOGIC:  Alert and oriented x 3   EKG:     Recent Labs: 09/28/2018: ALT 33 09/29/2018: BUN 8; Creatinine, Ser 0.83; Hemoglobin 13.2; Platelets 228; Potassium 3.2; Sodium 138 04/05/2019: TSH 1.440    Lipid Panel    Component Value Date/Time   CHOL 137 09/29/2018 0229   TRIG 59 09/29/2018 0229   HDL 59 09/29/2018 0229   CHOLHDL 2.3 09/29/2018 0229   VLDL 12 09/29/2018 0229   LDLCALC 66 09/29/2018 0229      Wt Readings from Last 3 Encounters:  05/10/19 150 lb 12.8 oz (68.4 kg)  10/04/18 142 lb (64.4 kg)  09/28/18 142 lb (64.4 kg)      Other studies Reviewed: Additional studies/ records that were reviewed today  include: . Review of the above records demonstrates:    ASSESSMENT AND PLAN:  1.  Orthostatic hypotension:    She is eating and drinking better.  She has not had any recent episodes of orthostatic hypotension.  2. Heart murmur: -  She has trivial mitral regurgitation by echocardiogram in 2018.  She has about needing to take antibiotics before having dental work.  She does NOT need SBE prophylaxis prior to dental work.  3.  Palpitations: She has occasional/rare episodes of palpitations.  These last for several minutes.  They do not occur very frequently  and at this point I do not think that sending her home with a monitor would be productive.  We discussed being able to record these on an apple watch or a Kardia monitor   4.  CVA :  Cont ASA 325 mg as recommended by neuro .  She will ask neuro if she needs to continue the full strength ASA   Current medicines are reviewed at length with the patient today.  The patient does not have concerns regarding medicines.  The following changes have been made:  no change  Labs/ tests ordered today include:  No orders of the defined types were placed in this encounter.    Disposition:   FU with me in 1 year      Mertie Moores, MD  05/10/2019 11:45 AM    [address], [place], [place]  [address] Phone: [phone]; Fax: [phone]

## 2019-06-07 ENCOUNTER — Other Ambulatory Visit: Payer: Self-pay | Admitting: Family Medicine

## 2019-06-07 ENCOUNTER — Other Ambulatory Visit: Payer: Self-pay

## 2019-06-07 ENCOUNTER — Telehealth: Payer: Self-pay | Admitting: Family Medicine

## 2019-06-07 DIAGNOSIS — J302 Other seasonal allergic rhinitis: Secondary | ICD-10-CM

## 2019-06-07 MED ORDER — ATORVASTATIN CALCIUM 40 MG PO TABS: 40.0000 mg | ORAL_TABLET | Freq: Every day | ORAL | 3 refills | Status: DC

## 2019-06-07 MED ORDER — CETIRIZINE HCL 10 MG PO TABS: ORAL_TABLET | ORAL | 1 refills | Status: DC

## 2019-06-07 MED ORDER — BUSPIRONE HCL 7.5 MG PO TABS: ORAL_TABLET | ORAL | 0 refills | Status: DC

## 2019-06-07 MED ORDER — FLUTICASONE PROPIONATE 50 MCG/ACT NA SUSP: 2.0000 | Freq: Every day | NASAL | 6 refills | Status: DC

## 2019-06-07 MED ORDER — OMEPRAZOLE 20 MG PO CPDR: 20.0000 mg | DELAYED_RELEASE_CAPSULE | ORAL | 0 refills | Status: DC

## 2019-06-07 NOTE — Telephone Encounter (Signed)
I am sending in BuSpar 7.5 mg twice daily for 3 days with an increase to 15 mg twice daily thereafter.  I have sent this to the mail order pharmacy that patient requested.  I would like for Wendy Mcclain to come in to see me in the next month or 2 since she has not seen me for about 2 years, although she has seen other providers since then.  This is especially important since we are starting a new medication for her anxiety.  Thank you.

## 2019-06-07 NOTE — Telephone Encounter (Signed)
Patient calls nurse line requesting her medications to be sent to mail order. Patient stated they will deliver and cost a lot less. Patient also wants to know if you can prescribe her a low dose of anti anxiety medication. Patient stated she has discussed this with you in the past. Please advise.

## 2019-06-08 NOTE — Telephone Encounter (Signed)
Pt scheduled to see PCP 12/2. Also, pt Endo said that Dr. Shan Levans is a wonderful Dr. Ottis Stain, [place]

## 2019-06-09 NOTE — Telephone Encounter (Signed)
That is very nice of her to say.  I'll look forward to seeing her on 12/2.  Thanks!

## 2019-07-03 DIAGNOSIS — J019 Acute sinusitis, unspecified: Secondary | ICD-10-CM | POA: Diagnosis not present

## 2019-07-03 DIAGNOSIS — Z20828 Contact with and (suspected) exposure to other viral communicable diseases: Secondary | ICD-10-CM | POA: Diagnosis not present

## 2019-07-04 ENCOUNTER — Telehealth (INDEPENDENT_AMBULATORY_CARE_PROVIDER_SITE_OTHER): Payer: Medicare Other | Admitting: Family Medicine

## 2019-07-04 ENCOUNTER — Other Ambulatory Visit: Payer: Self-pay | Admitting: Family Medicine

## 2019-07-04 ENCOUNTER — Other Ambulatory Visit: Payer: Self-pay

## 2019-07-04 DIAGNOSIS — R058 Other specified cough: Secondary | ICD-10-CM | POA: Insufficient documentation

## 2019-07-04 DIAGNOSIS — Z0000 Encounter for general adult medical examination without abnormal findings: Secondary | ICD-10-CM

## 2019-07-04 DIAGNOSIS — R05 Cough: Secondary | ICD-10-CM | POA: Diagnosis present

## 2019-07-04 DIAGNOSIS — Z20828 Contact with and (suspected) exposure to other viral communicable diseases: Secondary | ICD-10-CM | POA: Diagnosis not present

## 2019-07-04 DIAGNOSIS — F411 Generalized anxiety disorder: Secondary | ICD-10-CM

## 2019-07-04 DIAGNOSIS — J302 Other seasonal allergic rhinitis: Secondary | ICD-10-CM

## 2019-07-04 DIAGNOSIS — Z20822 Contact with and (suspected) exposure to covid-19: Secondary | ICD-10-CM

## 2019-07-04 MED ORDER — OMEPRAZOLE 20 MG PO CPDR: 20.0000 mg | DELAYED_RELEASE_CAPSULE | ORAL | 0 refills | Status: DC

## 2019-07-04 MED ORDER — BUSPIRONE HCL 7.5 MG PO TABS: ORAL_TABLET | ORAL | 0 refills | Status: DC

## 2019-07-04 MED ORDER — CETIRIZINE HCL 10 MG PO TABS: ORAL_TABLET | ORAL | 3 refills | Status: DC

## 2019-07-04 MED ORDER — SERTRALINE HCL 50 MG PO TABS: 50.0000 mg | ORAL_TABLET | Freq: Every day | ORAL | 3 refills | Status: DC

## 2019-07-04 NOTE — Assessment & Plan Note (Signed)
Will start Zoloft 50 mg and BuSpar 7.5 mg once daily and will titrate up on the BuSpar to twice daily.  Would like to see her back in a month or 2 after she has recovered from her current illness.

## 2019-07-04 NOTE — Progress Notes (Signed)
[place] Telemedicine Visit  Patient consented to have virtual visit. Method of visit: Video was attempted, but technology challenges prevented patient from using video, so visit was conducted via telephone.  Encounter participants: Patient: Wendy Mcclain - located at home Provider: Kathrene Alu - located at [facility] Others (if applicable): none  Chief Complaint: Anxiety, COVID symptoms  HPI:  Anxiety Patient reports her anxiety started after her husband's death and was recently exacerbated by driving over a high bridge in [place].  She says that her anxiety will flareup when she is driving or when she is trying to sleep.  She says that she will wake up and stay awake about 1 to 2 hours in the middle the night with racing thoughts.  She is interested in starting a medication to help with her anxiety.  I had ordered some BuSpar for her recently but she never received it.  Covid symptoms Patient says that she was around her sister and nephew last week, and her nephew is now in [place].  She developed a cough, congestion, fever, and fatigue 2 days ago and is awaiting the results of her Covid test which was performed yesterday.  She is drinking hot tea and taking Tylenol and staying home by herself currently.  She denies shortness of breath.  ROS: per HPI  Pertinent PMHx: CVA, asthma, anxiety state  Exam:  Respiratory: Patient able to speak in complete sentences without shortness of breath, intermittent cough heard  Assessment/Plan:  Anxiety state Will start Zoloft 50 mg and BuSpar 7.5 mg once daily and will titrate up on the BuSpar to twice daily.  Would like to see her back in a month or 2 after she has recovered from her current illness.  Cough with exposure to COVID-19 virus Reviewed supportive measures with the patient and told her that if she develops shortness of breath, she should go to the emergency department.  Health  care maintenance Refilled patient's Prilosec and cetirizine.    Time spent during visit with patient: 12 minutes

## 2019-07-04 NOTE — Assessment & Plan Note (Signed)
Reviewed supportive measures with the patient and told her that if she develops shortness of breath, she should go to the emergency department.

## 2019-07-04 NOTE — Assessment & Plan Note (Signed)
Refilled patient's Prilosec and cetirizine.

## 2019-07-09 ENCOUNTER — Other Ambulatory Visit: Payer: Self-pay | Admitting: Family Medicine

## 2019-07-09 ENCOUNTER — Telehealth: Payer: Self-pay

## 2019-07-09 MED ORDER — GABAPENTIN 300 MG PO CAPS: 300.0000 mg | ORAL_CAPSULE | Freq: Three times a day (TID) | ORAL | 0 refills | Status: DC | PRN

## 2019-07-09 NOTE — Telephone Encounter (Signed)
Sent gabapentin to patient's pharmacy and called patient to let her know about this.  Also counseled her to use Tylenol scheduled and as needed ibuprofen up to ever 6 hours for her pain.  Patient expressed understanding of these instructions.

## 2019-07-09 NOTE — Telephone Encounter (Signed)
Patient calls nurse line stating she is Covid positive and endorses terrible body aches. Patient stated I am a diabetic and I have never really experienced neuropathy before, however it feels a lot like that. Patient rates her pain a 10/10, especially in her legs, feet and arms. Patient was almost in tears on the phone due to pain. Patient is requesting something called in for pain. Please advise.

## 2019-07-13 ENCOUNTER — Telehealth: Payer: Self-pay | Admitting: *Deleted

## 2019-07-13 ENCOUNTER — Other Ambulatory Visit: Payer: Self-pay

## 2019-07-13 ENCOUNTER — Emergency Department (HOSPITAL_COMMUNITY): Payer: Medicare Other

## 2019-07-13 ENCOUNTER — Encounter (HOSPITAL_COMMUNITY): Payer: Self-pay | Admitting: *Deleted

## 2019-07-13 ENCOUNTER — Emergency Department (HOSPITAL_COMMUNITY)
Admission: EM | Admit: 2019-07-13 | Discharge: 2019-07-13 | Disposition: A | Discharge status: Home / Self Care | Payer: Medicare Other | Attending: Emergency Medicine | Admitting: Emergency Medicine

## 2019-07-13 DIAGNOSIS — J1289 Other viral pneumonia: Secondary | ICD-10-CM | POA: Insufficient documentation

## 2019-07-13 DIAGNOSIS — E876 Hypokalemia: Secondary | ICD-10-CM | POA: Diagnosis not present

## 2019-07-13 DIAGNOSIS — Z794 Long term (current) use of insulin: Secondary | ICD-10-CM | POA: Insufficient documentation

## 2019-07-13 DIAGNOSIS — I10 Essential (primary) hypertension: Secondary | ICD-10-CM | POA: Insufficient documentation

## 2019-07-13 DIAGNOSIS — R319 Hematuria, unspecified: Secondary | ICD-10-CM | POA: Diagnosis not present

## 2019-07-13 DIAGNOSIS — J1282 Pneumonia due to coronavirus disease 2019: Secondary | ICD-10-CM

## 2019-07-13 DIAGNOSIS — R0602 Shortness of breath: Secondary | ICD-10-CM | POA: Diagnosis present

## 2019-07-13 DIAGNOSIS — Z8673 Personal history of transient ischemic attack (TIA), and cerebral infarction without residual deficits: Secondary | ICD-10-CM | POA: Diagnosis not present

## 2019-07-13 DIAGNOSIS — N390 Urinary tract infection, site not specified: Secondary | ICD-10-CM

## 2019-07-13 DIAGNOSIS — U071 COVID-19: Secondary | ICD-10-CM

## 2019-07-13 DIAGNOSIS — E119 Type 2 diabetes mellitus without complications: Secondary | ICD-10-CM | POA: Diagnosis not present

## 2019-07-13 DIAGNOSIS — Z7982 Long term (current) use of aspirin: Secondary | ICD-10-CM | POA: Insufficient documentation

## 2019-07-13 DIAGNOSIS — Z79899 Other long term (current) drug therapy: Secondary | ICD-10-CM | POA: Insufficient documentation

## 2019-07-13 DIAGNOSIS — J45909 Unspecified asthma, uncomplicated: Secondary | ICD-10-CM | POA: Diagnosis not present

## 2019-07-13 LAB — CBC WITH DIFFERENTIAL/PLATELET
Abs Immature Granulocytes: 0.01 10*3/uL (ref 0.00–0.07)
Basophils Absolute: 0 10*3/uL (ref 0.0–0.1)
Basophils Relative: 0 %
Eosinophils Absolute: 0 10*3/uL (ref 0.0–0.5)
Eosinophils Relative: 0 %
HCT: 36.2 % (ref 36.0–46.0)
Hemoglobin: 11.9 g/dL — ABNORMAL LOW (ref 12.0–15.0)
Immature Granulocytes: 0 %
Lymphocytes Relative: 19 %
Lymphs Abs: 1 10*3/uL (ref 0.7–4.0)
MCH: 30.1 pg (ref 26.0–34.0)
MCHC: 32.9 g/dL (ref 30.0–36.0)
MCV: 91.6 fL (ref 80.0–100.0)
Monocytes Absolute: 0.3 10*3/uL (ref 0.1–1.0)
Monocytes Relative: 7 %
Neutro Abs: 3.8 10*3/uL (ref 1.7–7.7)
Neutrophils Relative %: 74 %
Platelets: 179 10*3/uL (ref 150–400)
RBC: 3.95 MIL/uL (ref 3.87–5.11)
RDW: 12.9 % (ref 11.5–15.5)
WBC: 5.1 10*3/uL (ref 4.0–10.5)
nRBC: 0 % (ref 0.0–0.2)

## 2019-07-13 LAB — URINALYSIS, ROUTINE W REFLEX MICROSCOPIC
Bilirubin Urine: NEGATIVE
Glucose, UA: 50 mg/dL — AB
Ketones, ur: 20 mg/dL — AB
Nitrite: NEGATIVE
Protein, ur: 30 mg/dL — AB
RBC / HPF: >50 RBC/hpf — ABNORMAL HIGH (ref 0–5)
Specific Gravity, Urine: 1.04 — ABNORMAL HIGH (ref 1.005–1.030)
WBC, UA: >50 WBC/hpf — ABNORMAL HIGH (ref 0–5)
pH: 6 (ref 5.0–8.0)

## 2019-07-13 LAB — COMPREHENSIVE METABOLIC PANEL
ALT: 36 U/L (ref 0–44)
AST: 52 U/L — ABNORMAL HIGH (ref 15–41)
Albumin: 3.3 g/dL — ABNORMAL LOW (ref 3.5–5.0)
Alkaline Phosphatase: 113 U/L (ref 38–126)
Anion gap: 10 (ref 5–15)
BUN: 16 mg/dL (ref 8–23)
CO2: 23 mmol/L (ref 22–32)
Calcium: 8.2 mg/dL — ABNORMAL LOW (ref 8.9–10.3)
Chloride: 103 mmol/L (ref 98–111)
Creatinine, Ser: 0.73 mg/dL (ref 0.44–1.00)
GFR calc Af Amer: >60 mL/min (ref >60)
GFR calc non Af Amer: >60 mL/min (ref >60)
Glucose, Bld: 171 mg/dL — ABNORMAL HIGH (ref 70–99)
Potassium: 3 mmol/L — ABNORMAL LOW (ref 3.5–5.1)
Sodium: 136 mmol/L (ref 135–145)
Total Bilirubin: 1.3 mg/dL — ABNORMAL HIGH (ref 0.3–1.2)
Total Protein: 7.5 g/dL (ref 6.5–8.1)

## 2019-07-13 LAB — TROPONIN I (HIGH SENSITIVITY): Troponin I (High Sensitivity): 4 ng/L (ref <18)

## 2019-07-13 LAB — D-DIMER, QUANTITATIVE: D-Dimer, Quant: 1.17 ug/mL-FEU — ABNORMAL HIGH (ref 0.00–0.50)

## 2019-07-13 LAB — LACTIC ACID, PLASMA: Lactic Acid, Venous: 1.1 mmol/L (ref 0.5–1.9)

## 2019-07-13 MED ORDER — CEPHALEXIN 500 MG PO CAPS
500.0000 mg | ORAL_CAPSULE | Freq: Once | ORAL | Status: AC
  Administered 2019-07-13: 500 mg via ORAL
  Filled 2019-07-13: qty 1

## 2019-07-13 MED ORDER — SODIUM CHLORIDE 0.9 % IV BOLUS
1000.0000 mL | Freq: Once | INTRAVENOUS | Status: AC
  Administered 2019-07-13: 12:00:00 1000 mL via INTRAVENOUS

## 2019-07-13 MED ORDER — POTASSIUM CHLORIDE ER 10 MEQ PO TBCR: 20.0000 meq | EXTENDED_RELEASE_TABLET | Freq: Every day | ORAL | 0 refills | Status: DC

## 2019-07-13 MED ORDER — POTASSIUM CHLORIDE CRYS ER 20 MEQ PO TBCR
40.0000 meq | EXTENDED_RELEASE_TABLET | Freq: Once | ORAL | Status: AC
  Administered 2019-07-13: 40 meq via ORAL
  Filled 2019-07-13: qty 2

## 2019-07-13 MED ORDER — ONDANSETRON HCL 4 MG/2ML IJ SOLN
4.0000 mg | Freq: Once | INTRAMUSCULAR | Status: AC
  Administered 2019-07-13: 4 mg via INTRAVENOUS
  Filled 2019-07-13: qty 2

## 2019-07-13 MED ORDER — DOXYCYCLINE HYCLATE 100 MG PO TABS
100.0000 mg | ORAL_TABLET | Freq: Once | ORAL | Status: AC
  Administered 2019-07-13: 19:00:00 100 mg via ORAL
  Filled 2019-07-13: qty 1

## 2019-07-13 MED ORDER — ACETAMINOPHEN 325 MG PO TABS
650.0000 mg | ORAL_TABLET | Freq: Once | ORAL | Status: AC
  Administered 2019-07-13: 12:00:00 650 mg via ORAL
  Filled 2019-07-13: qty 2

## 2019-07-13 MED ORDER — DOXYCYCLINE HYCLATE 100 MG PO CAPS: 100.0000 mg | ORAL_CAPSULE | Freq: Two times a day (BID) | ORAL | 0 refills | Status: AC

## 2019-07-13 MED ORDER — CEPHALEXIN 500 MG PO CAPS: 500.0000 mg | ORAL_CAPSULE | Freq: Two times a day (BID) | ORAL | 0 refills | Status: AC

## 2019-07-13 MED ORDER — IOHEXOL 350 MG/ML SOLN
100.0000 mL | Freq: Once | INTRAVENOUS | Status: AC | PRN
  Administered 2019-07-13: 100 mL via INTRAVENOUS

## 2019-07-13 NOTE — Telephone Encounter (Signed)
Thank you for your help with this patient.  I called Wendy Mcclain on 12/9 to check on her, and she seemed to be doing better than she was when we spoke on 12/7, although still feeling fatigued and continuing to have a cough.  At that time, she was able to stay hydrated and was urinating normally and had no shortness of breath during our conversation.  I gave her the red flags of shortness of breath and inability to take enough p.o. to have normal urination, and she denied those symptoms on 12/9.  I will continue to follow this patient closely.

## 2019-07-13 NOTE — Discharge Instructions (Addendum)
You have been diagnosed today with pneumonia due to COVID-19 virus, urinary tract infection, low potassium level.  At this time there does not appear to be the presence of an emergent medical condition, however there is always the potential for conditions to change. Please read and follow the below instructions.  Please return to the Emergency Department immediately for any new or worsening symptoms. Please be sure to follow up with your Primary Care Provider within one week regarding your visit today; please call their office to schedule an appointment even if you are feeling better for a follow-up visit. Your potassium supplement has been sent to your pharmacy, please take this as prescribed.  Discuss your low potassium level with your primary care provider and have this level rechecked next week. It appears that you have a urinary tract infection today, please take the antibiotic Keflex as prescribed for treatment.  Please drink plenty of water and get plenty of rest. It appears you have developed a pneumonia.  Please take the antibiotic doxycycline as prescribed for treatment.  Additionally as with your positive Covid test please continue self isolate to avoid spread of this virus.  Get help right away if: You have very bad back pain. You have very bad pain in your lower belly. You have a fever. You are sick to your stomach (nauseous). You are throwing up. You have trouble breathing. You have pain or pressure in your chest. You have confusion. You have bluish lips and fingernails. You have difficulty waking from sleep. You have any new/concerning or worsening of symptoms  Please read the additional information packets attached to your discharge summary.  Do not take your medicine if  develop an itchy rash, swelling in your mouth or lips, or difficulty breathing; call 911 and seek immediate emergency medical attention if this occurs.  Note: Portions of this text may have been  transcribed using voice recognition software. Every effort was made to ensure accuracy; however, inadvertent computerized transcription errors may still be present.

## 2019-07-13 NOTE — ED Provider Notes (Signed)
Care handoff received from [facility] PA-C at shift change, please see her note for full details.  In short 68 year old female history of diabetes, CVA, asthma, hypertension presents with shortness of breath, Covid positive on 07/04/2019, ninth day of illness.  CBC without leukocytosis, hemoglobin appears baseline, CMP shows hypokalemia 3.0, repletion given by previous team in addition to IV fluids, Tylenol and Zofran.  Chest x-ray shows concern for Covid pneumonia, EKG without ischemic changes, lactic acid within normal limits.  Troponin of 4, low suspicion for ACS given history.  D-dimer elevated.  CT angiogram PE study ordered by previous team and is pending.  Plan of care at shift change is to await CT angio PE study and disposition. Physical Exam  BP 132/69   Pulse 66   Temp 98 F (36.7 C) (Oral)   Resp (!) 27   SpO2 95%   Physical Exam  ED Course/Procedures     Procedures  MDM  Lactic 1.1 High-sensitivity troponin: 4 CBC nonacute D-dimer 1.17 CMP nonacute, potassium 3.0 repleted by previous team Urinalysis with leukocytes, red blood cells, white blood cells and bacteria, concerning for infection will send culture and treat with Keflex Chest x-ray:  IMPRESSION:  1. Right mid to upper lung airspace opacity peripherally, favoring  pneumonia given the clinical circumstances. This is most likely in  the right upper lobe but could be in the superior segment right  lower lobe.   CT Angio PE:  IMPRESSION:  1. No pulmonary emboli.  2. Extensive ground-glass pulmonary infiltrates in both lower lobes,  right upper lobe, and right middle lobe consistent with pneumonia.  - Patient ambulated without hypoxia, tachycardia or shortness of breath with nursing staff.  She is well-appearing and in no acute distress on my examination.  Discussed case with Dr. Kathrynn Humble, plan of care this time is to treat UTI with Keflex, additionally as there is a concern for superimposed bacterial  pneumonia related to her COVID-19 virus will cover with doxycycline.  She is to follow-up with PCP for recheck in televisit this week.  Potassium supplements sent to pharmacy by previous team.  At this time there does not appear to be any evidence of an acute emergency medical condition and the patient appears stable for discharge with appropriate outpatient follow up. Diagnosis was discussed with patient who verbalizes understanding of care plan and is agreeable to discharge. I have discussed return precautions with patient who verbalizes understanding of return precautions. Patient encouraged to follow-up with their PCP. All questions answered.  Patient has been discharged in good condition.  Patient's case discussed with Dr. Kathrynn Humble who agrees with plan to discharge with follow-up.   AZURI YA was evaluated in Emergency Department on 07/13/2019 for the symptoms described in the history of present illness. She was evaluated in the context of the global COVID-19 pandemic, which necessitated consideration that the patient might be at risk for infection with the SARS-CoV-2 virus that causes COVID-19. Institutional protocols and algorithms that pertain to the evaluation of patients at risk for COVID-19 are in a state of rapid change based on information released by regulatory bodies including the CDC and federal and state organizations. These policies and algorithms were followed during the patient's care in the ED.   Note: Portions of this report may have been transcribed using voice recognition software. Every effort was made to ensure accuracy; however, inadvertent computerized transcription errors may still be present.   Gari Crown 07/13/19 1858    Nanavati, Ankit,  MD 07/13/19 2016

## 2019-07-13 NOTE — Telephone Encounter (Signed)
Pt LM on nurse line c/o of dehydration and not feeling well.  Called back, pt is SOB and states that she does not feel well "at all" and is sure she is dehydrated.  Advised that she will need to go to [facility] or ED to received fluids, she has already called 911 to take her when I called.   To PCP as FYI. Christen Bame, CMA

## 2019-07-13 NOTE — ED Provider Notes (Signed)
[facility] DEPT Provider Note   CSN: [id number] Arrival date & time: 07/13/19  1030     History Chief Complaint  Patient presents with  . Shortness of Breath    Wendy Mcclain is a 68 y.o. female with past medical history significant for diabetes, CVA, asthma, hypertension presents to emergency department today with chief complaint of shortness of breath.  Patient tested positive for Covid on 07/04/2019 at an urgent care.  This is day 9 of illness.  She states her symptoms have progressively worsened.  She reporting generalized weakness and has had little to no p.o. intake in the last week.  She feels dehydrated and has been making less urine.  She states her blood sugars have been around 300.  She is also reporting worsening shortness of breath with exertion, chills, productive cough and nasal congestion. Cough has clear phlegm. She has associated nausea without emesis.  She had diarrhea when first diagnosed but that has resolved. She has been using her albuterol inhaler more often that usual.    She denies any fever, chest pain, wheezing, gross hematuria, urinary frequency. History provided by patient with additional history obtained from chart review.      Past Medical History:  Diagnosis Date  . Acute sinusitis 08/19/2014  . Allergic rhinitis 07/09/2013  . Anxiety state 06/19/2018  . Arm weakness 09/28/2018  . Arrhythmia   . Asthma   . Asthma 10/23/2018  . Baker's cyst of knee, right 04/20/2017  . Bronchospasm, acute 10/20/2018  . Carpal tunnel syndrome of right wrist 10/05/2018  . Cerebrovascular accident (CVA) ([place])    09/2016: right caudate lacunar infarct secondary to small vessel disease source  . Community acquired pneumonia 10/30/2018  . Diabetes mellitus   . Diabetes mellitus ([place]) 11/23/2011   Sees Dr Buddy Duty, endocrinology  . Elevated blood pressure (not hypertension) 07/09/2013  . Gastro-esophageal reflux 03/20/2014  . GERD (gastroesophageal  reflux disease)   . Grade 1 ankle sprain, right, initial encounter 04/07/2019  . Grief reaction 04/20/2017  . Herpes labialis 03/20/2014  . Hyperlipidemia   . Left leg weakness   . Left-sided weakness 10/11/2016  . Mood disorder ([place]) 11/23/2011  . Neural foraminal stenosis of cervical spine 10/05/2018   Right Side   . Orthostatic hypotension 09/18/2015  . OSA (obstructive sleep apnea)   . Paresthesia   . Right arm weakness 09/29/2018  . Sleep disorder 07/09/2013  . Stroke ([place])   . Tension headache 04/20/2017  . Viral URI with cough 01/12/2017  . Vitamin D deficiency 05/20/2014    Patient Active Problem List   Diagnosis Date Noted  . Cough with exposure to COVID-19 virus 07/04/2019  . Grade 1 ankle sprain, right, initial encounter 04/07/2019  . Community acquired pneumonia 10/30/2018  . Asthma 10/23/2018  . Bronchospasm, acute 10/20/2018  . Neural foraminal stenosis of cervical spine 10/05/2018  . Carpal tunnel syndrome of right wrist 10/05/2018  . Right arm weakness 09/29/2018  . Paresthesia   . Arm weakness 09/28/2018  . Anxiety state 06/19/2018  . Tension headache 04/20/2017  . Baker's cyst of knee, right 04/20/2017  . Grief reaction 04/20/2017  . Viral URI with cough 01/12/2017  . Cerebrovascular accident (CVA) ([place])   . Hyperlipidemia   . OSA (obstructive sleep apnea)   . Orthostatic hypotension 09/18/2015  . Gastro-esophageal reflux 03/20/2014  . Allergic rhinitis 07/09/2013  . Sleep disorder 07/09/2013  . Elevated blood pressure (not hypertension) 07/09/2013  . Mood disorder ([place]) 11/23/2011  .  Diabetes mellitus ([place]) 11/23/2011  . Health care maintenance 11/23/2011    Past Surgical History:  Procedure Laterality Date  . ABDOMINAL HYSTERECTOMY  1993  . APPENDECTOMY  1963  . BREAST ENHANCEMENT SURGERY  1986     OB History   No obstetric history on file.     Family History  Problem Relation Age of Onset  . Alzheimer's disease Mother   . Cancer Father         esophageal  . Aortic aneurysm Paternal Aunt        2 aunts died at age 3  . Cancer Sister        stomach  . Cancer Paternal Grandfather        mouth & larynx  . Cancer Paternal Aunt        colon  . Heart disease Other   . Diabetes Other   . Hyperlipidemia Other   . Hypertension Other   . Stroke Other   . Thyroid disease Sister   . Hyperlipidemia Daughter   . Thyroid disease Daughter   . Colon cancer Neg Hx     Social History   Tobacco Use  . Smoking status: Never Smoker  . Smokeless tobacco: Never Used  Substance Use Topics  . Alcohol use: No  . Drug use: No    Home Medications Prior to Admission medications   Medication Sig Start Date End Date Taking? Authorizing Provider  acetaminophen (TYLENOL) 500 MG tablet Take 500-1,000 mg by mouth every 6 (six) hours as needed (for headaches).     [provider]  albuterol (PROVENTIL HFA;VENTOLIN HFA) 108 (90 Base) MCG/ACT inhaler Inhale 2 puffs into the lungs every 6 (six) hours as needed for wheezing or shortness of breath. 10/19/18   Kathrene Alu, MD  aspirin 325 MG EC tablet Take 325 mg by mouth daily.    [provider]  atorvastatin (LIPITOR) 40 MG tablet Take 1 tablet (40 mg total) by mouth daily at 6 PM. 06/07/19 06/01/20  Winfrey, Alcario Drought, MD  busPIRone (BUSPAR) 7.5 MG tablet Take one pill twice per day for three days, then increase to two pills twice per day 07/04/19   Kathrene Alu, MD  cetirizine (ZYRTEC) 10 MG tablet TAKE 1 TABLET(10 MG) BY MOUTH DAILY 07/04/19   Kathrene Alu, MD  Cholecalciferol (VITAMIN D-3) 25 MCG (1000 UT) CAPS Take 1,000 Units by mouth daily.     [provider]  fluticasone (FLONASE) 50 MCG/ACT nasal spray Place 2 sprays into both nostrils daily. 06/07/19   Kathrene Alu, MD  gabapentin (NEURONTIN) 300 MG capsule Take 1 capsule (300 mg total) by mouth 3 (three) times daily as needed. 07/09/19   Kathrene Alu, MD  HUMALOG KWIKPEN 100 UNIT/ML KiwkPen  Inject 9 Units into the skin 2 (two) times daily before a meal.  10/19/16   [provider]  Insulin Degludec (TRESIBA FLEXTOUCH) 100 UNIT/ML SOPN Inject 24 Units into the skin at bedtime.     [provider]  Multiple Vitamin (MULTIVITAMIN) tablet Take 1 tablet by mouth 2 (two) times a week.     [provider]  omeprazole (PRILOSEC) 20 MG capsule Take 1 capsule (20 mg total) by mouth 3 (three) times a week. 07/04/19   Kathrene Alu, MD  sertraline (ZOLOFT) 50 MG tablet Take 1 tablet (50 mg total) by mouth daily. 07/04/19   Kathrene Alu, MD  sodium chloride (OCEAN) 0.65 % SOLN nasal spray Place  1 spray into both nostrils as needed for congestion. 10/23/18   Sherene Sires, DO    Allergies    Metformin and related  Review of Systems   Review of Systems  Constitutional: Positive for appetite change, chills and fatigue. Negative for diaphoresis and fever.  HENT: Positive for congestion. Negative for ear discharge, ear pain, sinus pressure, sinus pain and sore throat.   Eyes: Negative for pain and redness.  Respiratory: Positive for cough and shortness of breath. Negative for chest tightness.   Cardiovascular: Negative for chest pain.  Gastrointestinal: Positive for nausea. Negative for abdominal pain, constipation, diarrhea and vomiting.  Genitourinary: Negative for dysuria and hematuria.  Musculoskeletal: Negative for back pain and neck pain.  Skin: Negative for wound.  Neurological: Negative for weakness, numbness and headaches.    Physical Exam Updated Vital Signs BP 135/65 (BP Location: Right Arm)   Pulse 83   Temp 98 F (36.7 C) (Oral)   Resp (!) 25   SpO2 95%   Physical Exam Vitals and nursing note reviewed.  Constitutional:      General: She is not in acute distress.    Appearance: She is ill-appearing. She is not toxic-appearing.  HENT:     Head: Normocephalic and atraumatic.     Right Ear: Tympanic membrane and external ear normal.      Left Ear: Tympanic membrane and external ear normal.     Nose: Nose normal.     Mouth/Throat:     Mouth: Mucous membranes are dry.     Pharynx: Oropharynx is clear.  Eyes:     General: No scleral icterus.       Right eye: No discharge.        Left eye: No discharge.     Extraocular Movements: Extraocular movements intact.     Conjunctiva/sclera: Conjunctivae normal.     Pupils: Pupils are equal, round, and reactive to light.  Neck:     Vascular: No JVD.  Cardiovascular:     Rate and Rhythm: Normal rate and regular rhythm.     Pulses: Normal pulses.          Radial pulses are 2+ on the right side and 2+ on the left side.     Heart sounds: Normal heart sounds.  Pulmonary:     Comments: Lungs sounds diminished throughout.  She is speaking in full sentences, no accessory muscle use.  Symmetric chest rise. No wheezing, rales, or rhonchi.  SPO2 is 95% on room air during exam. Abdominal:     Comments: Abdomen is soft, non-distended, and non-tender in all quadrants. No rigidity, no guarding. No peritoneal signs.  Musculoskeletal:        General: Normal range of motion.     Cervical back: Normal range of motion.  Skin:    General: Skin is warm and dry.     Capillary Refill: Capillary refill takes less than 2 seconds.  Neurological:     Mental Status: She is oriented to person, place, and time.     GCS: GCS eye subscore is 4. GCS verbal subscore is 5. GCS motor subscore is 6.     Comments: Fluent speech, no facial droop.  Psychiatric:        Behavior: Behavior normal.     ED Results / Procedures / Treatments   Labs (all labs ordered are listed, but only abnormal results are displayed) Labs Reviewed  CBC WITH DIFFERENTIAL/PLATELET - Abnormal; Notable for the following components:  Result Value   Hemoglobin 11.9 (*)    All other components within normal limits  COMPREHENSIVE METABOLIC PANEL - Abnormal; Notable for the following components:   Potassium 3.0 (*)    Glucose, Bld  171 (*)    Calcium 8.2 (*)    Albumin 3.3 (*)    AST 52 (*)    Total Bilirubin 1.3 (*)    All other components within normal limits  D-DIMER, QUANTITATIVE (NOT AT [facility]) - Abnormal; Notable for the following components:   D-Dimer, Quant 1.17 (*)    All other components within normal limits  CULTURE, BLOOD (ROUTINE X 2)  CULTURE, BLOOD (ROUTINE X 2)  LACTIC ACID, PLASMA  URINALYSIS, ROUTINE W REFLEX MICROSCOPIC  TROPONIN I (HIGH SENSITIVITY)    EKG EKG Interpretation  Date/Time:  Friday July 13 2019 11:20:42 EST Ventricular Rate:  87 PR Interval:    QRS Duration: 85 QT Interval:  371 QTC Calculation: 447 R Axis:   13 Text Interpretation: Sinus rhythm Low voltage, extremity and precordial leads No significant change was found Confirmed by Ezequiel Essex [phone]) on 07/13/2019 11:46:34 AM   Radiology DG Chest Port 1 View  Result Date: 07/13/2019 CLINICAL DATA:  Shortness of breath.  COVID diagnosis 07/04/2019. EXAM: PORTABLE CHEST 1 VIEW COMPARISON:  08/14/2015 FINDINGS: Hazy density peripherally in the right mid upper lung favoring pneumonia. The lungs appear otherwise clear. Bilateral breast implants with capsular calcifications. Cardiac and mediastinal margins appear normal. No blunting of the costophrenic angles. IMPRESSION: 1. Right mid to upper lung airspace opacity peripherally, favoring pneumonia given the clinical circumstances. This is most likely in the right upper lobe but could be in the superior segment right lower lobe. Electronically Signed   By: Van Clines M.D.   On: 07/13/2019 11:58    Procedures Procedures (including critical care time)  Medications Ordered in ED Medications  sodium chloride 0.9 % bolus 1,000 mL (1,000 mLs Intravenous New Bag/Given 07/13/19 1140)  acetaminophen (TYLENOL) tablet 650 mg (650 mg Oral Given 07/13/19 1141)  ondansetron (ZOFRAN) injection 4 mg (4 mg Intravenous Given 07/13/19 1325)  potassium chloride SA (KLOR-CON) CR  tablet 40 mEq (40 mEq Oral Given 07/13/19 1325)    ED Course  I have reviewed the triage vital signs and the nursing notes.  Pertinent labs & imaging results that were available during my care of the patient were reviewed by me and considered in my medical decision making (see chart for details).    MDM Rules/Calculators/A&P     CHA2DS2/VAS Stroke Risk Points      N/A >= 2 Points: High Risk  1 - 1.99 Points: Medium Risk  0 Points: Low Risk    A final score could not be computed because of missing components.: Last  Change: N/A   This score determines the patient's risk of having a stroke if the  patient has atrial fibrillation.   This score is not applicable to this patient. Components are not  calculated.   Patient seen and examined. Patient is ill-appearing but does not appear toxic.  She is afebrile, no hypoxia but slightly tachypneic with 25 respirations per minute.  She is speaking in full sentences.  On my exam lung sounds are diminished throughout.  She has no abdominal tenderness.  Mucous membranes are dry. Patient ambulated without respiratory distress.  SPO2 stayed above 96% on room air.  CBC without leukocytosis, hemoglobin is 11.9, baseline appears around 13 on chart review. CMP with hypokalemia of 3.0, will replete  with PO potassium. IVF, Tylenol, zofran given. Chest xray viewed by me with possible right mid to upper lung airspace opacity, suggestive of covid pneumonia. EKG without ischemic changes. Lactic acid is within normal range. Troponin is 4, low suspicion for ACS as cause of dyspnea. Blood cultures pending. D-dimer is elevated at 1.17, will proceed with CTA chest to r/o PE. The patient was discussed with and seen by Dr. Wyvonnia Dusky who agrees with the treatment plan, we do not think second trop is necessary.  Patient care transferred to B. Morelli PA-C at the end of my shift pending CTA. Patient presentation, ED course, and plan of care discussed with review of all  pertinent labs and imaging. Please see his note for further details regarding further ED course and disposition.     Portions of this note were generated with [employer]. Dictation errors may occur despite best attempts at proofreading.     Final Clinical Impression(s) / ED Diagnoses Final diagnoses:  Pneumonia due to COVID-19 virus    Rx / DC Orders ED Discharge Orders    None       Flint Melter 07/13/19 1523    Ezequiel Essex, MD 07/14/19 [phone]

## 2019-07-13 NOTE — ED Triage Notes (Signed)
Per EMS, complains of shortness of breath, n/v. Pt was diagnosed with COVID on 12/2. Pt feels dehydrated, reports less urine output. Pt has hx of asthma, diabetes, CVA.   BP 130/70 HR 80 Temp 98.1 RR 20-22 SpO2 93% RA

## 2019-07-13 NOTE — ED Notes (Signed)
Pt ambulated in room, sats remained 96-97. Pt was not short of breath.

## 2019-07-16 LAB — URINE CULTURE

## 2019-07-18 LAB — CULTURE, BLOOD (ROUTINE X 2)
Culture: NO GROWTH
Culture: NO GROWTH
Special Requests: ADEQUATE
Special Requests: ADEQUATE

## 2019-07-19 ENCOUNTER — Other Ambulatory Visit: Payer: Self-pay | Admitting: Family Medicine

## 2019-07-19 ENCOUNTER — Telehealth: Payer: Self-pay | Admitting: *Deleted

## 2019-07-19 MED ORDER — ONDANSETRON 4 MG PO TBDP: 4.0000 mg | ORAL_TABLET | Freq: Three times a day (TID) | ORAL | 0 refills | Status: DC | PRN

## 2019-07-19 MED ORDER — FLUCONAZOLE 150 MG PO TABS: 150.0000 mg | ORAL_TABLET | Freq: Once | ORAL | 0 refills | Status: AC

## 2019-07-19 NOTE — Progress Notes (Signed)
Ordered fluconazole for patient's suspected vulvovaginal candidiasis and zofran for nausea.

## 2019-07-19 NOTE — Telephone Encounter (Signed)
Pt is requesting a medication for Nausea and "anything for strength if there is something"  To PCP. Christen Bame, CMA

## 2019-08-14 NOTE — Telephone Encounter (Signed)
Completed in a different encounter.  Christen Bame, CMA

## 2019-10-01 DIAGNOSIS — M8580 Other specified disorders of bone density and structure, unspecified site: Secondary | ICD-10-CM | POA: Diagnosis not present

## 2019-10-01 DIAGNOSIS — E1142 Type 2 diabetes mellitus with diabetic polyneuropathy: Secondary | ICD-10-CM | POA: Diagnosis not present

## 2019-10-01 DIAGNOSIS — E1165 Type 2 diabetes mellitus with hyperglycemia: Secondary | ICD-10-CM | POA: Diagnosis not present

## 2019-10-01 DIAGNOSIS — I6930 Unspecified sequelae of cerebral infarction: Secondary | ICD-10-CM | POA: Diagnosis not present

## 2019-10-01 DIAGNOSIS — E876 Hypokalemia: Secondary | ICD-10-CM | POA: Diagnosis not present

## 2019-10-01 DIAGNOSIS — Z794 Long term (current) use of insulin: Secondary | ICD-10-CM | POA: Diagnosis not present

## 2019-10-09 ENCOUNTER — Other Ambulatory Visit: Payer: Self-pay

## 2019-10-09 DIAGNOSIS — J302 Other seasonal allergic rhinitis: Secondary | ICD-10-CM

## 2019-10-09 MED ORDER — CETIRIZINE HCL 10 MG PO TABS: ORAL_TABLET | ORAL | 3 refills | Status: DC

## 2019-10-19 DIAGNOSIS — E1142 Type 2 diabetes mellitus with diabetic polyneuropathy: Secondary | ICD-10-CM | POA: Diagnosis not present

## 2019-10-19 DIAGNOSIS — M8580 Other specified disorders of bone density and structure, unspecified site: Secondary | ICD-10-CM | POA: Diagnosis not present

## 2019-10-19 DIAGNOSIS — E559 Vitamin D deficiency, unspecified: Secondary | ICD-10-CM | POA: Diagnosis not present

## 2019-10-19 DIAGNOSIS — E876 Hypokalemia: Secondary | ICD-10-CM | POA: Diagnosis not present

## 2019-10-19 DIAGNOSIS — E1165 Type 2 diabetes mellitus with hyperglycemia: Secondary | ICD-10-CM | POA: Diagnosis not present

## 2019-10-29 DIAGNOSIS — Z03818 Encounter for observation for suspected exposure to other biological agents ruled out: Secondary | ICD-10-CM | POA: Diagnosis not present

## 2019-10-29 DIAGNOSIS — J189 Pneumonia, unspecified organism: Secondary | ICD-10-CM | POA: Diagnosis not present

## 2019-12-26 ENCOUNTER — Other Ambulatory Visit: Payer: Self-pay

## 2019-12-26 ENCOUNTER — Ambulatory Visit (INDEPENDENT_AMBULATORY_CARE_PROVIDER_SITE_OTHER): Payer: Medicare Other

## 2019-12-26 DIAGNOSIS — Z0000 Encounter for general adult medical examination without abnormal findings: Secondary | ICD-10-CM | POA: Diagnosis not present

## 2019-12-26 NOTE — Patient Instructions (Addendum)
You spoke to Wendy Mcclain, Wendy Mcclain over the phone for your annual wellness visit.  We discussed goals: Goals    . Control Diabetes-obtain A1C <7.0 (pt-stated)     A1C 9.0 in 04/2019.      We also discussed recommended health maintenance. As discussed, you are due for the following. We can obtain health maintenance records for your mammogram and dexa scan. We have scheduled you an apt with PCP 6/16 '@11am'$ .  Health Maintenance  Topic Date Due  . FOOT EXAM  Never done  . OPHTHALMOLOGY EXAM  Never done  . COVID-19 Vaccine (1) Never done  . MAMMOGRAM  Never done  . URINE MICROALBUMIN  07/09/2014  . DEXA SCAN  Never done  . PNA vac Low Risk Adult (1 of 2 - PCV13) Never done  . TETANUS/TDAP  05/28/2018  . HEMOGLOBIN A1C  10/03/2019  . COLONOSCOPY  08/02/2023  . Hepatitis C Screening  Completed   Consider getting the Covid Vaccine. Continue walking for one hour 3x per week! PCP apt scheduled for 6/16 '@11am'$ .  We also discussed getting the covid vaccine after having covid. You can discuss further with PCP in June.  Preventive Care 34 Years and Older, Female Preventive care refers to lifestyle choices and visits with your health care provider that can promote health and wellness. This includes:  A yearly physical exam. This is also called an annual well check.  Regular dental and eye exams.  Immunizations.  Screening for certain conditions.  Healthy lifestyle choices, such as diet and exercise. What can I expect for my preventive care visit? Physical exam Your health care provider will check:  Height and weight. These may be used to calculate body mass index (BMI), which is a measurement that tells if you are at a healthy weight.  Heart rate and blood pressure.  Your skin for abnormal spots. Counseling Your health care provider may ask you questions about:  Alcohol, tobacco, and drug use.  Emotional well-being.  Home and relationship well-being.  Sexual  activity.  Eating habits.  History of falls.  Memory and ability to understand (cognition).  Work and work Statistician.  Pregnancy and menstrual history. What immunizations do I need?  Influenza (flu) vaccine  This is recommended every year. Tetanus, diphtheria, and pertussis (Tdap) vaccine  You may need a Td booster every 10 years. Varicella (chickenpox) vaccine  You may need this vaccine if you have not already been vaccinated. Zoster (shingles) vaccine  You may need this after age 61. Pneumococcal conjugate (PCV13) vaccine  One dose is recommended after age 18. Pneumococcal polysaccharide (PPSV23) vaccine  One dose is recommended after age 37. Measles, mumps, and rubella (MMR) vaccine  You may need at least one dose of MMR if you were born in 1957 or later. You may also need a second dose. Meningococcal conjugate (MenACWY) vaccine  You may need this if you have certain conditions. Hepatitis A vaccine  You may need this if you have certain conditions or if you travel or work in places where you may be exposed to hepatitis A. Hepatitis B vaccine  You may need this if you have certain conditions or if you travel or work in places where you may be exposed to hepatitis B. Haemophilus influenzae type b (Hib) vaccine  You may need this if you have certain conditions. You may receive vaccines as individual doses or as more than one vaccine together in one shot (combination vaccines). Talk with your health care provider  about the risks and benefits of combination vaccines. What tests do I need? Blood tests  Lipid and cholesterol levels. These may be checked every 5 years, or more frequently depending on your overall health.  Hepatitis C test.  Hepatitis B test. Screening  Lung cancer screening. You may have this screening every year starting at age 67 if you have a 30-pack-year history of smoking and currently smoke or have quit within the past 15  years.  Colorectal cancer screening. All adults should have this screening starting at age 33 and continuing until age 18. Your health care provider may recommend screening at age 20 if you are at increased risk. You will have tests every 1-10 years, depending on your results and the type of screening test.  Diabetes screening. This is done by checking your blood sugar (glucose) after you have not eaten for a while (fasting). You may have this done every 1-3 years.  Mammogram. This may be done every 1-2 years. Talk with your health care provider about how often you should have regular mammograms.  BRCA-related cancer screening. This may be done if you have a family history of breast, ovarian, tubal, or peritoneal cancers. Other tests  Sexually transmitted disease (STD) testing.  Bone density scan. This is done to screen for osteoporosis. You may have this done starting at age 26. Follow these instructions at home: Eating and drinking  Eat a diet that includes fresh fruits and vegetables, whole grains, lean protein, and low-fat dairy products. Limit your intake of foods with high amounts of sugar, saturated fats, and salt.  Take vitamin and mineral supplements as recommended by your health care provider.  Do not drink alcohol if your health care provider tells you not to drink.  If you drink alcohol: ? Limit how much you have to 0-1 drink a day. ? Be aware of how much alcohol is in your drink. In the [place] one drink equals one 12 oz bottle of beer (355 mL), one 5 oz glass of wine (148 mL), or one 1 oz glass of hard liquor (44 mL). Lifestyle  Take daily care of your teeth and gums.  Stay active. Exercise for at least 30 minutes on 5 or more days each week.  Do not use any products that contain nicotine or tobacco, such as cigarettes, e-cigarettes, and chewing tobacco. If you need help quitting, ask your health care provider.  If you are sexually active, practice safe sex. Use a  condom or other form of protection in order to prevent STIs (sexually transmitted infections).  Talk with your health care provider about taking a low-dose aspirin or statin. What's next?  Go to your health care provider once a year for a well check visit.  Ask your health care provider how often you should have your eyes and teeth checked.  Stay up to date on all vaccines. This information is not intended to replace advice given to you by your health care provider. Make sure you discuss any questions you have with your health care provider. Document Revised: 07/13/2018 Document Reviewed: 07/13/2018 Elsevier Patient Education  2020 [place] [facility]'s number is [phone]. Please call with questions or concerns about what we discussed today.

## 2019-12-26 NOTE — Progress Notes (Addendum)
Subjective:   Wendy Mcclain is a 69 y.o. female who presents for Medicare Annual (Subsequent) preventive examination.  The patient consented to a virtual visit.  Review of Systems: Defer to PCP.  Cardiac Risk Factors include: advanced age (>36men, >14 women)  Objective:   Vitals: There were no vitals taken for this visit.  There is no height or weight on file to calculate BMI.  Advanced Directives 12/26/2019 04/05/2019 10/04/2018 09/29/2018 09/28/2018 06/13/2018 01/30/2018  Does Patient Have a Medical Advance Directive? Yes No Yes Yes Yes No No  Type of Paramedic of [place];Living will - Healthcare Power of [place];Living will [place];Living will - -  Does patient want to make changes to medical advance directive? No - Guardian declined - No - Patient declined No - Patient declined - - -  Copy of [place] in Chart? No - copy requested - No - copy requested No - copy requested - - -  Would patient like information on creating a medical advance directive? - No - Patient declined - - - No - Patient declined No - Patient declined   Tobacco Social History   Tobacco Use  Smoking Status Never Smoker  Smokeless Tobacco Never Used     Clinical Intake:  Pre-visit preparation completed: Yes  Pain Score: 0-No pain  How often do you need to have someone help you when you read instructions, pamphlets, or other written materials from your doctor or pharmacy?: 2 - Rarely What is the last grade level you completed in school?: high school  Past Medical History:  Diagnosis Date  . Acute sinusitis 08/19/2014  . Allergic rhinitis 07/09/2013  . Anxiety state 06/19/2018  . Arm weakness 09/28/2018  . Arrhythmia   . Asthma   . Asthma 10/23/2018  . Baker's cyst of knee, right 04/20/2017  . Bronchospasm, acute 10/20/2018  . Carpal tunnel syndrome of right wrist 10/05/2018  . Cerebrovascular accident (CVA) ([place])     09/2016: right caudate lacunar infarct secondary to small vessel disease source  . Community acquired pneumonia 10/30/2018  . Diabetes mellitus   . Diabetes mellitus ([place]) 11/23/2011   Sees Dr Buddy Duty, endocrinology  . Elevated blood pressure (not hypertension) 07/09/2013  . Gastro-esophageal reflux 03/20/2014  . GERD (gastroesophageal reflux disease)   . Grade 1 ankle sprain, right, initial encounter 04/07/2019  . Grief reaction 04/20/2017  . Herpes labialis 03/20/2014  . Hyperlipidemia   . Left leg weakness   . Left-sided weakness 10/11/2016  . Mood disorder ([place]) 11/23/2011  . Neural foraminal stenosis of cervical spine 10/05/2018   Right Side   . Orthostatic hypotension 09/18/2015  . OSA (obstructive sleep apnea)   . Paresthesia   . Right arm weakness 09/29/2018  . Sleep disorder 07/09/2013  . Stroke ([place])   . Tension headache 04/20/2017  . Viral URI with cough 01/12/2017  . Vitamin D deficiency 05/20/2014   Past Surgical History:  Procedure Laterality Date  . ABDOMINAL HYSTERECTOMY  1993  . APPENDECTOMY  1963  . BREAST ENHANCEMENT SURGERY  1986   Family History  Problem Relation Age of Onset  . Alzheimer's disease Mother   . Cancer Father        esophageal  . Aortic aneurysm Paternal Aunt        2 aunts died at age 56  . Cancer Sister        stomach  . Cancer Paternal Grandfather  mouth & larynx  . Cancer Paternal Aunt        colon  . Heart disease Other   . Diabetes Other   . Hyperlipidemia Other   . Hypertension Other   . Stroke Other   . Thyroid disease Sister   . Hyperlipidemia Daughter   . Thyroid disease Daughter   . Colon cancer Neg Hx    Social History   Socioeconomic History  . Marital status: Widowed    Spouse name: Not on file  . Number of children: 2  . Years of education: 83  . Highest education level: High school graduate  Occupational History  . Occupation: Retired  Tobacco Use  . Smoking status: Never Smoker  . Smokeless tobacco: Never Used   Substance and Sexual Activity  . Alcohol use: No  . Drug use: No  . Sexual activity: Not Currently  Other Topics Concern  . Not on file  Social History Narrative   Patient lives alone in [place], she is a widow.    Patient has 2 daughters she is very close with.    Patient enjoys going to the beach (daughter has house there) working out, family, TV, and reading.       Social Determinants of Health   Financial Resource Strain: Low Risk   . Difficulty of Paying Living Expenses: Not hard at all  Food Insecurity: No Food Insecurity  . Worried About Charity fundraiser in the Last Year: Never true  . Ran Out of Food in the Last Year: Never true  Transportation Needs: No Transportation Needs  . Lack of Transportation (Medical): No  . Lack of Transportation (Non-Medical): No  Physical Activity: Sufficiently Active  . Days of Exercise per Week: 3 days  . Minutes of Exercise per Session: 60 min  Stress: No Stress Concern Present  . Feeling of Stress : Not at all  Social Connections: Somewhat Isolated  . Frequency of Communication with Friends and Family: More than three times a week  . Frequency of Social Gatherings with Friends and Family: More than three times a week  . Attends Religious Services: More than 4 times per year  . Active Member of Clubs or Organizations: No  . Attends Archivist Meetings: Never  . Marital Status: Widowed   Outpatient Encounter Medications as of 12/26/2019  Medication Sig  . acetaminophen (TYLENOL) 500 MG tablet Take 500-1,000 mg by mouth every 6 (six) hours as needed (for headaches).   Marland Kitchen albuterol (PROVENTIL HFA;VENTOLIN HFA) 108 (90 Base) MCG/ACT inhaler Inhale 2 puffs into the lungs every 6 (six) hours as needed for wheezing or shortness of breath.  Marland Kitchen aspirin 325 MG EC tablet Take 325 mg by mouth daily.  Marland Kitchen atorvastatin (LIPITOR) 40 MG tablet Take 1 tablet (40 mg total) by mouth daily at 6 PM.  . busPIRone (BUSPAR) 7.5 MG tablet Take one  pill twice per day for three days, then increase to two pills twice per day  . cetirizine (ZYRTEC) 10 MG tablet TAKE 1 TABLET(10 MG) BY MOUTH DAILY  . Cholecalciferol (VITAMIN D-3) 25 MCG (1000 UT) CAPS Take 1,000 Units by mouth daily.   . fluticasone (FLONASE) 50 MCG/ACT nasal spray Place 2 sprays into both nostrils daily.  Marland Kitchen HUMALOG KWIKPEN 100 UNIT/ML KiwkPen Inject 16 Units into the skin 3 (three) times daily before meals.   . Insulin Degludec (TRESIBA FLEXTOUCH) 100 UNIT/ML SOPN Inject 28 Units into the skin at bedtime.   . Multiple Vitamin (  MULTIVITAMIN) tablet Take 1 tablet by mouth See admin instructions. Twice weekly by mouth Monday and Wednesday  . omeprazole (PRILOSEC) 20 MG capsule Take 1 capsule (20 mg total) by mouth 3 (three) times a week. (Patient taking differently: Take 20 mg by mouth 2 (two) times daily as needed (reflux). )  . ondansetron (ZOFRAN ODT) 4 MG disintegrating tablet Take 1 tablet (4 mg total) by mouth every 8 (eight) hours as needed for nausea or vomiting.  . sertraline (ZOLOFT) 50 MG tablet Take 1 tablet (50 mg total) by mouth daily.  . sodium chloride (OCEAN) 0.65 % SOLN nasal spray Place 1 spray into both nostrils as needed for congestion.  . gabapentin (NEURONTIN) 300 MG capsule Take 1 capsule (300 mg total) by mouth 3 (three) times daily as needed. (Patient not taking: Reported on 07/13/2019)  . potassium chloride (KLOR-CON) 10 MEQ tablet Take 2 tablets (20 mEq total) by mouth daily for 5 days.   No facility-administered encounter medications on file as of 12/26/2019.   Activities of Daily Living In your present state of health, do you have any difficulty performing the following activities: 12/26/2019  Hearing? N  Vision? N  Difficulty concentrating or making decisions? N  Walking or climbing stairs? N  Dressing or bathing? N  Doing errands, shopping? N  Preparing Food and eating ? N  Using the Toilet? N  In the past six months, have you accidently leaked  urine? N  Do you have problems with loss of bowel control? N  Managing your Medications? N  Managing your Finances? N  Housekeeping or managing your Housekeeping? N  Some recent data might be hidden   Patient Care Team: Kathrene Alu, MD as PCP - Sabra Heck, MD as Consulting Physician (Endocrinology)    Assessment:   This is a routine wellness examination for Wendy Mcclain.  Exercise Activities and Dietary recommendations Current Exercise Habits: Home exercise routine, Type of exercise: treadmill, Time (Minutes): 60, Frequency (Times/Week): 3, Weekly Exercise (Minutes/Week): 180, Intensity: Moderate, Exercise limited by: None identified  Goals    . Control Diabetes-obtain A1C <7.0 (pt-stated)     A1C 9.0 in 04/2019.      Fall Risk Fall Risk  12/26/2019 04/05/2019 10/04/2018 01/30/2018 12/12/2017  Falls in the past year? 0 0 0 No No  Number falls in past yr: - 0 - - -   Is the patient's home free of loose throw rugs in walkways, pet beds, electrical cords, etc?   yes      Grab bars in the bathroom? yes      Handrails on the stairs?   yes      Adequate lighting?   yes  Patient rating of health (0-10) scale: 9   Depression Screen PHQ 2/9 Scores 12/26/2019 04/05/2019 10/04/2018 06/13/2018  PHQ - 2 Score 0 0 0 0    Cognitive Function  6CIT Screen 12/26/2019  What Year? 0 points  What month? 0 points  What time? 0 points  Count back from 20 0 points  Months in reverse 0 points  Repeat phrase 2 points  Total Score 2   Screening Tests Health Maintenance  Topic Date Due  . FOOT EXAM  Never done  . OPHTHALMOLOGY EXAM  Never done  . COVID-19 Vaccine (1) Never done  . MAMMOGRAM  Never done  . URINE MICROALBUMIN  07/09/2014  . DEXA SCAN  Never done  . PNA vac Low Risk Adult (1 of 2 - PCV13) Never done  .  TETANUS/TDAP  05/28/2018  . HEMOGLOBIN A1C  10/03/2019  . COLONOSCOPY  08/02/2023  . Hepatitis C Screening  Completed   Cancer Screenings: Lung: Low Dose CT Chest  recommended if Age 38-80 years, 30 pack-year currently smoking OR have quit w/in 15years. Patient does not qualify. Breast:  Up to date on Mammogram? No   Up to date of Bone Density/Dexa? No Colorectal: UTD  Additional Screenings: Hepatitis C Screening: Completed  HIV Screening: Completed   Plan:  Consider getting the Covid Vaccine. Continue walking for one hour 3x per week! PCP apt scheduled for 6/16 @11am .  I have personally reviewed and noted the following in the patient's chart:   . Medical and social history . Use of alcohol, tobacco or illicit drugs  . Current medications and supplements . Functional ability and status . Nutritional status . Physical activity . Advanced directives . List of other physicians . Hospitalizations, surgeries, and ER visits in previous 12 months . Vitals . Screenings to include cognitive, depression, and falls . Referrals and appointments  In addition, I have reviewed and discussed with patient certain preventive protocols, quality metrics, and best practice recommendations. A written personalized care plan for preventive services as well as general preventive health recommendations were provided to patient.  This visit was conducted virtually in the setting of the [place] pandemic.    Dorna Bloom, [place]  12/26/2019   I have reviewed this visit and agree with the documentation.   Amanda C. Shan Levans, MD PGY-3, [facility] 12/27/2019 9:56 AM

## 2019-12-29 DIAGNOSIS — B372 Candidiasis of skin and nail: Secondary | ICD-10-CM | POA: Diagnosis not present

## 2020-01-16 ENCOUNTER — Ambulatory Visit (INDEPENDENT_AMBULATORY_CARE_PROVIDER_SITE_OTHER): Payer: Medicare Other | Admitting: Family Medicine

## 2020-01-16 ENCOUNTER — Other Ambulatory Visit: Payer: Self-pay

## 2020-01-16 ENCOUNTER — Encounter: Payer: Self-pay | Admitting: Family Medicine

## 2020-01-16 VITALS — BP 118/64 | HR 83 | Ht 63.0 in | Wt 148.0 lb

## 2020-01-16 DIAGNOSIS — L570 Actinic keratosis: Secondary | ICD-10-CM

## 2020-01-16 DIAGNOSIS — E119 Type 2 diabetes mellitus without complications: Secondary | ICD-10-CM | POA: Diagnosis not present

## 2020-01-16 DIAGNOSIS — E785 Hyperlipidemia, unspecified: Secondary | ICD-10-CM

## 2020-01-16 DIAGNOSIS — F39 Unspecified mood [affective] disorder: Secondary | ICD-10-CM | POA: Diagnosis not present

## 2020-01-16 DIAGNOSIS — Z1231 Encounter for screening mammogram for malignant neoplasm of breast: Secondary | ICD-10-CM

## 2020-01-16 LAB — POCT UA - MICROALBUMIN
Creatinine, POC: 50 mg/dL
Microalbumin Ur, POC: 10 mg/L

## 2020-01-16 LAB — POCT GLYCOSYLATED HEMOGLOBIN (HGB A1C): HbA1c, POC (controlled diabetic range): 8.8 % — AB (ref 0.0–7.0)

## 2020-01-16 NOTE — Assessment & Plan Note (Signed)
Advised that patient will need cryotherapy on these lesions since the small percentage can progress to squamous cell carcinoma.  Encourage patient to get this done in the next year.

## 2020-01-16 NOTE — Assessment & Plan Note (Signed)
Well-controlled currently.  Patient was advised on the method of action of Zoloft and encouraged to use this daily or slowly wean off if she would no longer like to be on this medication.

## 2020-01-16 NOTE — Progress Notes (Signed)
    SUBJECTIVE:   CHIEF COMPLAINT / HPI:   Type 2 diabetes mellitus Patient's type 2 diabetes is managed by [facility] endocrinology, but since she has not had her A1c checked yet, she would like this done today.  She would also like her urine microalbumin checked.  She continues to take [place] and Humalog.  She has tried Invokana before and has had yeast infections, and she tried GLP-1 agonist before but got worried about possible side effects.  She continues to take atorvastatin as prescribed.  She sees an eye doctor yearly.  Skin lesions Patient has noticed several erythematous, dry lesions on her chin and forehead recently.  These do not bleed, but they have also not improved.  Depression/anxiety Says that this has improved recently.  She only takes Zoloft as needed.  She does the same with BuSpar.  She has recently bought a house in [place] and is looking forward to moving there.  Screening Has not received a mammogram in quite some time, but would like to have this done.  Has had a DEXA scan in the last few years.  PERTINENT  PMH / PSH: Type 2 diabetes, depression  OBJECTIVE:   BP 118/64   Pulse 83   Ht 5\' 3"  (1.6 m)   Wt 148 lb (67.1 kg)   SpO2 96%   BMI 26.22 kg/m   General: well appearing, appears stated age Cardiac: RRR, no MRG Respiratory: CTAB, no rhonchi, rales, or wheezing, normal work of breathing Skin: Actinic keratoses noted on left side of patient's chin and on right side of forehead, no evidence for malignancy Psych: appropriate mood and affect, in good spirits  Depression screen [facility] 2/9 01/16/2020 12/26/2019 04/05/2019  Decreased Interest 0 0 0  Down, Depressed, Hopeless 0 0 0  PHQ - 2 Score 0 0 0    ASSESSMENT/PLAN:   Diabetes mellitus ([place]) Continues to be above goal with hemoglobin A1c of 8.8, although patient was congratulated on improvement.  She will follow with Dr. Buddy Duty regarding her diabetes.  Encouraged her to discuss possibly restarting a GLP-1  agonist with him after reassurance was provided that the side effects for this medication are usually minimal especially of titrated up slowly.  Will inform patient of her urine microalbumin once it results.  Actinic keratoses Advised that patient will need cryotherapy on these lesions since the small percentage can progress to squamous cell carcinoma.  Encourage patient to get this done in the next year.  Mood disorder Well-controlled currently.  Patient was advised on the method of action of Zoloft and encouraged to use this daily or slowly wean off if she would no longer like to be on this medication.  Hyperlipidemia Continues with atorvastatin.  Will obtain lipid profile today.     Kathrene Alu, MD [place]

## 2020-01-16 NOTE — Assessment & Plan Note (Signed)
Continues with atorvastatin.  Will obtain lipid profile today.

## 2020-01-16 NOTE — Patient Instructions (Addendum)
It was nice seeing you today Wendy Mcclain!  Your hemoglobin A1c is 8.8 today, which is improved from your last measurement.  This is great news.  Please consider discussing with Dr. Buddy Duty the possibility of starting a GLP-1 agonist medication such as Ozempic or Victoza or SGLT 2 inhibitor such as Jardiance or Invokana.  I will be in touch regarding your urine microalbumin and cholesterol results.  It will be important for you to get a mammogram sometime this year.  I have ordered this and will be given you the number for their office to make an appointment.  Please call your eye doctor to have them send [place] your records if you get a chance.  Please get treatment for your actinic keratoses sometime this year so that they do not progress to possible squamous cell cancer.  If you have any questions or concerns, please feel free to call the clinic.   Be well,  Dr. Shan Levans

## 2020-01-16 NOTE — Assessment & Plan Note (Signed)
Continues to be above goal with hemoglobin A1c of 8.8, although patient was congratulated on improvement.  She will follow with Dr. Buddy Duty regarding her diabetes.  Encouraged her to discuss possibly restarting a GLP-1 agonist with him after reassurance was provided that the side effects for this medication are usually minimal especially of titrated up slowly.  Will inform patient of her urine microalbumin once it results.

## 2020-01-17 ENCOUNTER — Telehealth: Payer: Self-pay

## 2020-01-17 LAB — LIPID PANEL
Chol/HDL Ratio: 3.6 ratio (ref 0.0–4.4)
Cholesterol, Total: 207 mg/dL — ABNORMAL HIGH (ref 100–199)
HDL: 57 mg/dL (ref >39)
LDL Chol Calc (NIH): 129 mg/dL — ABNORMAL HIGH (ref 0–99)
Triglycerides: 117 mg/dL (ref 0–149)
VLDL Cholesterol Cal: 21 mg/dL (ref 5–40)

## 2020-01-17 NOTE — Telephone Encounter (Signed)
Patient returns call to nurse line from Dr. Shan Levans. Patient states that she stopped taking the cholesterol medication for the last 1.5 months due to moving. Patient will restart cholesterol medication. However, patient is not interested in starting blood pressure medication at this time.   Patient reports following up with Dr. Buddy Duty at the end of the month.   [name], RN

## 2020-02-26 DIAGNOSIS — J069 Acute upper respiratory infection, unspecified: Secondary | ICD-10-CM | POA: Diagnosis not present

## 2020-02-26 DIAGNOSIS — B372 Candidiasis of skin and nail: Secondary | ICD-10-CM | POA: Diagnosis not present

## 2020-03-13 DIAGNOSIS — I6930 Unspecified sequelae of cerebral infarction: Secondary | ICD-10-CM | POA: Diagnosis not present

## 2020-03-13 DIAGNOSIS — E1142 Type 2 diabetes mellitus with diabetic polyneuropathy: Secondary | ICD-10-CM | POA: Diagnosis not present

## 2020-03-13 DIAGNOSIS — E1165 Type 2 diabetes mellitus with hyperglycemia: Secondary | ICD-10-CM | POA: Diagnosis not present

## 2020-03-13 DIAGNOSIS — M8580 Other specified disorders of bone density and structure, unspecified site: Secondary | ICD-10-CM | POA: Diagnosis not present

## 2020-03-13 DIAGNOSIS — Z794 Long term (current) use of insulin: Secondary | ICD-10-CM | POA: Diagnosis not present

## 2020-04-25 DIAGNOSIS — E1165 Type 2 diabetes mellitus with hyperglycemia: Secondary | ICD-10-CM | POA: Diagnosis not present

## 2020-04-25 DIAGNOSIS — E1142 Type 2 diabetes mellitus with diabetic polyneuropathy: Secondary | ICD-10-CM | POA: Diagnosis not present

## 2020-04-25 DIAGNOSIS — Z794 Long term (current) use of insulin: Secondary | ICD-10-CM | POA: Diagnosis not present

## 2020-04-25 DIAGNOSIS — I6930 Unspecified sequelae of cerebral infarction: Secondary | ICD-10-CM | POA: Diagnosis not present

## 2020-04-25 DIAGNOSIS — M8580 Other specified disorders of bone density and structure, unspecified site: Secondary | ICD-10-CM | POA: Diagnosis not present

## 2020-05-30 DIAGNOSIS — E119 Type 2 diabetes mellitus without complications: Secondary | ICD-10-CM | POA: Diagnosis not present

## 2020-05-30 DIAGNOSIS — H353131 Nonexudative age-related macular degeneration, bilateral, early dry stage: Secondary | ICD-10-CM | POA: Diagnosis not present

## 2020-06-06 ENCOUNTER — Other Ambulatory Visit: Payer: Self-pay

## 2020-06-06 MED ORDER — ALBUTEROL SULFATE HFA 108 (90 BASE) MCG/ACT IN AERS: 2.0000 | INHALATION_SPRAY | Freq: Four times a day (QID) | RESPIRATORY_TRACT | 11 refills | Status: DC | PRN

## 2020-06-17 DIAGNOSIS — J019 Acute sinusitis, unspecified: Secondary | ICD-10-CM | POA: Diagnosis not present

## 2020-07-23 DIAGNOSIS — I6930 Unspecified sequelae of cerebral infarction: Secondary | ICD-10-CM | POA: Diagnosis not present

## 2020-07-23 DIAGNOSIS — E1165 Type 2 diabetes mellitus with hyperglycemia: Secondary | ICD-10-CM | POA: Diagnosis not present

## 2020-07-23 DIAGNOSIS — M8580 Other specified disorders of bone density and structure, unspecified site: Secondary | ICD-10-CM | POA: Diagnosis not present

## 2020-07-23 DIAGNOSIS — E1142 Type 2 diabetes mellitus with diabetic polyneuropathy: Secondary | ICD-10-CM | POA: Diagnosis not present

## 2020-07-23 DIAGNOSIS — Z794 Long term (current) use of insulin: Secondary | ICD-10-CM | POA: Diagnosis not present

## 2020-07-30 ENCOUNTER — Telehealth: Payer: Self-pay

## 2020-07-30 NOTE — Telephone Encounter (Signed)
Patient calls nurse line regarding coming into contact with MRSA. Patient reports that over the holiday her grandson's girlfriend was staying with her for a couple of nights. On Tuesday evening, girlfriend called and stated that she had developed a pimple that was infected with MRSA. Patient denies direct exposure to abscess and has washed all linens and disinfected area that was exposed.   Patient denies having any open sores or wounds.   Please advise any additional recommendations.   Veronda Prude, RN

## 2020-07-31 NOTE — Telephone Encounter (Signed)
If develops any areas of concerns can make an appointment to be evaluated.  Continue current management  Dana Allan, MD [facility]

## 2020-08-08 DIAGNOSIS — R051 Acute cough: Secondary | ICD-10-CM | POA: Diagnosis not present

## 2020-08-08 DIAGNOSIS — Z20822 Contact with and (suspected) exposure to covid-19: Secondary | ICD-10-CM | POA: Diagnosis not present

## 2020-08-11 ENCOUNTER — Other Ambulatory Visit: Payer: Self-pay

## 2020-08-11 ENCOUNTER — Ambulatory Visit (INDEPENDENT_AMBULATORY_CARE_PROVIDER_SITE_OTHER): Payer: Medicare Other | Admitting: Family Medicine

## 2020-08-11 VITALS — BP 112/72 | HR 73 | Ht 63.0 in

## 2020-08-11 DIAGNOSIS — R058 Other specified cough: Secondary | ICD-10-CM

## 2020-08-11 DIAGNOSIS — R0981 Nasal congestion: Secondary | ICD-10-CM

## 2020-08-11 DIAGNOSIS — Z20822 Contact with and (suspected) exposure to covid-19: Secondary | ICD-10-CM

## 2020-08-11 DIAGNOSIS — R059 Cough, unspecified: Secondary | ICD-10-CM | POA: Diagnosis not present

## 2020-08-11 NOTE — Progress Notes (Signed)
    SUBJECTIVE:   CHIEF COMPLAINT / HPI:   wednesday night her grandson tested positive for covid.  Patient started having symptoms Friday and did the home test Friday. Symptoms include Nasal congestion with mild cough.  Cough is worse at night.  No fevers.  Taking tylenol for discomfort.  Takes aspirin already due to stroke a few years ago.  Had covid pna in dec 2020.  Had bacterial pneumonia in march.    PERTINENT  PMH / PSH: asthma, cva  OBJECTIVE:   BP 112/72   Pulse 73   Ht 5\' 3"  (1.6 m)   SpO2 98%   BMI 26.22 kg/m   Gen: alert, oriented. No acute distress.  Heent: moist oral mucosa. Perrla.  Cv: RRR. No murmurs Pulm: lctab.  No wheezes or crackles. No tachypnea   ASSESSMENT/PLAN:   Cough with exposure to COVID-19 virus Respiratory symptoms consistent with covid and recent exposure to known covid patient.  Will get covid test today.  Had covid pna approximately one year ago.  Discussed symptomatic mgmt and advised pt to go to ed if she develops respiratory distress.      Benay Pike, MD [place]

## 2020-08-11 NOTE — Patient Instructions (Signed)
We have tested you for covid.  We will call you with the results when we get them.  If you have any difficulty breathing go to the emergency department.    Have a great day   Clemetine Marker, MD

## 2020-08-12 DIAGNOSIS — R059 Cough, unspecified: Secondary | ICD-10-CM | POA: Insufficient documentation

## 2020-08-12 NOTE — Assessment & Plan Note (Signed)
Respiratory symptoms consistent with covid and recent exposure to known covid patient.  Will get covid test today.  Had covid pna approximately one year ago.  Discussed symptomatic mgmt and advised pt to go to ed if she develops respiratory distress.

## 2020-08-13 LAB — SARS-COV-2, NAA 2 DAY TAT

## 2020-08-13 LAB — NOVEL CORONAVIRUS, NAA: SARS-CoV-2, NAA: NOT DETECTED

## 2020-09-22 DIAGNOSIS — C44329 Squamous cell carcinoma of skin of other parts of face: Secondary | ICD-10-CM | POA: Diagnosis not present

## 2020-09-22 DIAGNOSIS — L570 Actinic keratosis: Secondary | ICD-10-CM | POA: Diagnosis not present

## 2020-09-22 DIAGNOSIS — D485 Neoplasm of uncertain behavior of skin: Secondary | ICD-10-CM | POA: Diagnosis not present

## 2020-10-21 DIAGNOSIS — C44329 Squamous cell carcinoma of skin of other parts of face: Secondary | ICD-10-CM | POA: Diagnosis not present

## 2020-10-28 ENCOUNTER — Other Ambulatory Visit: Payer: Self-pay

## 2020-10-28 MED ORDER — ATORVASTATIN CALCIUM 40 MG PO TABS: 40.0000 mg | ORAL_TABLET | Freq: Every day | ORAL | 3 refills | Status: DC

## 2020-11-17 DIAGNOSIS — M8580 Other specified disorders of bone density and structure, unspecified site: Secondary | ICD-10-CM | POA: Diagnosis not present

## 2020-11-17 DIAGNOSIS — Z794 Long term (current) use of insulin: Secondary | ICD-10-CM | POA: Diagnosis not present

## 2020-11-17 DIAGNOSIS — E1142 Type 2 diabetes mellitus with diabetic polyneuropathy: Secondary | ICD-10-CM | POA: Diagnosis not present

## 2020-11-17 DIAGNOSIS — I6930 Unspecified sequelae of cerebral infarction: Secondary | ICD-10-CM | POA: Diagnosis not present

## 2020-11-17 DIAGNOSIS — E1165 Type 2 diabetes mellitus with hyperglycemia: Secondary | ICD-10-CM | POA: Diagnosis not present

## 2020-11-27 DIAGNOSIS — E1165 Type 2 diabetes mellitus with hyperglycemia: Secondary | ICD-10-CM | POA: Diagnosis not present

## 2020-11-27 DIAGNOSIS — E1142 Type 2 diabetes mellitus with diabetic polyneuropathy: Secondary | ICD-10-CM | POA: Diagnosis not present

## 2020-12-01 DIAGNOSIS — J019 Acute sinusitis, unspecified: Secondary | ICD-10-CM | POA: Diagnosis not present

## 2020-12-01 DIAGNOSIS — H66002 Acute suppurative otitis media without spontaneous rupture of ear drum, left ear: Secondary | ICD-10-CM | POA: Diagnosis not present

## 2020-12-01 DIAGNOSIS — Z03818 Encounter for observation for suspected exposure to other biological agents ruled out: Secondary | ICD-10-CM | POA: Diagnosis not present

## 2020-12-16 ENCOUNTER — Other Ambulatory Visit: Payer: Self-pay | Admitting: *Deleted

## 2020-12-16 DIAGNOSIS — J302 Other seasonal allergic rhinitis: Secondary | ICD-10-CM

## 2020-12-16 MED ORDER — CETIRIZINE HCL 10 MG PO TABS: ORAL_TABLET | ORAL | 3 refills | Status: DC

## 2020-12-22 DIAGNOSIS — L304 Erythema intertrigo: Secondary | ICD-10-CM | POA: Diagnosis not present

## 2020-12-22 DIAGNOSIS — L820 Inflamed seborrheic keratosis: Secondary | ICD-10-CM | POA: Diagnosis not present

## 2020-12-22 DIAGNOSIS — L814 Other melanin hyperpigmentation: Secondary | ICD-10-CM | POA: Diagnosis not present

## 2020-12-22 DIAGNOSIS — L578 Other skin changes due to chronic exposure to nonionizing radiation: Secondary | ICD-10-CM | POA: Diagnosis not present

## 2020-12-22 DIAGNOSIS — L905 Scar conditions and fibrosis of skin: Secondary | ICD-10-CM | POA: Diagnosis not present

## 2020-12-22 DIAGNOSIS — D225 Melanocytic nevi of trunk: Secondary | ICD-10-CM | POA: Diagnosis not present

## 2020-12-22 DIAGNOSIS — Z85828 Personal history of other malignant neoplasm of skin: Secondary | ICD-10-CM | POA: Diagnosis not present

## 2020-12-22 DIAGNOSIS — L570 Actinic keratosis: Secondary | ICD-10-CM | POA: Diagnosis not present

## 2021-02-18 DIAGNOSIS — E1142 Type 2 diabetes mellitus with diabetic polyneuropathy: Secondary | ICD-10-CM | POA: Diagnosis not present

## 2021-02-18 DIAGNOSIS — I6930 Unspecified sequelae of cerebral infarction: Secondary | ICD-10-CM | POA: Diagnosis not present

## 2021-02-18 DIAGNOSIS — E1165 Type 2 diabetes mellitus with hyperglycemia: Secondary | ICD-10-CM | POA: Diagnosis not present

## 2021-02-18 DIAGNOSIS — M8580 Other specified disorders of bone density and structure, unspecified site: Secondary | ICD-10-CM | POA: Diagnosis not present

## 2021-02-18 DIAGNOSIS — Z794 Long term (current) use of insulin: Secondary | ICD-10-CM | POA: Diagnosis not present

## 2021-04-25 DIAGNOSIS — J019 Acute sinusitis, unspecified: Secondary | ICD-10-CM | POA: Diagnosis not present

## 2021-04-25 DIAGNOSIS — Z03818 Encounter for observation for suspected exposure to other biological agents ruled out: Secondary | ICD-10-CM | POA: Diagnosis not present

## 2021-04-28 ENCOUNTER — Telehealth: Payer: Self-pay

## 2021-04-28 NOTE — Telephone Encounter (Signed)
Patient calls nurse line reporting a sinus infection for 7 days. Patient reports she went to UC and they gave her amoxicillin, however she is on day 4 and does not see improvement. Patient reports every time she blows her nose blood comes out. Patient denies fever, cough, congestion, or body aches. Patient tested negative for covid. Patient scheduled for tomorrow.

## 2021-04-29 ENCOUNTER — Ambulatory Visit (INDEPENDENT_AMBULATORY_CARE_PROVIDER_SITE_OTHER): Payer: Medicare Other | Admitting: Family Medicine

## 2021-04-29 ENCOUNTER — Encounter: Payer: Self-pay | Admitting: Family Medicine

## 2021-04-29 ENCOUNTER — Other Ambulatory Visit: Payer: Self-pay

## 2021-04-29 VITALS — BP 120/64 | HR 84 | Ht 63.0 in | Wt 152.1 lb

## 2021-04-29 DIAGNOSIS — R040 Epistaxis: Secondary | ICD-10-CM | POA: Diagnosis not present

## 2021-04-29 NOTE — Patient Instructions (Addendum)
It was great to meet you today!  For your nosebleeds we can continue with symptomatic treatment.  I would like to use a pea-sized amount of Vaseline once to twice per day in each nare.  If your symptoms do not improve on the antibiotic please return about 1 day after you have completed the course and we can consider additional antibiotics if needed.  If you develop any nosebleeds that do not stop with pressure or feel that they are draining down the back of your throat please return or go to the emergency department as we discussed.

## 2021-04-29 NOTE — Progress Notes (Signed)
    SUBJECTIVE:   CHIEF COMPLAINT / HPI:   Nosebleeds in the setting of sinus infection: 70 year old female presenting for nosebleeds.  Per chart review it appears that she had symptoms of a sinus infection about 7 days and went to urgent care who gave her amoxicillin.  She states that she is on day 5 of this and is not seeing improvement having some blood when she blows her nose.  She did test negative for COVID-19. She states that when she blows her nose she sees it on the tissue. She is not having any blood dripping out of her nose and is not having any dripping down her throat. She has tried some neosporin in her nose but hasn't had much effect.  PERTINENT  PMH / PSH: None relevant   OBJECTIVE:   BP 120/64   Pulse 84   Ht 5\' 3"  (1.6 m)   Wt 152 lb 2 oz (69 kg)   SpO2 98%   BMI 26.95 kg/m    General: NAD, pleasant, able to participate in exam HEENT: Nasal turbinates appear red and irritated with no dried blood present.  Nasal mucosa dry appearing.  No pharyngeal erythema Respiratory: No respiratory distress breathing comfortably on room air Psych: Normal affect and mood  ASSESSMENT/PLAN:   Nosebleeds: Patient noting blood on the tissue with some small clots when blowing her nose.  She is currently dealing with a sinus infection and is on amoxicillin for this per urgent care.  Physical exam with no active bleeding.  Most likely etiology is an anterior nosebleed secondary to irritation and dry nasal mucosa.  She had been using some occasional Neosporin up in the anterior aspect of her nose.  I recommended against this and recommended using Vaseline.  Also discussed that she could use a humidifier in her bedroom at night to help keep the area a bit more moist.  Discussed in detail anterior versus posterior nosebleeds, return precautions, reasons to go to the emergency department, and treatment modalities.  Patient plans to finish her course of antibiotics and if she is still experiencing  symptoms of sinus infection will return.   Lurline Del, [place]

## 2021-05-04 ENCOUNTER — Telehealth: Payer: Self-pay | Admitting: *Deleted

## 2021-05-04 NOTE — Telephone Encounter (Signed)
Pt calls because she still still having the same issues as at her last visit.   She is interested in the additional antibiotics Dr. Vanessa Inwood and her discussed, but does not have a ride to get her ( let her nephew borrow her car this week).  She wants to know if he will call in more meds or if she must be seen can it be a virtual. Christen Bame, CMA

## 2021-05-05 ENCOUNTER — Other Ambulatory Visit: Payer: Self-pay | Admitting: Family Medicine

## 2021-05-05 MED ORDER — AMOXICILLIN-POT CLAVULANATE 500-125 MG PO TABS: 1.0000 | ORAL_TABLET | Freq: Two times a day (BID) | ORAL | 0 refills | Status: AC

## 2021-05-05 NOTE — Progress Notes (Signed)
Will call in Augmentin for 5 days for patient due to sinusitis that failed amoxicillin. Symptom duration approximately 12 days. Discussed case with Dr. Andria Frames.

## 2021-05-05 NOTE — Telephone Encounter (Signed)
Patient called back and is aware that antibiotics have been sent.  Zorana Brockwell,CMA

## 2021-05-21 DIAGNOSIS — E1165 Type 2 diabetes mellitus with hyperglycemia: Secondary | ICD-10-CM | POA: Diagnosis not present

## 2021-05-21 DIAGNOSIS — Z794 Long term (current) use of insulin: Secondary | ICD-10-CM | POA: Diagnosis not present

## 2021-05-21 DIAGNOSIS — E1142 Type 2 diabetes mellitus with diabetic polyneuropathy: Secondary | ICD-10-CM | POA: Diagnosis not present

## 2021-05-21 DIAGNOSIS — M8580 Other specified disorders of bone density and structure, unspecified site: Secondary | ICD-10-CM | POA: Diagnosis not present

## 2021-05-21 DIAGNOSIS — I6930 Unspecified sequelae of cerebral infarction: Secondary | ICD-10-CM | POA: Diagnosis not present

## 2021-06-01 DIAGNOSIS — E119 Type 2 diabetes mellitus without complications: Secondary | ICD-10-CM | POA: Diagnosis not present

## 2021-06-01 DIAGNOSIS — H353131 Nonexudative age-related macular degeneration, bilateral, early dry stage: Secondary | ICD-10-CM | POA: Diagnosis not present

## 2021-07-21 ENCOUNTER — Encounter: Payer: Self-pay | Admitting: Family Medicine

## 2021-08-06 ENCOUNTER — Other Ambulatory Visit: Payer: Self-pay

## 2021-08-06 ENCOUNTER — Ambulatory Visit (INDEPENDENT_AMBULATORY_CARE_PROVIDER_SITE_OTHER): Payer: Medicare Other | Admitting: Family Medicine

## 2021-08-06 ENCOUNTER — Telehealth: Payer: Self-pay | Admitting: Family Medicine

## 2021-08-06 VITALS — BP 125/55 | HR 79 | Temp 97.9°F | Ht 63.0 in | Wt 151.2 lb

## 2021-08-06 DIAGNOSIS — J069 Acute upper respiratory infection, unspecified: Secondary | ICD-10-CM | POA: Diagnosis not present

## 2021-08-06 NOTE — Patient Instructions (Signed)
Thank you for coming to see me today. It was a pleasure.   COVID/Flu/RSV testing today  I have placed an order for chest xray.  Please go to [place] at [facility] or at [facility] to have this completed.  You do not need an appointment, but if you would like to call them beforehand, their number is [phone].  We will contact you with your results afterwards.   If you develop fevers>100.5, shortness of breath, chest pain, palpitations, dizziness, abdominal pain, nausea, vomiting, diarrhea or cannot eat or drink then please go to the ER immediately.   Please follow-up with PCP as needed  If you have any questions or concerns, please do not hesitate to call the office at [phone].  Best,   Carollee Leitz, MD

## 2021-08-06 NOTE — Progress Notes (Signed)
° ° °  SUBJECTIVE:   CHIEF COMPLAINT / HPI: cough  Reports dry cough for 1 week. Increased use of Albuterol inhaler recently, 3x daily.  Endorses shortness of breath and some wheezing associated with history of Asthma.  Reports right side pain with inspiration and coughing.  Recent travel, 2.5 hrs car drive, over Christmas.  Reports coughing and shortness of breath at that time, daughters checked oxygen and was around 92.  No recent sick contact.   PERTINENT  PMH / PSH:  Asthma  OBJECTIVE:   BP (!) 125/55    Pulse 79    Temp 97.9 F (36.6 C) (Oral)    Ht 5\' 3"  (1.6 m)    Wt 151 lb 3.2 oz (68.6 kg)    SpO2 97%    BMI 26.78 kg/m    General: Alert, no acute distress Cardio: Normal S1 and S2, RRR, no r/m/g Pulm: CTAB, normal work of breathing, no wheezing, stridor or decreased air entry.  Right lateral chest wall tenderness on palpation. Extremities: No peripheral edema.    ASSESSMENT/PLAN:   Viral URI with cough Likely viral etiology given no fevers and benign lung exam.  Low suspicion for CAP. -COVID/FLU/RSV today -Chest xray, 2 view -Symptom management -Strict return precautions provided -Follow up with PCP as needed     Carollee Leitz, MD [place]

## 2021-08-06 NOTE — Telephone Encounter (Signed)
Patient was seen today by Dr Volanda Napoleon today was sent for chest xray they wouldn't do it because doesn't have results back from covid test.  So she couldn't have it done today.  Patient stopped back in office your where at lunch advised will have you call patient and discuss this with her. [phone].  Please call her she very concerned about this.

## 2021-08-06 NOTE — Telephone Encounter (Signed)
Returned call.  No answer and unable to LVM.  She can have xray done when results of COVID return.  If symptoms worsen go to Urgent Care for evaluation.  Carollee Leitz, MD [facility]

## 2021-08-07 ENCOUNTER — Other Ambulatory Visit: Payer: Self-pay | Admitting: Family Medicine

## 2021-08-07 DIAGNOSIS — Z1231 Encounter for screening mammogram for malignant neoplasm of breast: Secondary | ICD-10-CM

## 2021-08-07 LAB — COVID-19, FLU A+B AND RSV
Influenza A, NAA: NOT DETECTED
Influenza B, NAA: NOT DETECTED
RSV, NAA: NOT DETECTED
SARS-CoV-2, NAA: NOT DETECTED

## 2021-08-07 NOTE — Telephone Encounter (Signed)
Spoke to pt. She saw the lab results and will go Monday to get the xray. Pt said she has been sneezing a lot today and feel like nothing has changed. I told pt that if symptoms get worse to go to Urgent Care for evaluation. Ottis Stain, CMA

## 2021-08-08 ENCOUNTER — Encounter: Payer: Self-pay | Admitting: Family Medicine

## 2021-08-10 ENCOUNTER — Ambulatory Visit
Admission: RE | Admit: 2021-08-10 | Discharge: 2021-08-10 | Disposition: A | Discharge status: Home / Self Care | Payer: Medicare Other | Source: Ambulatory Visit | Attending: Family Medicine | Admitting: Family Medicine

## 2021-08-10 ENCOUNTER — Encounter: Payer: Self-pay | Admitting: Family Medicine

## 2021-08-10 ENCOUNTER — Other Ambulatory Visit: Payer: Self-pay

## 2021-08-10 DIAGNOSIS — J069 Acute upper respiratory infection, unspecified: Secondary | ICD-10-CM

## 2021-08-10 DIAGNOSIS — R059 Cough, unspecified: Secondary | ICD-10-CM | POA: Diagnosis not present

## 2021-08-11 ENCOUNTER — Encounter: Payer: Self-pay | Admitting: Family Medicine

## 2021-08-11 NOTE — Assessment & Plan Note (Signed)
Likely viral etiology given no fevers and benign lung exam.  Low suspicion for CAP. -COVID/FLU/RSV today -Chest xray, 2 view -Symptom management -Strict return precautions provided -Follow up with PCP as needed

## 2021-08-21 DIAGNOSIS — E1142 Type 2 diabetes mellitus with diabetic polyneuropathy: Secondary | ICD-10-CM | POA: Diagnosis not present

## 2021-08-21 DIAGNOSIS — M8580 Other specified disorders of bone density and structure, unspecified site: Secondary | ICD-10-CM | POA: Diagnosis not present

## 2021-08-21 DIAGNOSIS — E1165 Type 2 diabetes mellitus with hyperglycemia: Secondary | ICD-10-CM | POA: Diagnosis not present

## 2021-08-21 DIAGNOSIS — Z794 Long term (current) use of insulin: Secondary | ICD-10-CM | POA: Diagnosis not present

## 2021-08-21 DIAGNOSIS — I6930 Unspecified sequelae of cerebral infarction: Secondary | ICD-10-CM | POA: Diagnosis not present

## 2021-09-01 ENCOUNTER — Ambulatory Visit
Admission: RE | Admit: 2021-09-01 | Discharge: 2021-09-01 | Disposition: A | Discharge status: Home / Self Care | Payer: Medicare Other | Source: Ambulatory Visit | Attending: Family Medicine | Admitting: Family Medicine

## 2021-09-01 ENCOUNTER — Other Ambulatory Visit: Payer: Self-pay | Admitting: Family Medicine

## 2021-09-01 DIAGNOSIS — Z1231 Encounter for screening mammogram for malignant neoplasm of breast: Secondary | ICD-10-CM

## 2021-09-22 ENCOUNTER — Other Ambulatory Visit: Payer: Self-pay

## 2021-09-22 ENCOUNTER — Encounter: Payer: Self-pay | Admitting: Family Medicine

## 2021-09-22 ENCOUNTER — Ambulatory Visit (INDEPENDENT_AMBULATORY_CARE_PROVIDER_SITE_OTHER): Payer: Medicare Other | Admitting: Family Medicine

## 2021-09-22 VITALS — Ht 62.8 in | Wt 149.7 lb

## 2021-09-22 DIAGNOSIS — Z0000 Encounter for general adult medical examination without abnormal findings: Secondary | ICD-10-CM

## 2021-09-22 MED ORDER — OMEPRAZOLE 20 MG PO CPDR: 20.0000 mg | DELAYED_RELEASE_CAPSULE | ORAL | 0 refills | Status: DC

## 2021-09-22 MED ORDER — ASPIRIN EC 81 MG PO TBEC: 81.0000 mg | DELAYED_RELEASE_TABLET | Freq: Every day | ORAL | 11 refills | Status: AC

## 2021-09-22 NOTE — Patient Instructions (Signed)
Thank you for coming to see me today. It was a pleasure. Today we discussed your aspirin, changing the dose to 81mg  stop 325mg . Sent refill for omprezole.   Please follow-up with me in a few weeks   If you have any questions or concerns, please do not hesitate to call the office at [phone].  Best wishes,   Dr Posey Pronto

## 2021-09-22 NOTE — Progress Notes (Addendum)
Subjective:   Wendy Mcclain is a 71 y.o. female who presents for Medicare Annual (Subsequent) preventive examination.  Patient consented to have virtual visit and was identified by name and date of birth. Method of visit: in person   Encounter participants: Patient: [facility]cl[facility] Nurse/Provider: Lattie Haw - located at [facility] Others (if applicable):     Review of Systems:  negative Cardiac Risk Factors include: diabetes mellitus;dyslipidemia;advanced age (>63men, >66 women)     Objective:     Vitals: Ht 5' 2.8" (1.595 m)    Wt 149 lb 11.1 oz (67.9 kg)    BMI 26.69 kg/m   Body mass index is 26.69 kg/m.  Advanced Directives 09/22/2021 04/29/2021 12/26/2019 04/05/2019 10/04/2018 09/29/2018 09/28/2018  Does Patient Have a Medical Advance Directive? Yes No Yes No Yes Yes Yes  Type of Advance Directive Living will - [place];Living will - Healthcare Power of [place];Living will [place];Living will  Does patient want to make changes to medical advance directive? No - Patient declined - No - Guardian declined - No - Patient declined No - Patient declined -  Copy of [place] in Chart? - - No - copy requested - No - copy requested No - copy requested -  Would patient like information on creating a medical advance directive? - No - Patient declined - No - Patient declined - - -    Tobacco Social History   Tobacco Use  Smoking Status Never  Smokeless Tobacco Never     Counseling given: Not Answered   Clinical Intake:     Pain : No/denies pain     Nutritional Status: BMI 25 -29 Overweight Diabetes: Yes CBG done?: No Did pt. bring in CBG monitor from home?: No  How often do you need to have someone help you when you read instructions, pamphlets, or other written materials from your doctor or pharmacy?: 1 - Never  Interpreter Needed?: No     Past Medical History:   Diagnosis Date   Acute sinusitis 08/19/2014   Allergic rhinitis 07/09/2013   Anxiety state 06/19/2018   Arm weakness 09/28/2018   Arrhythmia    Asthma    Asthma 10/23/2018   Baker's cyst of knee, right 04/20/2017   Bronchospasm, acute 10/20/2018   Carpal tunnel syndrome of right wrist 10/05/2018   Cerebrovascular accident (CVA) ([place])    09/2016: right caudate lacunar infarct secondary to small vessel disease source   Community acquired pneumonia 10/30/2018   Diabetes mellitus    Diabetes mellitus ([place]) 11/23/2011   Sees Dr Buddy Duty, endocrinology   Elevated blood pressure (not hypertension) 07/09/2013   Gastro-esophageal reflux 03/20/2014   GERD (gastroesophageal reflux disease)    Grade 1 ankle sprain, right, initial encounter 04/07/2019   Grief reaction 04/20/2017   Herpes labialis 03/20/2014   Hyperlipidemia    Left leg weakness    Left-sided weakness 10/11/2016   Mood disorder ([place]) 11/23/2011   Neural foraminal stenosis of cervical spine 10/05/2018   Right Side    Orthostatic hypotension 09/18/2015   OSA (obstructive sleep apnea)    Paresthesia    Right arm weakness 09/29/2018   Sleep disorder 07/09/2013   Stroke ([place])    Tension headache 04/20/2017   Viral URI with cough 01/12/2017   Vitamin D deficiency 05/20/2014   Past Surgical History:  Procedure Laterality Date   ABDOMINAL HYSTERECTOMY  08/03/1991   APPENDECTOMY  08/02/1961  AUGMENTATION MAMMAPLASTY     BREAST ENHANCEMENT SURGERY  08/02/1984   Family History  Problem Relation Age of Onset   Alzheimer's disease Mother    Cancer Father        esophageal   Aortic aneurysm Paternal Aunt        2 aunts died at age 91   Cancer Sister        stomach   Cancer Paternal Grandfather        mouth & larynx   Cancer Paternal Aunt        colon   Heart disease Other    Diabetes Other    Hyperlipidemia Other    Hypertension Other    Stroke Other    Thyroid disease Sister    Hyperlipidemia Daughter    Thyroid disease Daughter     Colon cancer Neg Hx    Social History   Socioeconomic History   Marital status: Widowed    Spouse name: Not on file   Number of children: 2   Years of education: 12   Highest education level: High school graduate  Occupational History   Occupation: Retired  Tobacco Use   Smoking status: Never   Smokeless tobacco: Never  Vaping Use   Vaping Use: Never used  Substance and Sexual Activity   Alcohol use: No   Drug use: No   Sexual activity: Not Currently  Other Topics Concern   Not on file  Social History Narrative   Patient lives alone in [place], she is a widow.    Patient has 2 daughters she is very close with.    Patient enjoys going to the beach (daughter has house there) working out, family, TV, and reading.       Social Determinants of Health   Financial Resource Strain: Not on file  Food Insecurity: Not on file  Transportation Needs: Not on file  Physical Activity: Not on file  Stress: Not on file  Social Connections: Not on file    Outpatient Encounter Medications as of 09/22/2021  Medication Sig   albuterol (VENTOLIN HFA) 108 (90 Base) MCG/ACT inhaler Inhale 2 puffs into the lungs every 6 (six) hours as needed for wheezing or shortness of breath.   aspirin EC 81 MG tablet Take 1 tablet (81 mg total) by mouth daily. Swallow whole.   atorvastatin (LIPITOR) 40 MG tablet Take 1 tablet (40 mg total) by mouth daily at 6 PM.   cetirizine (ZYRTEC) 10 MG tablet TAKE 1 TABLET(10 MG) BY MOUTH DAILY   Cholecalciferol (VITAMIN D-3) 25 MCG (1000 UT) CAPS Take 1,000 Units by mouth daily.    fluticasone (FLONASE) 50 MCG/ACT nasal spray Place 2 sprays into both nostrils daily.   insulin degludec (TRESIBA) 100 UNIT/ML FlexTouch Pen Inject 34 Units into the skin at bedtime.   Multiple Vitamin (MULTIVITAMIN) tablet Take 1 tablet by mouth See admin instructions. Twice weekly by mouth Monday and Wednesday   sodium chloride (OCEAN) 0.65 % SOLN nasal spray Place 1 spray into both  nostrils as needed for congestion.   [DISCONTINUED] aspirin 325 MG EC tablet Take 325 mg by mouth daily.   [DISCONTINUED] omeprazole (PRILOSEC) 20 MG capsule Take 1 capsule (20 mg total) by mouth 3 (three) times a week. (Patient taking differently: Take 20 mg by mouth as needed (reflux).)   acetaminophen (TYLENOL) 500 MG tablet Take 500-1,000 mg by mouth every 6 (six) hours as needed (for headaches).  (Patient not taking: Reported on 09/22/2021)   busPIRone (  BUSPAR) 7.5 MG tablet Take one pill twice per day for three days, then increase to two pills twice per day (Patient not taking: Reported on 09/22/2021)   gabapentin (NEURONTIN) 300 MG capsule Take 1 capsule (300 mg total) by mouth 3 (three) times daily as needed. (Patient not taking: Reported on 07/13/2019)   HUMALOG KWIKPEN 100 UNIT/ML KiwkPen Inject 16 Units into the skin 3 (three) times daily before meals.  (Patient not taking: Reported on 09/22/2021)   [START ON 09/23/2021] omeprazole (PRILOSEC) 20 MG capsule Take 1 capsule (20 mg total) by mouth 3 (three) times a week.   ondansetron (ZOFRAN ODT) 4 MG disintegrating tablet Take 1 tablet (4 mg total) by mouth every 8 (eight) hours as needed for nausea or vomiting. (Patient not taking: Reported on 09/22/2021)   potassium chloride (KLOR-CON) 10 MEQ tablet Take 2 tablets (20 mEq total) by mouth daily for 5 days.   sertraline (ZOLOFT) 50 MG tablet Take 1 tablet (50 mg total) by mouth daily. (Patient not taking: Reported on 09/22/2021)   No facility-administered encounter medications on file as of 09/22/2021.    Activities of Daily Living In your present state of health, do you have any difficulty performing the following activities: 09/22/2021  Hearing? Y  Vision? N  Difficulty concentrating or making decisions? N  Walking or climbing stairs? N  Dressing or bathing? N  Doing errands, shopping? N  Preparing Food and eating ? N  Using the Toilet? N  In the past six months, have you accidently  leaked urine? N  Do you have problems with loss of bowel control? N  Managing your Medications? N  Managing your Finances? N  Housekeeping or managing your Housekeeping? N  Some recent data might be hidden    Patient Care Team: Lattie Haw, MD as PCP - General (Family Medicine) Delrae Rend, MD as Consulting Physician (Endocrinology)    Assessment:   This is a routine wellness examination for [place].  Exercise Activities and Dietary recommendations Current Exercise Habits: Structured exercise class, Type of exercise: walking;stretching, Time (Minutes): 20, Frequency (Times/Week): 4, Weekly Exercise (Minutes/Week): 80, Intensity: Moderate, Exercise limited by: None identified   Goals       Become More Active      Timeframe:  Long-Range Goal Priority:  High Start Date:                             Expected End Date:                       Follow Up Date 03/22/2022    - keep track of how long I exercise - keep track of how often I exercise    Why is this important?   It is easy to come up with reasons not to exercise.  These steps will help you get started and have fun doing it.    Notes:       Control Diabetes-obtain A1C <7.0 (pt-stated)      A1C 9.0 in 04/2019.        Fall Risk Fall Risk  09/22/2021 04/29/2021 01/16/2020 12/26/2019 04/05/2019  Falls in the past year? 0 0 0 0 0  Number falls in past yr: 0 0 0 - 0  Injury with Fall? 0 0 0 - -  Follow up - - Falls evaluation completed;Education provided - -   Is the patient's home free of loose throw rugs in  walkways, pet beds, electrical cords, etc?   no      Grab bars in the bathroom? yes      Handrails on the stairs?   yes      Adequate lighting?   yes   Depression Screen PHQ 2/9 Scores 09/22/2021 08/06/2021 04/29/2021 08/11/2020  PHQ - 2 Score 0 0 0 0  PHQ- 9 Score 0 1 0 -     Cognitive Function     6CIT Screen 09/22/2021 12/26/2019  What Year? 0 points 0 points  What month? 0 points 0 points  What time? 0 points  0 points  Count back from 20 0 points 0 points  Months in reverse 0 points 0 points  Repeat phrase 2 points 2 points  Total Score 2 2     There is no immunization history on file for this patient.   Screening Tests Health Maintenance  Topic Date Due   COVID-19 Vaccine (1) Never done   FOOT EXAM  Never done   OPHTHALMOLOGY EXAM  Never done   Zoster Vaccines- Shingrix (1 of 2) Never done   Pneumonia Vaccine 19+ Years old (1 - PCV) Never done   DEXA SCAN  Never done   TETANUS/TDAP  05/28/2018   HEMOGLOBIN A1C  07/17/2020   URINE MICROALBUMIN  01/15/2021   COLONOSCOPY (Pts 45-64yrs Insurance coverage will need to be confirmed)  08/02/2023   MAMMOGRAM  09/02/2023   Hepatitis C Screening  Completed   HPV VACCINES  Aged Out    Cancer Screenings: Lung: Low Dose CT Chest recommended if Age 40-80 years, 20 pack-year currently smoking OR have quit w/in 15years. Patient does not qualify. Breast:  Up to date on Mammogram? Yes   Up to date of Bone Density/Dexa? Yes 5-6 years Colorectal: yes  Additional Screenings: : Hepatitis C Screening: yes     Plan:     I have personally reviewed and noted the following in the patients chart:   Medical and social history Use of alcohol, tobacco or illicit drugs  Current medications and supplements Functional ability and status Nutritional status Physical activity Advanced directives List of other physicians Hospitalizations, surgeries, and ER visits in previous 12 months Vitals Screenings to include cognitive, depression, and falls Referrals and appointments  In addition, I have reviewed and discussed with patient certain preventive protocols, quality metrics, and best practice recommendations. A written personalized care plan for preventive services as well as general preventive health recommendations were provided to patient.    This visit was conducted virtually in the setting of the [place] pandemic.    Lattie Haw,  MD  09/22/2021

## 2021-09-29 ENCOUNTER — Ambulatory Visit: Payer: Medicare Other | Admitting: Family Medicine

## 2021-10-06 NOTE — Progress Notes (Signed)
? ? ? ?  SUBJECTIVE:  ? ?CHIEF COMPLAINT / HPI:  ? ?Wendy Mcclain is a 71 y.o. female presents for diabetes check ? ? ?Diabetes ?Patient's current diabetic medications include jardiance '25mg'$ , tresiba 34 units. Tolerating well without side effects.  Patient endorses compliance with these medications. CBG readings averaging in the 60-100 range. Is having hypoglycemic events 6 times a month. Patient's last A1c was  ?Lab Results  ?Component Value Date  ? HGBA1C 7.8 (A) 10/07/2021  ? HGBA1C 8.8 (A) 01/16/2020  ? HGBA1C 9.0 (A) 04/05/2019  ?Denies abdominal pain, blurred vision, polyuria, polydipsia, hypoglycemia Patient states they understand that diet and exercise can help with her diabetes. ? ?Last Microalbumin, LDL, Creatinine: ?Lab Results  ?Component Value Date  ? MICROALBUR 10 01/16/2020  ? LDLCALC 47 10/07/2021  ? CREATININE 0.94 10/07/2021  ?  ? ?Blocked sinuses ?Pt reports blocked sinuses for a 1 month. Sometimes has ear pain. Allergy medicine has not helped-nasal spray and zyrtec. Denies dyspnea, cough, fevers, chest pain, runny nose.  ? ? ?[place] Office Visit from 10/07/2021 in [facility]  ?PHQ-9 Total Score 0  ? ?  ?  ?  ?PERTINENT  PMH / PSH: DM, HLD, seasonal allergies ? ?OBJECTIVE:  ? ?BP (!) 146/67   Pulse 87   Wt 150 lb 9.6 oz (68.3 kg)   SpO2 98%   BMI 26.85 kg/m?   ? ?Bp 141/80 ? ?General: Alert, no acute distress, pleasant ?Cardio: well perfused  ?Pulm:normal work of breathing ?Neuro: Cranial nerves grossly intact  ? ?ASSESSMENT/PLAN:  ? ?Cerebrovascular accident (CVA) ([place]) ?Pt was previously taking '325mg'$  daily. Reduced to '81mg'$  daily for secondary prevention. ? ?Allergic rhinitis ?Likely seasonal allergies. Less likely to be sinusitis or other URI given hx. Recommended trial of flonase and astelin spray to see if this helps. Also continue zyrtec. Follow up as needed. ? ?Diabetes mellitus ([place]) ?A1c 7.8 today at and goal. Goal 7-8. Pt is having some hypoglycemic CBGs  and also hypoglycemic sx over the last month. She recently started jardiance '25mg'$  one month ago. Recommended she reduces it to 12.'5mg'$  and lets her endocrinologist know who manages her diabetes. Diabetic eye exam and foot exam up to date. Obtained BMP, urine micro-albumin today. ? ?Hyperlipidemia ?Obtained lipid panel today. LDL 47 at goal. Continue atorvastatin 40 mg. ?  ? ?Lattie Haw, MD PGY-3 ?[place]  ?

## 2021-10-07 ENCOUNTER — Ambulatory Visit (INDEPENDENT_AMBULATORY_CARE_PROVIDER_SITE_OTHER): Payer: Medicare Other | Admitting: Family Medicine

## 2021-10-07 ENCOUNTER — Encounter: Payer: Self-pay | Admitting: Family Medicine

## 2021-10-07 ENCOUNTER — Other Ambulatory Visit: Payer: Self-pay

## 2021-10-07 VITALS — BP 146/67 | HR 87 | Wt 150.6 lb

## 2021-10-07 DIAGNOSIS — J302 Other seasonal allergic rhinitis: Secondary | ICD-10-CM

## 2021-10-07 DIAGNOSIS — E785 Hyperlipidemia, unspecified: Secondary | ICD-10-CM | POA: Diagnosis not present

## 2021-10-07 DIAGNOSIS — E119 Type 2 diabetes mellitus without complications: Secondary | ICD-10-CM | POA: Diagnosis not present

## 2021-10-07 DIAGNOSIS — I63311 Cerebral infarction due to thrombosis of right middle cerebral artery: Secondary | ICD-10-CM

## 2021-10-07 LAB — POCT GLYCOSYLATED HEMOGLOBIN (HGB A1C): HbA1c, POC (controlled diabetic range): 7.8 % — AB (ref 0.0–7.0)

## 2021-10-07 MED ORDER — AZELASTINE HCL 0.1 % NA SOLN: 2.0000 | Freq: Two times a day (BID) | NASAL | 12 refills | Status: AC

## 2021-10-07 MED ORDER — FLUTICASONE PROPIONATE 50 MCG/ACT NA SUSP: 2.0000 | Freq: Every day | NASAL | 6 refills | Status: DC

## 2021-10-07 NOTE — Patient Instructions (Addendum)
Thank you for coming to see me today. It was a pleasure.  ? ?Diabetes is very well controlled, well done! Reduced jardiance to 12.5 mg because you are having low sugars. ? ?For sinuses try the nasal sprays ? ?We will get some labs today.  If they are abnormal or we need to do something about them, I will call you.  If they are normal, I will send you a message on MyChart (if it is active) or a letter in the mail.  If you don't hear from [place] in 2 weeks, please call the office at the number below.  ? ?Please follow-up with me in 4 weeks  ? ?If you have any questions or concerns, please do not hesitate to call the office at [phone]. ? ?Best wishes,  ? ?Dr Posey Pronto   ?

## 2021-10-08 LAB — LIPID PANEL
Chol/HDL Ratio: 2 ratio (ref 0.0–4.4)
Cholesterol, Total: 127 mg/dL (ref 100–199)
HDL: 64 mg/dL (ref >39)
LDL Chol Calc (NIH): 47 mg/dL (ref 0–99)
Triglycerides: 82 mg/dL (ref 0–149)
VLDL Cholesterol Cal: 16 mg/dL (ref 5–40)

## 2021-10-08 LAB — BASIC METABOLIC PANEL
BUN/Creatinine Ratio: 13 (ref 12–28)
BUN: 12 mg/dL (ref 8–27)
CO2: 20 mmol/L (ref 20–29)
Calcium: 9.5 mg/dL (ref 8.7–10.3)
Chloride: 103 mmol/L (ref 96–106)
Creatinine, Ser: 0.94 mg/dL (ref 0.57–1.00)
Glucose: 168 mg/dL — ABNORMAL HIGH (ref 70–99)
Potassium: 3.9 mmol/L (ref 3.5–5.2)
Sodium: 138 mmol/L (ref 134–144)
eGFR: 64 mL/min/{1.73_m2} (ref >59)

## 2021-10-08 LAB — MICROALBUMIN / CREATININE URINE RATIO
Creatinine, Urine: 16.9 mg/dL
Microalb/Creat Ratio: <18 mg/g creat (ref 0–29)
Microalbumin, Urine: <3 ug/mL

## 2021-10-08 NOTE — Assessment & Plan Note (Signed)
Pt was previously taking '325mg'$  daily. Reduced to '81mg'$  daily for secondary prevention. ?

## 2021-10-08 NOTE — Assessment & Plan Note (Signed)
A1c 7.8 today at and goal. Goal 7-8. Pt is having some hypoglycemic CBGs and also hypoglycemic sx over the last month. She recently started jardiance '25mg'$  one month ago. Recommended she reduces it to 12.'5mg'$  and lets her endocrinologist know who manages her diabetes. Diabetic eye exam and foot exam up to date. Obtained BMP, urine micro-albumin today. ?

## 2021-10-08 NOTE — Assessment & Plan Note (Addendum)
Obtained lipid panel today. LDL 47 at goal. Continue atorvastatin 40 mg. ?

## 2021-10-08 NOTE — Assessment & Plan Note (Addendum)
Likely seasonal allergies. Less likely to be sinusitis or other URI given hx. Recommended trial of flonase and astelin spray to see if this helps. Also continue zyrtec. Follow up as needed. ?

## 2021-10-14 DIAGNOSIS — J019 Acute sinusitis, unspecified: Secondary | ICD-10-CM | POA: Diagnosis not present

## 2021-10-14 DIAGNOSIS — R051 Acute cough: Secondary | ICD-10-CM | POA: Diagnosis not present

## 2021-11-20 DIAGNOSIS — E1165 Type 2 diabetes mellitus with hyperglycemia: Secondary | ICD-10-CM | POA: Diagnosis not present

## 2021-11-20 DIAGNOSIS — I6930 Unspecified sequelae of cerebral infarction: Secondary | ICD-10-CM | POA: Diagnosis not present

## 2021-11-20 DIAGNOSIS — M8580 Other specified disorders of bone density and structure, unspecified site: Secondary | ICD-10-CM | POA: Diagnosis not present

## 2021-11-20 DIAGNOSIS — E1142 Type 2 diabetes mellitus with diabetic polyneuropathy: Secondary | ICD-10-CM | POA: Diagnosis not present

## 2021-11-20 DIAGNOSIS — Z794 Long term (current) use of insulin: Secondary | ICD-10-CM | POA: Diagnosis not present

## 2021-11-25 ENCOUNTER — Other Ambulatory Visit: Payer: Self-pay | Admitting: Family Medicine

## 2021-12-16 ENCOUNTER — Other Ambulatory Visit: Payer: Self-pay | Admitting: Family Medicine

## 2021-12-16 DIAGNOSIS — J302 Other seasonal allergic rhinitis: Secondary | ICD-10-CM

## 2021-12-22 DIAGNOSIS — D225 Melanocytic nevi of trunk: Secondary | ICD-10-CM | POA: Diagnosis not present

## 2021-12-22 DIAGNOSIS — L570 Actinic keratosis: Secondary | ICD-10-CM | POA: Diagnosis not present

## 2021-12-22 DIAGNOSIS — L853 Xerosis cutis: Secondary | ICD-10-CM | POA: Diagnosis not present

## 2021-12-22 DIAGNOSIS — L814 Other melanin hyperpigmentation: Secondary | ICD-10-CM | POA: Diagnosis not present

## 2021-12-22 DIAGNOSIS — L821 Other seborrheic keratosis: Secondary | ICD-10-CM | POA: Diagnosis not present

## 2021-12-22 DIAGNOSIS — L230 Allergic contact dermatitis due to metals: Secondary | ICD-10-CM | POA: Diagnosis not present

## 2022-01-05 ENCOUNTER — Encounter: Payer: Self-pay | Admitting: *Deleted

## 2022-01-19 ENCOUNTER — Ambulatory Visit (INDEPENDENT_AMBULATORY_CARE_PROVIDER_SITE_OTHER): Payer: Medicare Other | Admitting: Family Medicine

## 2022-01-19 VITALS — BP 135/66 | HR 74 | Temp 97.9°F | Ht 62.0 in | Wt 143.1 lb

## 2022-01-19 DIAGNOSIS — N898 Other specified noninflammatory disorders of vagina: Secondary | ICD-10-CM | POA: Diagnosis not present

## 2022-01-19 DIAGNOSIS — R109 Unspecified abdominal pain: Secondary | ICD-10-CM

## 2022-01-19 DIAGNOSIS — I63311 Cerebral infarction due to thrombosis of right middle cerebral artery: Secondary | ICD-10-CM

## 2022-01-19 DIAGNOSIS — B379 Candidiasis, unspecified: Secondary | ICD-10-CM

## 2022-01-19 DIAGNOSIS — S39012A Strain of muscle, fascia and tendon of lower back, initial encounter: Secondary | ICD-10-CM | POA: Diagnosis not present

## 2022-01-19 LAB — POCT UA - MICROSCOPIC ONLY: WBC, Ur, HPF, POC: NONE SEEN (ref 0–5)

## 2022-01-19 LAB — POCT WET PREP (WET MOUNT)
Clue Cells Wet Prep Whiff POC: NEGATIVE
Trichomonas Wet Prep HPF POC: ABSENT

## 2022-01-19 LAB — POCT URINALYSIS DIP (MANUAL ENTRY)
Bilirubin, UA: NEGATIVE
Glucose, UA: >=1000 mg/dL — AB
Ketones, POC UA: NEGATIVE mg/dL
Leukocytes, UA: NEGATIVE
Nitrite, UA: NEGATIVE
Protein Ur, POC: NEGATIVE mg/dL
Spec Grav, UA: <=1.005 — AB (ref 1.010–1.025)
Urobilinogen, UA: 0.2 E.U./dL
pH, UA: 5.5 (ref 5.0–8.0)

## 2022-01-19 MED ORDER — TIZANIDINE HCL 4 MG PO TABS: 4.0000 mg | ORAL_TABLET | Freq: Every day | ORAL | 0 refills | Status: AC

## 2022-01-19 MED ORDER — FLUCONAZOLE 150 MG PO TABS: 150.0000 mg | ORAL_TABLET | ORAL | 0 refills | Status: AC

## 2022-01-19 MED ORDER — NAPROXEN 500 MG PO TABS: 500.0000 mg | ORAL_TABLET | Freq: Two times a day (BID) | ORAL | 0 refills | Status: DC

## 2022-01-19 NOTE — Patient Instructions (Signed)
For your back pain, Take 1500 mg Tylenol twice a day, take msucle relaxer (tizanidine) at bedtime, take Naprosyn as needed for pain.  Be sure to not pick up anything heavier than a gallon of milk for the next 4 to 6 weeks.  Watch for worsening symptoms such as an increasing weakness or loss of sensation legs, increasing pain and the loss of bladder or bowel function. Should any of these occur, go to the emergency department immediately.    I sent medication to your pharmacy for a vaginal yeast infection.  Take 1 tablet today and then the next tablet 3 days later.

## 2022-01-19 NOTE — Progress Notes (Signed)
   SUBJECTIVE:   CHIEF COMPLAINT / HPI:     Wendy Mcclain is a 71 y.o. female here for:  Flank pain Patient reports left flank pain that radiates down to her back.  She has taken some Tylenol without relief.  Pain began 5 to 6 days ago. Denies recent injury, trauma or falls.  Endorses fatigue.  Has no history of kidney stones.  About 40 years ago she was hospitalized for pyelonephritis. She has felt hot but no fever.  She lifts cases of water and baskets of close from the floor.  Dysuria Pt reports vaginal discharge that is white.  She has some discomfort when she goes to the bathroom.  Has some nausea but no vomiting.  Has some abdominal pain.  No blood in her urine.   PERTINENT  PMH / PSH: reviewed and updated as appropriate   OBJECTIVE:   BP 135/66   Pulse 74   Temp 97.9 F (36.6 C) (Oral)   Ht '5\' 2"'$  (1.575 m)   Wt 143 lb 2 oz (64.9 kg)   SpO2 100%   BMI 26.18 kg/m    GEN: well appearing female in no acute distress  CVS: well perfused  RESP: speaking in full sentences without pause  ABD: soft, non-tender, non-distended, no palpable masses  Pelvic exam: declined pt preferred self-swab MSK: Lumbar spine: - Inspection: no gross deformity or asymmetry, swelling or ecchymosis. No skin changes - Palpation: No TTP over the spinous processes or SI joint tenderness, +paraspinal muscles - ROM: good active ROM of the lumbar spine in flexion and extension  - Strength: 5/5 strength of lower extremity in L4-S1 nerve root distributions b/l - Neuro: sensation intact in the L4-S1 nerve root distribution b/l - Special testing: Negative straight leg raise   ASSESSMENT/PLAN:   Yeast Infection UA with glucosuria. Pt taking Jardiance with new onset vaginal discharge. Wet prep confirmed yeast. Treat with Fluconazole every 72 hours for 2 doses. Pt to discuss continued use of Jardiance with her endocrinologist, Dr Buddy Duty.   Lumbar Muscle Strain Discussed with patient gradually returning  to normal activities, as tolerated. Pt to continue ordinary activities within the limits permitted by pain. Treat with Naproxen sodium  and muscle relaxer at bedtime for pain relief.  Advised patient to avoid other NSAIDs while taking this medication. Counseled patient on red flag symptoms and when to seek immediate care. Avoid lifting items greater than 10 lbs until symptoms resolve.  No red flags suggesting cauda equina syndrome or progressive major motor weakness. Patient to return if symptoms do not improve with conservative treatment.  ED precautions given. Consider imaging at that time.    Lyndee Hensen, DO PGY-3, [facility] 01/19/2022

## 2022-02-08 DIAGNOSIS — H9201 Otalgia, right ear: Secondary | ICD-10-CM | POA: Diagnosis not present

## 2022-02-08 DIAGNOSIS — Z03818 Encounter for observation for suspected exposure to other biological agents ruled out: Secondary | ICD-10-CM | POA: Diagnosis not present

## 2022-02-08 DIAGNOSIS — J069 Acute upper respiratory infection, unspecified: Secondary | ICD-10-CM | POA: Diagnosis not present

## 2022-02-19 DIAGNOSIS — Z794 Long term (current) use of insulin: Secondary | ICD-10-CM | POA: Diagnosis not present

## 2022-02-19 DIAGNOSIS — E1165 Type 2 diabetes mellitus with hyperglycemia: Secondary | ICD-10-CM | POA: Diagnosis not present

## 2022-02-19 DIAGNOSIS — M8580 Other specified disorders of bone density and structure, unspecified site: Secondary | ICD-10-CM | POA: Diagnosis not present

## 2022-02-19 DIAGNOSIS — I6930 Unspecified sequelae of cerebral infarction: Secondary | ICD-10-CM | POA: Diagnosis not present

## 2022-02-19 DIAGNOSIS — E1142 Type 2 diabetes mellitus with diabetic polyneuropathy: Secondary | ICD-10-CM | POA: Diagnosis not present

## 2022-04-16 ENCOUNTER — Ambulatory Visit: Payer: Medicare Other | Admitting: Cardiovascular Disease

## 2022-04-20 ENCOUNTER — Ambulatory Visit: Payer: Medicare Other | Attending: Cardiovascular Disease | Admitting: Cardiovascular Disease

## 2022-04-20 ENCOUNTER — Encounter: Payer: Self-pay | Admitting: Cardiovascular Disease

## 2022-04-20 VITALS — BP 120/70 | Ht 62.0 in | Wt 136.4 lb

## 2022-04-20 DIAGNOSIS — R002 Palpitations: Secondary | ICD-10-CM | POA: Diagnosis not present

## 2022-04-20 DIAGNOSIS — I63311 Cerebral infarction due to thrombosis of right middle cerebral artery: Secondary | ICD-10-CM | POA: Diagnosis not present

## 2022-04-20 NOTE — Progress Notes (Signed)
Cardiology Office Note   Date:  04/20/2022   ID:  ARLAYNE LIGGINS, DOB Jan 21, 1951, MRN [id number]  PCP:  Orvis Brill, DO  Cardiologist:   Mertie Moores, MD   No chief complaint on file.  Problem List 1. Heart murmur - trivial MR  2. Hx of syncope 3. Diabetes Mellitus  4.  CVA      HOLLI RENGEL is a 71 y.o. female who presents for heart murmur. Has a remote hx of syncope.   Has seen Dr. Caryl Comes and had a + tilt table test.  Has occasional CP and occasional lightheadedness  Has occasional CP - not necessarily associated with exertion .   No real exercise.  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  Eats a farily good diet.    May not drink enough water   Non smoker    Oct. 8, 2020   Fraser Din is seen today for a follow-up visit.  I saw her several years ago for episodes of presyncope. Has occasional heart palpitations The palpitations last for several minutes   - not associated with cp, dyspnea or pre syncope Has been at the beach for the past 6 months  Has gained some weight   She has trivial mitral regurgitation by echocardiogram in 2018.  She has about needing to take antibiotics before having dental work.  She does NOT need SBE prophylaxis prior to dental work.  Sept. 19, 2023  Kady is still having some palpitations  Several times a month  Episodes only last for a few seconds   Past Medical History:  Diagnosis Date   Acute sinusitis 08/19/2014   Allergic rhinitis 07/09/2013   Anxiety state 06/19/2018   Arm weakness 09/28/2018   Arrhythmia    Asthma    Asthma 10/23/2018   Baker's cyst of knee, right 04/20/2017   Bronchospasm, acute 10/20/2018   Carpal tunnel syndrome of right wrist 10/05/2018   Cerebrovascular accident (CVA) ([place])    09/2016: right caudate lacunar infarct secondary to small vessel disease source   Community acquired pneumonia 10/30/2018   Diabetes mellitus    Diabetes mellitus ([place]) 11/23/2011   Sees Dr Buddy Duty,  endocrinology   Elevated blood pressure (not hypertension) 07/09/2013   Gastro-esophageal reflux 03/20/2014   GERD (gastroesophageal reflux disease)    Grade 1 ankle sprain, right, initial encounter 04/07/2019   Grief reaction 04/20/2017   Herpes labialis 03/20/2014   Hyperlipidemia    Left leg weakness    Left-sided weakness 10/11/2016   Mood disorder ([place]) 11/23/2011   Neural foraminal stenosis of cervical spine 10/05/2018   Right Side    Orthostatic hypotension 09/18/2015   OSA (obstructive sleep apnea)    Paresthesia    Right arm weakness 09/29/2018   Sleep disorder 07/09/2013   Stroke ([place])    Tension headache 04/20/2017   Viral URI with cough 01/12/2017   Vitamin D deficiency 05/20/2014    Past Surgical History:  Procedure Laterality Date   ABDOMINAL HYSTERECTOMY  08/03/1991   APPENDECTOMY  08/02/1961   AUGMENTATION MAMMAPLASTY     BREAST ENHANCEMENT SURGERY  08/02/1984     Current Outpatient Medications  Medication Sig Dispense Refill   albuterol (VENTOLIN HFA) 108 (90 Base) MCG/ACT inhaler Inhale 2 puffs into the lungs every 6 (six) hours as needed for wheezing or shortness of breath. 18 g 11   aspirin EC 81 MG tablet Take 1 tablet (81 mg total) by mouth  daily. Swallow whole. 30 tablet 11   atorvastatin (LIPITOR) 40 MG tablet TAKE ONE TABLET BY MOUTH EVERY DAY AT 6PM 90 tablet 3   azelastine (ASTELIN) 0.1 % nasal spray Place 2 sprays into both nostrils 2 (two) times daily. Use in each nostril as directed 30 mL 12   cetirizine (ZYRTEC) 10 MG tablet TAKE 1 TABLET(10 MG) BY MOUTH DAILY 90 tablet 3   Cholecalciferol (VITAMIN D-3) 25 MCG (1000 UT) CAPS Take 1,000 Units by mouth daily.      empagliflozin (JARDIANCE) 25 MG TABS tablet Take 25 mg by mouth daily.     fluticasone (FLONASE) 50 MCG/ACT nasal spray Place 2 sprays into both nostrils daily. 16 g 6   insulin degludec (TRESIBA) 100 UNIT/ML FlexTouch Pen Inject 34 Units into the skin at bedtime.     Multiple Vitamin (MULTIVITAMIN)  tablet Take 1 tablet by mouth See admin instructions. Twice weekly by mouth Monday and Wednesday     omeprazole (PRILOSEC) 20 MG capsule Take 1 capsule (20 mg total) by mouth 3 (three) times a week. 90 capsule 0   acetaminophen (TYLENOL) 500 MG tablet Take 500-1,000 mg by mouth every 6 (six) hours as needed (for headaches).  (Patient not taking: Reported on 09/22/2021)     busPIRone (BUSPAR) 7.5 MG tablet Take one pill twice per day for three days, then increase to two pills twice per day (Patient not taking: Reported on 09/22/2021) 90 tablet 0   gabapentin (NEURONTIN) 300 MG capsule Take 1 capsule (300 mg total) by mouth 3 (three) times daily as needed. (Patient not taking: Reported on 07/13/2019) 30 capsule 0   HUMALOG KWIKPEN 100 UNIT/ML KiwkPen Inject 16 Units into the skin 3 (three) times daily before meals.  (Patient not taking: Reported on 09/22/2021)  0   naproxen (NAPROSYN) 500 MG tablet Take 1 tablet (500 mg total) by mouth 2 (two) times daily with a meal. (Patient not taking: Reported on 04/20/2022) 30 tablet 0   ondansetron (ZOFRAN ODT) 4 MG disintegrating tablet Take 1 tablet (4 mg total) by mouth every 8 (eight) hours as needed for nausea or vomiting. (Patient not taking: Reported on 09/22/2021) 20 tablet 0   potassium chloride (KLOR-CON) 10 MEQ tablet Take 2 tablets (20 mEq total) by mouth daily for 5 days. 10 tablet 0   sertraline (ZOLOFT) 50 MG tablet Take 1 tablet (50 mg total) by mouth daily. (Patient not taking: Reported on 09/22/2021) 90 tablet 3   sodium chloride (OCEAN) 0.65 % SOLN nasal spray Place 1 spray into both nostrils as needed for congestion. (Patient not taking: Reported on 04/20/2022) 1 Bottle 0   No current facility-administered medications for this visit.    Allergies:   Metformin and related    Social History:  The patient  reports that she has never smoked. She has never used smokeless tobacco. She reports that she does not drink alcohol and does not use drugs.    Family History:  The patient's family history includes Alzheimer's disease in her mother; Aortic aneurysm in her paternal aunt; Cancer in her father, paternal aunt, paternal grandfather, and sister; Diabetes in an other family member; Heart disease in an other family member; Hyperlipidemia in her daughter and another family member; Hypertension in an other family member; Stroke in an other family member; Thyroid disease in her daughter and sister.    ROS:  Please see the history of present illness.   Physical Exam: Blood pressure 120/70, height '5\' 2"'$  (1.575 m),  weight 136 lb 6.4 oz (61.9 kg), SpO2 99 %.  GEN:  Well nourished, well developed in no acute distress HEENT: Normal NECK: No JVD; No carotid bruits LYMPHATICS: No lymphadenopathy CARDIAC: RRR , no murmurs, rubs, gallops RESPIRATORY:  Clear to auscultation without rales, wheezing or rhonchi  ABDOMEN: Soft, non-tender, non-distended MUSCULOSKELETAL:  No edema; No deformity  SKIN: Warm and dry NEUROLOGIC:  Alert and oriented x 3   EKG:  Sept. 19, 2023:  NSR at 73.   No ST or T chages.    Recent Labs: 10/07/2021: BUN 12; Creatinine, Ser 0.94; Potassium 3.9; Sodium 138    Lipid Panel    Component Value Date/Time   CHOL 127 10/07/2021 1148   TRIG 82 10/07/2021 1148   HDL 64 10/07/2021 1148   CHOLHDL 2.0 10/07/2021 1148   CHOLHDL 2.3 09/29/2018 0229   VLDL 12 09/29/2018 0229   LDLCALC 47 10/07/2021 1148      Wt Readings from Last 3 Encounters:  04/20/22 136 lb 6.4 oz (61.9 kg)  01/19/22 143 lb 2 oz (64.9 kg)  10/07/21 150 lb 9.6 oz (68.3 kg)      Other studies Reviewed: Additional studies/ records that were reviewed today include: . Review of the above records demonstrates:    ASSESSMENT AND PLAN:  1.  Orthostatic hypotension:     seems to be well controlled.   2. Heart murmur: -    no murmur on exam today   3.  Palpitations:  benign,  will tolerate for now.     4.  CVA :   - cont ASA 81 mg a day     Current medicines are reviewed at length with the patient today.  The patient does not have concerns regarding medicines.  The following changes have been made:  no change  Labs/ tests ordered today include:   Orders Placed This Encounter  Procedures   EKG 12-Lead     Disposition:      Mertie Moores, MD  04/20/2022 10:24 PM    [address], [place], [place]  [address] Phone: [phone]; Fax: [phone]

## 2022-04-20 NOTE — Patient Instructions (Signed)
Medication Instructions:  Your physician recommends that you continue on your current medications as directed. Please refer to the Current Medication list given to you today.  *If you need a refill on your cardiac medications before your next appointment, please call your pharmacy*   Lab Work: NONE If you have labs (blood work) drawn today and your tests are completely normal, you will receive your results only by: MyChart Message (if you have MyChart) OR A paper copy in the mail If you have any lab test that is abnormal or we need to change your treatment, we will call you to review the results.   Testing/Procedures: NONE   Follow-Up: At [facility], you and your health needs are our priority.  As part of our continuing mission to provide you with exceptional heart care, we have created designated Provider Care Teams.  These Care Teams include your primary Cardiologist (physician) and Advanced Practice Providers (APPs -  Physician Assistants and Nurse Practitioners) who all work together to provide you with the care you need, when you need it.  Your next appointment:   1 year(s)  The format for your next appointment:   In Person  Provider:   Philip Nahser, MD    Important Information About Sugar       

## 2022-05-04 DIAGNOSIS — L570 Actinic keratosis: Secondary | ICD-10-CM | POA: Diagnosis not present

## 2022-05-27 DIAGNOSIS — M8580 Other specified disorders of bone density and structure, unspecified site: Secondary | ICD-10-CM | POA: Diagnosis not present

## 2022-05-27 DIAGNOSIS — B3731 Acute candidiasis of vulva and vagina: Secondary | ICD-10-CM | POA: Diagnosis not present

## 2022-05-27 DIAGNOSIS — E1142 Type 2 diabetes mellitus with diabetic polyneuropathy: Secondary | ICD-10-CM | POA: Diagnosis not present

## 2022-05-27 DIAGNOSIS — E1165 Type 2 diabetes mellitus with hyperglycemia: Secondary | ICD-10-CM | POA: Diagnosis not present

## 2022-05-27 DIAGNOSIS — I6930 Unspecified sequelae of cerebral infarction: Secondary | ICD-10-CM | POA: Diagnosis not present

## 2022-05-27 DIAGNOSIS — Z794 Long term (current) use of insulin: Secondary | ICD-10-CM | POA: Diagnosis not present

## 2022-06-09 DIAGNOSIS — Z03818 Encounter for observation for suspected exposure to other biological agents ruled out: Secondary | ICD-10-CM | POA: Diagnosis not present

## 2022-06-09 DIAGNOSIS — R059 Cough, unspecified: Secondary | ICD-10-CM | POA: Diagnosis not present

## 2022-06-09 DIAGNOSIS — R519 Headache, unspecified: Secondary | ICD-10-CM | POA: Diagnosis not present

## 2022-06-09 DIAGNOSIS — H9201 Otalgia, right ear: Secondary | ICD-10-CM | POA: Diagnosis not present

## 2022-06-14 DIAGNOSIS — E119 Type 2 diabetes mellitus without complications: Secondary | ICD-10-CM | POA: Diagnosis not present

## 2022-06-14 DIAGNOSIS — H353131 Nonexudative age-related macular degeneration, bilateral, early dry stage: Secondary | ICD-10-CM | POA: Diagnosis not present

## 2022-06-14 LAB — HM DIABETES EYE EXAM

## 2022-07-08 ENCOUNTER — Other Ambulatory Visit: Payer: Self-pay

## 2022-07-09 ENCOUNTER — Ambulatory Visit (INDEPENDENT_AMBULATORY_CARE_PROVIDER_SITE_OTHER): Payer: Medicare Other | Admitting: Family Medicine

## 2022-07-09 ENCOUNTER — Encounter: Payer: Self-pay | Admitting: Family Medicine

## 2022-07-09 VITALS — BP 120/68 | HR 75 | Temp 97.9°F | Ht 62.0 in | Wt 137.6 lb

## 2022-07-09 DIAGNOSIS — K219 Gastro-esophageal reflux disease without esophagitis: Secondary | ICD-10-CM

## 2022-07-09 DIAGNOSIS — Z794 Long term (current) use of insulin: Secondary | ICD-10-CM | POA: Diagnosis not present

## 2022-07-09 DIAGNOSIS — E119 Type 2 diabetes mellitus without complications: Secondary | ICD-10-CM | POA: Diagnosis not present

## 2022-07-09 DIAGNOSIS — E7800 Pure hypercholesterolemia, unspecified: Secondary | ICD-10-CM

## 2022-07-09 DIAGNOSIS — I63311 Cerebral infarction due to thrombosis of right middle cerebral artery: Secondary | ICD-10-CM

## 2022-07-09 MED ORDER — OMEPRAZOLE 20 MG PO CPDR: 20.0000 mg | DELAYED_RELEASE_CAPSULE | ORAL | 2 refills | Status: DC

## 2022-07-09 NOTE — Progress Notes (Signed)
Office Note 07/09/2022  CC:  Chief Complaint  Patient presents with   Establish Care   Diabetes    Last saw Dr.Kerr 6 weeks ago, would like to ensure no changes needed for DM medications Tyler Aas, Humalog)   HPI:  Wendy Mcclain is a 71 y.o. Caucasian female who is here to establish care and discuss blood sugars. Patient's most recent primary MD: Dr. Susa Simmonds with [facility] FP residency. Old records were reviewed prior to or during today's visit.  She is feeling well. She is followed by Dr. Buddy Duty in endocrinology for her longstanding diabetes, on insulin. She just wanted to run her sugars by me today since they have been high lately. She has continuous glucose monitoring patch and review of the pattern shows near normal fasting but consistently over 200 average postprandial. Currently giving herself 24 units of Tresiba daily and 5 units Humalog each meal.  Recently she tried 10 units of Humalog at a meal and her sugar bottomed out.  Past Medical History:  Diagnosis Date   Allergic rhinitis 07/09/2013   Anxiety state 06/19/2018   Arrhythmia    Asthma 10/23/2018   Baker's cyst of knee, right 04/20/2017   Carpal tunnel syndrome of right wrist 10/05/2018   Cerebrovascular accident (CVA) ([place])    09/2016: right caudate lacunar infarct secondary to small vessel disease source. L sided weakness   Diabetes mellitus ([place]) 11/23/2011   Sees Dr Buddy Duty, endocrinology   Diverticulosis    hx of 'itis   Elevated blood pressure (not hypertension) 07/09/2013   GERD (gastroesophageal reflux disease)    Herpes labialis 03/20/2014   Hyperlipidemia    Mood disorder ([place]) 11/23/2011   Neural foraminal stenosis of cervical spine 10/05/2018   Right Side    Orthostatic hypotension 09/18/2015   OSA (obstructive sleep apnea)    Paresthesia    Sleep disorder 07/09/2013   Vitamin D deficiency 05/20/2014    Past Surgical History:  Procedure Laterality Date   ABDOMINAL HYSTERECTOMY  08/03/1991    APPENDECTOMY  08/02/1961   BREAST ENHANCEMENT SURGERY  08/02/1984   COLONOSCOPY     07/2013 no polyps    Family History  Problem Relation Age of Onset   Alzheimer's disease Mother    Cancer Father        esophageal   Cancer Sister        stomach   Thyroid disease Sister    Other Sister        MVA   Crohn's disease Sister    Squamous cell carcinoma Brother    Diabetes Brother    Throat cancer Maternal Grandfather        jaw   Cancer Paternal Grandfather        mouth & larynx   Coronary artery disease Paternal Grandfather    Hyperlipidemia Daughter    Thyroid disease Daughter    Aortic aneurysm Paternal Aunt        2 aunts died at age 27   Cancer Paternal Aunt        colon   Heart disease Other    Diabetes Other    Hyperlipidemia Other    Hypertension Other    Stroke Other    Colon cancer Neg Hx     Social History   Socioeconomic History   Marital status: Widowed    Spouse name: Not on file   Number of children: 2   Years of education: 3   Highest education level: High school graduate  Occupational  History   Occupation: Retired  Tobacco Use   Smoking status: Never   Smokeless tobacco: Never  Vaping Use   Vaping Use: Never used  Substance and Sexual Activity   Alcohol use: No   Drug use: No   Sexual activity: Not Currently  Other Topics Concern   Not on file  Social History Narrative   Patient lives alone in [place], she is a widow.    Patient has 2 daughters she is very close with.    Patient enjoys going to the beach (daughter has house there) working out, family, TV, and reading.       Social Determinants of Health   Financial Resource Strain: Low Risk  (12/26/2019)   Overall Financial Resource Strain (CARDIA)    Difficulty of Paying Living Expenses: Not hard at all  Food Insecurity: No Food Insecurity (12/26/2019)   Hunger Vital Sign    Worried About Running Out of Food in the Last Year: Never true    Ran Out of Food in the Last Year: Never  true  Transportation Needs: No Transportation Needs (12/26/2019)   PRAPARE - Hydrologist (Medical): No    Lack of Transportation (Non-Medical): No  Physical Activity: Sufficiently Active (12/26/2019)   Exercise Vital Sign    Days of Exercise per Week: 3 days    Minutes of Exercise per Session: 60 min  Stress: No Stress Concern Present (12/26/2019)   [place]    Feeling of Stress : Not at all  Social Connections: Moderately Isolated (12/26/2019)   Social Connection and Isolation Panel [NHANES]    Frequency of Communication with Friends and Family: More than three times a week    Frequency of Social Gatherings with Friends and Family: More than three times a week    Attends Religious Services: More than 4 times per year    Active Member of [employer] or Organizations: No    Attends Archivist Meetings: Never    Marital Status: Widowed  Intimate Partner Violence: Not At Risk (12/26/2019)   Humiliation, Afraid, Rape, and Kick questionnaire    Fear of Current or Ex-Partner: No    Emotionally Abused: No    Physically Abused: No    Sexually Abused: No    Outpatient Encounter Medications as of 07/09/2022  Medication Sig   albuterol (VENTOLIN HFA) 108 (90 Base) MCG/ACT inhaler Inhale 2 puffs into the lungs every 6 (six) hours as needed for wheezing or shortness of breath.   aspirin EC 81 MG tablet Take 1 tablet (81 mg total) by mouth daily. Swallow whole.   atorvastatin (LIPITOR) 40 MG tablet TAKE ONE TABLET BY MOUTH EVERY DAY AT 6PM   azelastine (ASTELIN) 0.1 % nasal spray Place 2 sprays into both nostrils 2 (two) times daily. Use in each nostril as directed   cetirizine (ZYRTEC) 10 MG tablet TAKE 1 TABLET(10 MG) BY MOUTH DAILY   Cholecalciferol (VITAMIN D-3) 25 MCG (1000 UT) CAPS Take 1,000 Units by mouth daily.    fluticasone (FLONASE) 50 MCG/ACT nasal spray Place 2 sprays into both nostrils  daily.   HUMALOG KWIKPEN 100 UNIT/ML KiwkPen Inject 5 Units into the skin 3 (three) times daily before meals.   insulin degludec (TRESIBA) 100 UNIT/ML FlexTouch Pen Inject 24 Units into the skin at bedtime.   Multiple Vitamin (MULTIVITAMIN) tablet Take 1 tablet by mouth See admin instructions. Twice weekly by mouth Monday and Wednesday   [  DISCONTINUED] omeprazole (PRILOSEC) 20 MG capsule Take 1 capsule (20 mg total) by mouth 3 (three) times a week.   omeprazole (PRILOSEC) 20 MG capsule Take 1 capsule (20 mg total) by mouth 3 (three) times a week.   [DISCONTINUED] acetaminophen (TYLENOL) 500 MG tablet Take 500-1,000 mg by mouth every 6 (six) hours as needed (for headaches).  (Patient not taking: Reported on 09/22/2021)   [DISCONTINUED] empagliflozin (JARDIANCE) 25 MG TABS tablet Take 25 mg by mouth daily. (Patient not taking: Reported on 07/09/2022)   [DISCONTINUED] potassium chloride (KLOR-CON) 10 MEQ tablet Take 2 tablets (20 mEq total) by mouth daily for 5 days.   No facility-administered encounter medications on file as of 07/09/2022.    Allergies  Allergen Reactions   Metformin And Related Diarrhea    Review of Systems  Constitutional:  Negative for fever.  HENT:  Negative for congestion and sore throat.   Respiratory:  Negative for cough.   Cardiovascular:  Negative for chest pain.  Gastrointestinal:  Negative for abdominal pain and nausea.  Genitourinary:  Negative for dysuria.  Musculoskeletal:  Negative for back pain.  Skin:  Negative for rash.  Neurological:  Negative for weakness and headaches.     PE; Blood pressure 120/68, pulse 75, temperature 97.9 F (36.6 C), temperature source Oral, height _0  (1.575 m), weight 137 lb 9.6 oz (62.4 kg), SpO2 98 %.Body mass index is 25.17 kg/m.   Physical Exam  Gen: Alert, well appearing.  Patient is oriented to person, place, time, and situation. AFFECT: pleasant, lucid thought and speech. NECK; no bruits CV: RRR, no m/r/g.    LUNGS: CTA bilat, nonlabored resps, good aeration in all lung fields. EXT: no clubbing or cyanosis.  no edema.   Pertinent labs:  Last CBC Lab Results  Component Value Date   WBC 5.1 07/13/2019   HGB 11.9 (L) 07/13/2019   HCT 36.2 07/13/2019   MCV 91.6 07/13/2019   MCH 30.1 07/13/2019   RDW 12.9 07/13/2019   PLT 179 16/05/9603   Last metabolic panel Lab Results  Component Value Date   GLUCOSE 168 (H) 10/07/2021   NA 138 10/07/2021   K 3.9 10/07/2021   CL 103 10/07/2021   CO2 20 10/07/2021   BUN 12 10/07/2021   CREATININE 0.94 10/07/2021   EGFR 64 10/07/2021   CALCIUM 9.5 10/07/2021   PROT 7.5 07/13/2019   ALBUMIN 3.3 (L) 07/13/2019   BILITOT 1.3 (H) 07/13/2019   ALKPHOS 113 07/13/2019   AST 52 (H) 07/13/2019   ALT 36 07/13/2019   ANIONGAP 10 07/13/2019   Last lipids Lab Results  Component Value Date   CHOL 127 10/07/2021   HDL 64 10/07/2021   LDLCALC 47 10/07/2021   TRIG 82 10/07/2021   CHOLHDL 2.0 10/07/2021   Last hemoglobin A1c Lab Results  Component Value Date   HGBA1C 7.8 (A) 10/07/2021   Last thyroid functions Lab Results  Component Value Date   TSH 1.440 04/05/2019   Last vitamin D Lab Results  Component Value Date   VD25OH 30 03/26/2014   ASSESSMENT AND PLAN:   New patient, establishing care.  1.  Diabetes without complication, insulin-requiring. She is followed by Dr. Buddy Duty. In review of her sugars today it looks like she has consistent postprandial elevation, but pretty close to normal when fasting. Continue Tresiba 24 units a day and gradually increase Humalog to achieve 2-hour postprandial level of 140-160 consistently. Hemoglobin A1c today. Will forward results to Dr. Buddy Duty. (Of note, patient  stopped Jardiance because she associated this medication with too much weight loss.  Additionally, she refuses to take GLP-1 agonist due to her fears of its effects on the gastrointestinal tract.)  2. hyperlipidemia, history of stroke. Continue  atorvastatin 40 mg a day. Plan lipid panel when fasting in 6 months.  Hepatic panel today.  #3 GERD. She is doing well long-term on 20 mg omeprazole 3 times per week.  Refilled today.  4.  Preventative healthcare  vaccines: Shingrix->declines.  Tdap->declines.  Prevnar 20->declines. Breast cancer screening: Next mammogram due 08/2022. Colon cancer screening: Next colonoscopy due December 2024. Osteoporosis screening: needs DEXA-> she states she had bone density done in the remote past and it was normal. She declines further screening  An After Visit Summary was printed and given to the patient.  Return in about 6 months (around 01/08/2023) for routine chronic illness f/u.  Signed:  Crissie Sickles, MD           07/09/2022

## 2022-07-10 LAB — COMPREHENSIVE METABOLIC PANEL
AG Ratio: 1 (calc) (ref 1.0–2.5)
ALT: 25 U/L (ref 6–29)
AST: 35 U/L (ref 10–35)
Albumin: 4.1 g/dL (ref 3.6–5.1)
Alkaline phosphatase (APISO): 106 U/L (ref 37–153)
BUN: 10 mg/dL (ref 7–25)
CO2: 24 mmol/L (ref 20–32)
Calcium: 9.9 mg/dL (ref 8.6–10.4)
Chloride: 99 mmol/L (ref 98–110)
Creat: 0.91 mg/dL (ref 0.60–1.00)
Globulin: 4.1 g/dL (calc) — ABNORMAL HIGH (ref 1.9–3.7)
Glucose, Bld: 201 mg/dL — ABNORMAL HIGH (ref 65–99)
Potassium: 4 mmol/L (ref 3.5–5.3)
Sodium: 136 mmol/L (ref 135–146)
Total Bilirubin: 0.4 mg/dL (ref 0.2–1.2)
Total Protein: 8.2 g/dL — ABNORMAL HIGH (ref 6.1–8.1)

## 2022-07-10 LAB — HEMOGLOBIN A1C
Hgb A1c MFr Bld: 9.1 % of total Hgb — ABNORMAL HIGH (ref <5.7)
Mean Plasma Glucose: 214 mg/dL
eAG (mmol/L): 11.9 mmol/L

## 2022-08-26 DIAGNOSIS — Z794 Long term (current) use of insulin: Secondary | ICD-10-CM | POA: Diagnosis not present

## 2022-08-26 DIAGNOSIS — I6930 Unspecified sequelae of cerebral infarction: Secondary | ICD-10-CM | POA: Diagnosis not present

## 2022-08-26 DIAGNOSIS — E1142 Type 2 diabetes mellitus with diabetic polyneuropathy: Secondary | ICD-10-CM | POA: Diagnosis not present

## 2022-08-26 DIAGNOSIS — M8580 Other specified disorders of bone density and structure, unspecified site: Secondary | ICD-10-CM | POA: Diagnosis not present

## 2022-09-29 ENCOUNTER — Ambulatory Visit (INDEPENDENT_AMBULATORY_CARE_PROVIDER_SITE_OTHER): Payer: Medicare Other

## 2022-09-29 VITALS — Wt 137.0 lb

## 2022-09-29 DIAGNOSIS — Z0000 Encounter for general adult medical examination without abnormal findings: Secondary | ICD-10-CM

## 2022-09-29 NOTE — Patient Instructions (Signed)
Wendy Mcclain , Thank you for taking time to come for your Medicare Wellness Visit. I appreciate your ongoing commitment to your health goals. Please review the following plan we discussed and let me know if I can assist you in the future.   These are the goals we discussed:  Goals       Become More Active      Timeframe:  Long-Range Goal Priority:  High Start Date:                             Expected End Date:                       Follow Up Date 03/22/2022    - keep track of how long I exercise - keep track of how often I exercise    Why is this important?   It is easy to come up with reasons not to exercise.  These steps will help you get started and have fun doing it.    Notes:       Control Diabetes-obtain A1C <7.0 (pt-stated)      A1C 9.0 in 04/2019.      Patient Stated      A1c down to at least 7 or lower         This is a list of the screening recommended for you and due dates:  Health Maintenance  Topic Date Due   COVID-19 Vaccine (1) Never done   Pneumonia Vaccine (1 of 1 - PCV) Never done   DEXA scan (bone density measurement)  Never done   Yearly kidney health urinalysis for diabetes  10/08/2022   Zoster (Shingles) Vaccine (1 of 2) 10/08/2022*   Flu Shot  10/31/2022*   Hemoglobin A1C  01/08/2023   Complete foot exam   05/19/2023   Eye exam for diabetics  06/05/2023   Yearly kidney function blood test for diabetes  07/10/2023   Colon Cancer Screening  08/02/2023   Mammogram  09/02/2023   Medicare Annual Wellness Visit  09/30/2023   Hepatitis C Screening: USPSTF Recommendation to screen - Ages 18-79 yo.  Completed   HPV Vaccine  Aged Out   DTaP/Tdap/Td vaccine  Discontinued  *Topic was postponed. The date shown is not the original due date.    Advanced directives: Please bring a copy of your health care power of attorney and living will to the office at your convenience.   Conditions/risks identified: get A1C down below 7   Next appointment: Follow up  in one year for your annual wellness visit    Preventive Care 65 Years and Older, Female Preventive care refers to lifestyle choices and visits with your health care provider that can promote health and wellness. What does preventive care include? A yearly physical exam. This is also called an annual well check. Dental exams once or twice a year. Routine eye exams. Ask your health care provider how often you should have your eyes checked. Personal lifestyle choices, including: Daily care of your teeth and gums. Regular physical activity. Eating a healthy diet. Avoiding tobacco and drug use. Limiting alcohol use. Practicing safe sex. Taking low-dose aspirin every day. Taking vitamin and mineral supplements as recommended by your health care provider. What happens during an annual well check? The services and screenings done by your health care provider during your annual well check will depend on your age, overall health, lifestyle risk factors,  and family history of disease. Counseling  Your health care provider may ask you questions about your: Alcohol use. Tobacco use. Drug use. Emotional well-being. Home and relationship well-being. Sexual activity. Eating habits. History of falls. Memory and ability to understand (cognition). Work and work Statistician. Reproductive health. Screening  You may have the following tests or measurements: Height, weight, and BMI. Blood pressure. Lipid and cholesterol levels. These may be checked every 5 years, or more frequently if you are over 59 years old. Skin check. Lung cancer screening. You may have this screening every year starting at age 87 if you have a 30-pack-year history of smoking and currently smoke or have quit within the past 15 years. Fecal occult blood test (FOBT) of the stool. You may have this test every year starting at age 50. Flexible sigmoidoscopy or colonoscopy. You may have a sigmoidoscopy every 5 years or a  colonoscopy every 10 years starting at age 66. Hepatitis C blood test. Hepatitis B blood test. Sexually transmitted disease (STD) testing. Diabetes screening. This is done by checking your blood sugar (glucose) after you have not eaten for a while (fasting). You may have this done every 1-3 years. Bone density scan. This is done to screen for osteoporosis. You may have this done starting at age 53. Mammogram. This may be done every 1-2 years. Talk to your health care provider about how often you should have regular mammograms. Talk with your health care provider about your test results, treatment options, and if necessary, the need for more tests. Vaccines  Your health care provider may recommend certain vaccines, such as: Influenza vaccine. This is recommended every year. Tetanus, diphtheria, and acellular pertussis (Tdap, Td) vaccine. You may need a Td booster every 10 years. Zoster vaccine. You may need this after age 34. Pneumococcal 13-valent conjugate (PCV13) vaccine. One dose is recommended after age 17. Pneumococcal polysaccharide (PPSV23) vaccine. One dose is recommended after age 98. Talk to your health care provider about which screenings and vaccines you need and how often you need them. This information is not intended to replace advice given to you by your health care provider. Make sure you discuss any questions you have with your health care provider. Document Released: 08/15/2015 Document Revised: 04/07/2016 Document Reviewed: 05/20/2015 Elsevier Interactive Patient Education  2017 [place] Prevention in the Home Falls can cause injuries. They can happen to people of all ages. There are many things you can do to make your home safe and to help prevent falls. What can I do on the outside of my home? Regularly fix the edges of walkways and driveways and fix any cracks. Remove anything that might make you trip as you walk through a door, such as a raised step or  threshold. Trim any bushes or trees on the path to your home. Use bright outdoor lighting. Clear any walking paths of anything that might make someone trip, such as rocks or tools. Regularly check to see if handrails are loose or broken. Make sure that both sides of any steps have handrails. Any raised decks and porches should have guardrails on the edges. Have any leaves, snow, or ice cleared regularly. Use sand or salt on walking paths during winter. Clean up any spills in your garage right away. This includes oil or grease spills. What can I do in the bathroom? Use night lights. Install grab bars by the toilet and in the tub and shower. Do not use towel bars as grab bars. Use  non-skid mats or decals in the tub or shower. If you need to sit down in the shower, use a plastic, non-slip stool. Keep the floor dry. Clean up any water that spills on the floor as soon as it happens. Remove soap buildup in the tub or shower regularly. Attach bath mats securely with double-sided non-slip rug tape. Do not have throw rugs and other things on the floor that can make you trip. What can I do in the bedroom? Use night lights. Make sure that you have a light by your bed that is easy to reach. Do not use any sheets or blankets that are too big for your bed. They should not hang down onto the floor. Have a firm chair that has side arms. You can use this for support while you get dressed. Do not have throw rugs and other things on the floor that can make you trip. What can I do in the kitchen? Clean up any spills right away. Avoid walking on wet floors. Keep items that you use a lot in easy-to-reach places. If you need to reach something above you, use a strong step stool that has a grab bar. Keep electrical cords out of the way. Do not use floor polish or wax that makes floors slippery. If you must use wax, use non-skid floor wax. Do not have throw rugs and other things on the floor that can make you  trip. What can I do with my stairs? Do not leave any items on the stairs. Make sure that there are handrails on both sides of the stairs and use them. Fix handrails that are broken or loose. Make sure that handrails are as long as the stairways. Check any carpeting to make sure that it is firmly attached to the stairs. Fix any carpet that is loose or worn. Avoid having throw rugs at the top or bottom of the stairs. If you do have throw rugs, attach them to the floor with carpet tape. Make sure that you have a light switch at the top of the stairs and the bottom of the stairs. If you do not have them, ask someone to add them for you. What else can I do to help prevent falls? Wear shoes that: Do not have high heels. Have rubber bottoms. Are comfortable and fit you well. Are closed at the toe. Do not wear sandals. If you use a stepladder: Make sure that it is fully opened. Do not climb a closed stepladder. Make sure that both sides of the stepladder are locked into place. Ask someone to hold it for you, if possible. Clearly mark and make sure that you can see: Any grab bars or handrails. First and last steps. Where the edge of each step is. Use tools that help you move around (mobility aids) if they are needed. These include: Canes. Walkers. Scooters. Crutches. Turn on the lights when you go into a dark area. Replace any light bulbs as soon as they burn out. Set up your furniture so you have a clear path. Avoid moving your furniture around. If any of your floors are uneven, fix them. If there are any pets around you, be aware of where they are. Review your medicines with your doctor. Some medicines can make you feel dizzy. This can increase your chance of falling. Ask your doctor what other things that you can do to help prevent falls. This information is not intended to replace advice given to you by your health care provider.  Make sure you discuss any questions you have with your  health care provider. Document Released: 05/15/2009 Document Revised: 12/25/2015 Document Reviewed: 08/23/2014 Elsevier Interactive Patient Education  2017 [employer].

## 2022-09-29 NOTE — Progress Notes (Signed)
I connected with  Wendy Mcclain on 09/29/22 by a audio enabled telemedicine application and verified that I am speaking with the correct person using two identifiers.  Patient Location: Home  Provider Location: Home Office  I discussed the limitations of evaluation and management by telemedicine. The patient expressed understanding and agreed to proceed.   Subjective:   Wendy Mcclain is a 72 y.o. female who presents for Medicare Annual (Subsequent) preventive examination.  Review of Systems     Cardiac Risk Factors include: advanced age (>26mn, >>17women);diabetes mellitus;dyslipidemia     Objective:    Today's Vitals   09/29/22 0958  Weight: 137 lb (62.1 kg)   Body mass index is 25.06 kg/m.     09/29/2022   10:08 AM 01/19/2022    8:49 AM 10/07/2021   10:52 AM 09/22/2021   10:03 AM 04/29/2021    9:02 AM 12/26/2019   11:37 AM 04/05/2019    4:04 PM  Advanced Directives  Does Patient Have a Medical Advance Directive? Yes No Yes Yes No Yes No  Type of AParamedicof [place] will  Living will Living will  H[place]Living will   Does patient want to make changes to medical advance directive?    No - Patient declined  No - Guardian declined   Copy of HAnsoniain Chart? No - copy requested     No - copy requested   Would patient like information on creating a medical advance directive?  No - Patient declined No - Patient declined  No - Patient declined  No - Patient declined    Current Medications (verified) Outpatient Encounter Medications as of 09/29/2022  Medication Sig   albuterol (VENTOLIN HFA) 108 (90 Base) MCG/ACT inhaler Inhale 2 puffs into the lungs every 6 (six) hours as needed for wheezing or shortness of breath.   aspirin EC 81 MG tablet Take 1 tablet (81 mg total) by mouth daily. Swallow whole.   atorvastatin (LIPITOR) 40 MG tablet TAKE ONE TABLET BY MOUTH EVERY DAY AT 6PM   azelastine (ASTELIN) 0.1  % nasal spray Place 2 sprays into both nostrils 2 (two) times daily. Use in each nostril as directed   cetirizine (ZYRTEC) 10 MG tablet TAKE 1 TABLET(10 MG) BY MOUTH DAILY   Cholecalciferol (VITAMIN D-3) 25 MCG (1000 UT) CAPS Take 1,000 Units by mouth daily.    Continuous Blood Gluc Sensor (FREESTYLE LIBRE 3 SENSOR) MISC apply to upper arm, change sensor every 14 days . change sensor every 14 days for 84 days   fluticasone (FLONASE) 50 MCG/ACT nasal spray Place 2 sprays into both nostrils daily.   HUMALOG KWIKPEN 100 UNIT/ML KiwkPen Inject 10 Units into the skin 3 (three) times daily before meals.   insulin degludec (TRESIBA) 100 UNIT/ML FlexTouch Pen Inject 20 Units into the skin at bedtime.   Multiple Vitamin (MULTIVITAMIN) tablet Take 1 tablet by mouth See admin instructions. Twice weekly by mouth Monday and Wednesday   omeprazole (PRILOSEC) 20 MG capsule Take 1 capsule (20 mg total) by mouth 3 (three) times a week.   No facility-administered encounter medications on file as of 09/29/2022.    Allergies (verified) Canagliflozin, Dulaglutide, and Metformin and related   History: Past Medical History:  Diagnosis Date   Allergic rhinitis 07/09/2013   Anxiety state 06/19/2018   Arrhythmia    Asthma 10/23/2018   Baker's cyst of knee, right 04/20/2017   Carpal tunnel syndrome of right wrist  10/05/2018   Cerebrovascular accident (CVA) ([place])    09/2016: right caudate lacunar infarct secondary to small vessel disease source. L sided weakness   Diabetes mellitus ([place]) 11/23/2011   Sees Dr Buddy Duty, endocrinology   Diverticulosis    hx of 'itis   Elevated blood pressure (not hypertension) 07/09/2013   GERD (gastroesophageal reflux disease)    Herpes labialis 03/20/2014   Hyperlipidemia    Mood disorder ([place]) 11/23/2011   Neural foraminal stenosis of cervical spine 10/05/2018   Right Side    Orthostatic hypotension 09/18/2015   OSA (obstructive sleep apnea)    Paresthesia    Sleep disorder  07/09/2013   Vitamin D deficiency 05/20/2014   Past Surgical History:  Procedure Laterality Date   ABDOMINAL HYSTERECTOMY  08/03/1991   APPENDECTOMY  08/02/1961   BREAST ENHANCEMENT SURGERY  08/02/1984   COLONOSCOPY     07/2013 no polyps   Family History  Problem Relation Age of Onset   Alzheimer's disease Mother    Cancer Father        esophageal   Cancer Sister        stomach   Thyroid disease Sister    Other Sister        MVA   Crohn's disease Sister    Squamous cell carcinoma Brother    Diabetes Brother    Throat cancer Maternal Grandfather        jaw   Cancer Paternal Grandfather        mouth & larynx   Coronary artery disease Paternal Grandfather    Hyperlipidemia Daughter    Thyroid disease Daughter    Aortic aneurysm Paternal Aunt        2 aunts died at age 54   Cancer Paternal Aunt        colon   Heart disease Other    Diabetes Other    Hyperlipidemia Other    Hypertension Other    Stroke Other    Colon cancer Neg Hx    Social History   Socioeconomic History   Marital status: Widowed    Spouse name: Not on file   Number of children: 2   Years of education: 12   Highest education level: High school graduate  Occupational History   Occupation: Retired  Tobacco Use   Smoking status: Never   Smokeless tobacco: Never  Vaping Use   Vaping Use: Never used  Substance and Sexual Activity   Alcohol use: No   Drug use: No   Sexual activity: Not Currently  Other Topics Concern   Not on file  Social History Narrative   Patient lives alone in [place], she is a widow.    Patient has 2 daughters she is very close with.    Patient enjoys going to the beach (daughter has house there) working out, family, TV, and reading.       Social Determinants of Health   Financial Resource Strain: Low Risk  (09/29/2022)   Overall Financial Resource Strain (CARDIA)    Difficulty of Paying Living Expenses: Not hard at all  Food Insecurity: No Food Insecurity  (09/29/2022)   Hunger Vital Sign    Worried About Running Out of Food in the Last Year: Never true    Ran Out of Food in the Last Year: Never true  Transportation Needs: No Transportation Needs (09/29/2022)   PRAPARE - Hydrologist (Medical): No    Lack of Transportation (Non-Medical): No  Physical Activity: Insufficiently  Active (09/29/2022)   Exercise Vital Sign    Days of Exercise per Week: 4 days    Minutes of Exercise per Session: 30 min  Stress: No Stress Concern Present (09/29/2022)   [place]    Feeling of Stress : Not at all  Social Connections: Moderately Isolated (09/29/2022)   Social Connection and Isolation Panel [NHANES]    Frequency of Communication with Friends and Family: More than three times a week    Frequency of Social Gatherings with Friends and Family: More than three times a week    Attends Religious Services: More than 4 times per year    Active Member of [employer] or Organizations: No    Attends Archivist Meetings: Never    Marital Status: Widowed    Tobacco Counseling Counseling given: Not Answered   Clinical Intake:  Pre-visit preparation completed: Yes  Pain : No/denies pain     BMI - recorded: 25.06 Nutritional Status: BMI 25 -29 Overweight Nutritional Risks: None Diabetes: Yes CBG done?: Yes (108 per pt) CBG resulted in Enter/ Edit results?: No Did pt. bring in CBG monitor from home?: No  How often do you need to have someone help you when you read instructions, pamphlets, or other written materials from your doctor or pharmacy?: 1 - Never  Diabetic?Nutrition Risk Assessment:  Has the patient had any N/V/D within the last 2 months?  No  Does the patient have any non-healing wounds?  No  Has the patient had any unintentional weight loss or weight gain?  No   Diabetes:  Is the patient diabetic?  Yes  If diabetic, was a CBG obtained  today?  Yes  Did the patient bring in their glucometer from home?  No  How often do you monitor your CBG's? daily.   Financial Strains and Diabetes Management:  Are you having any financial strains with the device, your supplies or your medication? No .  Does the patient want to be seen by Chronic Care Management for management of their diabetes?  No  Would the patient like to be referred to a Nutritionist or for Diabetic Management?  No   Diabetic Exams:  Diabetic Eye Exam: Completed 06/04/22 Diabetic Foot Exam: Completed 05/18/22   Interpreter Needed?: No  Information entered by :: Charlott Rakes, LPN   Activities of Daily Living    09/29/2022   10:09 AM  In your present state of health, do you have any difficulty performing the following activities:  Hearing? 0  Vision? 0  Difficulty concentrating or making decisions? 0  Walking or climbing stairs? 0  Dressing or bathing? 0  Doing errands, shopping? 0  Preparing Food and eating ? N  Using the Toilet? N  In the past six months, have you accidently leaked urine? N  Do you have problems with loss of bowel control? N  Managing your Medications? N  Managing your Finances? N  Housekeeping or managing your Housekeeping? N    Patient Care Team: Tammi Sou, MD as PCP - General (Family Medicine) Delrae Rend, MD as Consulting Physician (Endocrinology) Irene Shipper, MD as Consulting Physician (Gastroenterology) Nahser, Wonda Cheng, MD as Consulting Physician (Cardiology) Alanda Slim Neena Rhymes, MD as Consulting Physician (Ophthalmology) Jonette Eva, MD as Referring Physician (Dermatology)  Indicate any recent Medical Services you may have received from other than [facility] providers in the past year (date may be approximate).     Assessment:   This  is a routine wellness examination for Char[place].  Hearing/Vision screen Hearing Screening - Comments:: Pt stated HOH  Vision Screening - Comments:: Pt follows up with dr  Alanda Slim for annual eye exams   Dietary issues and exercise activities discussed: Current Exercise Habits: Home exercise routine, Type of exercise: walking, Time (Minutes): 30, Frequency (Times/Week): 4, Weekly Exercise (Minutes/Week): 120   Goals Addressed             This Visit's Progress    Patient Stated       A1c down to at least 7 or lower        Depression Screen    09/29/2022   10:06 AM 07/09/2022    1:57 PM 01/19/2022    8:47 AM 10/07/2021   10:52 AM 09/22/2021   10:06 AM 08/06/2021   11:48 AM 04/29/2021    9:03 AM  PHQ 2/9 Scores  PHQ - 2 Score 0 0 0 0 0 0 0  PHQ- 9 Score   1 0 0 1 0    Fall Risk    09/29/2022   10:09 AM 01/19/2022    8:50 AM 10/07/2021   10:52 AM 09/22/2021   10:05 AM 04/29/2021    9:03 AM  [place] in the past year? 0 0 0 0 0  Number falls in past yr: 0 0 0 0 0  Injury with Fall? 0 0 0 0 0  Risk for fall due to : Impaired vision      Follow up Falls prevention discussed        FALL RISK PREVENTION PERTAINING TO THE HOME:  Any stairs in or around the home? Yes  If so, are there any without handrails? No  Home free of loose throw rugs in walkways, pet beds, electrical cords, etc? Yes  Adequate lighting in your home to reduce risk of falls? Yes   ASSISTIVE DEVICES UTILIZED TO PREVENT FALLS:  Life alert? No  Use of a cane, walker or w/c? No  Grab bars in the bathroom? Yes  Shower chair or bench in shower? No  Elevated toilet seat or a handicapped toilet? No   TIMED UP AND GO:  Was the test performed? No .  Cognitive Function:        09/29/2022   10:09 AM 09/22/2021   10:12 AM 12/26/2019   11:18 AM  6CIT Screen  What Year? 0 points 0 points 0 points  What month? 0 points 0 points 0 points  What time? 0 points 0 points 0 points  Count back from 20 0 points 0 points 0 points  Months in reverse 0 points 0 points 0 points  Repeat phrase 0 points 2 points 2 points  Total Score 0 points 2 points 2 points     Immunizations  There is no immunization history on file for this patient.    Flu Vaccine status: Declined, Education has been provided regarding the importance of this vaccine but patient still declined. Advised may receive this vaccine at local pharmacy or Health Dept. Aware to provide a copy of the vaccination record if obtained from local pharmacy or Health Dept. Verbalized acceptance and understanding.  Pneumococcal vaccine status: Due, Education has been provided regarding the importance of this vaccine. Advised may receive this vaccine at local pharmacy or Health Dept. Aware to provide a copy of the vaccination record if obtained from local pharmacy or Health Dept. Verbalized acceptance and understanding.  Covid-19 vaccine status: Declined, Education has been  provided regarding the importance of this vaccine but patient still declined. Advised may receive this vaccine at local pharmacy or Health Dept.or vaccine clinic. Aware to provide a copy of the vaccination record if obtained from local pharmacy or Health Dept. Verbalized acceptance and understanding.  Qualifies for Shingles Vaccine? Yes   Zostavax completed No   Shingrix Completed?: No.    Education has been provided regarding the importance of this vaccine. Patient has been advised to call insurance company to determine out of pocket expense if they have not yet received this vaccine. Advised may also receive vaccine at local pharmacy or Health Dept. Verbalized acceptance and understanding.  Screening Tests Health Maintenance  Topic Date Due   COVID-19 Vaccine (1) Never done   Pneumonia Vaccine 44+ Years old (1 of 1 - PCV) Never done   DEXA SCAN  Never done   Diabetic kidney evaluation - Urine ACR  10/08/2022   Zoster Vaccines- Shingrix (1 of 2) 10/08/2022 (Originally 09/21/1969)   INFLUENZA VACCINE  10/31/2022 (Originally 03/02/2022)   HEMOGLOBIN A1C  01/08/2023   FOOT EXAM  05/19/2023   OPHTHALMOLOGY EXAM  06/05/2023    Diabetic kidney evaluation - eGFR measurement  07/10/2023   COLONOSCOPY (Pts 45-44yr Insurance coverage will need to be confirmed)  08/02/2023   MAMMOGRAM  09/02/2023   Medicare Annual Wellness (AWV)  09/30/2023   Hepatitis C Screening  Completed   HPV VACCINES  Aged Out   DTaP/Tdap/Td  Discontinued    Health Maintenance  Health Maintenance Due  Topic Date Due   COVID-19 Vaccine (1) Never done   Pneumonia Vaccine 73 Years old (1 of 1 - PCV) Never done   DEXA SCAN  Never done   Diabetic kidney evaluation - Urine ACR  10/08/2022    Colorectal cancer screening: Type of screening: Colonoscopy. Completed 08/01/13. Repeat every 10 years  Mammogram status: No longer required due to per pt .    Additional Screening:  Hepatitis C Screening:  Completed 04/05/19  Vision Screening: Recommended annual ophthalmology exams for early detection of glaucoma and other disorders of the eye. Is the patient up to date with their annual eye exam?  Yes  Who is the provider or what is the name of the office in which the patient attends annual eye exams? DR MAlanda Slim If pt is not established with a provider, would they like to be referred to a provider to establish care? No .   Dental Screening: Recommended annual dental exams for proper oral hygiene  Community Resource Referral / Chronic Care Management: CRR required this visit?  No   CCM required this visit?  No      Plan:     I have personally reviewed and noted the following in the patient's chart:   Medical and social history Use of alcohol, tobacco or illicit drugs  Current medications and supplements including opioid prescriptions. Patient is not currently taking opioid prescriptions. Functional ability and status Nutritional status Physical activity Advanced directives List of other physicians Hospitalizations, surgeries, and ER visits in previous 12 months Vitals Screenings to include cognitive, depression, and falls Referrals  and appointments  In addition, I have reviewed and discussed with patient certain preventive protocols, quality metrics, and best practice recommendations. A written personalized care plan for preventive services as well as general preventive health recommendations were provided to patient.     TWillette Brace LPN   [id number]  Nurse Notes: none

## 2022-10-07 DIAGNOSIS — M8580 Other specified disorders of bone density and structure, unspecified site: Secondary | ICD-10-CM | POA: Diagnosis not present

## 2022-10-07 DIAGNOSIS — E1165 Type 2 diabetes mellitus with hyperglycemia: Secondary | ICD-10-CM | POA: Diagnosis not present

## 2022-10-07 DIAGNOSIS — Z794 Long term (current) use of insulin: Secondary | ICD-10-CM | POA: Diagnosis not present

## 2022-10-07 DIAGNOSIS — I6930 Unspecified sequelae of cerebral infarction: Secondary | ICD-10-CM | POA: Diagnosis not present

## 2022-10-07 DIAGNOSIS — E1142 Type 2 diabetes mellitus with diabetic polyneuropathy: Secondary | ICD-10-CM | POA: Diagnosis not present

## 2022-10-07 LAB — LIPID PANEL
Cholesterol: 129 (ref 0–200)
HDL: 62 (ref 35–70)
LDL Cholesterol: 53
LDl/HDL Ratio: 2.1
Triglycerides: 65 (ref 40–160)

## 2022-10-07 LAB — PROTEIN / CREATININE RATIO, URINE: Creatinine, Urine: 63

## 2022-10-07 LAB — MICROALBUMIN, URINE: Microalb, Ur: <0.7

## 2022-10-07 LAB — MICROALBUMIN / CREATININE URINE RATIO: Microalb Creat Ratio: <11.1

## 2022-10-07 LAB — HEMOGLOBIN A1C: Hemoglobin A1C: 9.8

## 2022-11-12 ENCOUNTER — Telehealth: Payer: Self-pay | Admitting: Family Medicine

## 2022-11-12 MED ORDER — OMEPRAZOLE 20 MG PO CPDR: 20.0000 mg | DELAYED_RELEASE_CAPSULE | ORAL | 1 refills | Status: DC

## 2022-11-12 MED ORDER — ATORVASTATIN CALCIUM 40 MG PO TABS: ORAL_TABLET | ORAL | 1 refills | Status: DC

## 2022-11-12 NOTE — Telephone Encounter (Signed)
Patient is requesting medication refill on atorvastatin (LIPITOR) 40 MG tablet  and omeprazole (PRILOSEC) 20 MG capsule.   Both medications should be sent to   [facility] - [place], [place] - [id number]

## 2022-11-14 DIAGNOSIS — J45901 Unspecified asthma with (acute) exacerbation: Secondary | ICD-10-CM | POA: Diagnosis not present

## 2022-11-14 DIAGNOSIS — J019 Acute sinusitis, unspecified: Secondary | ICD-10-CM | POA: Diagnosis not present

## 2022-11-14 DIAGNOSIS — R051 Acute cough: Secondary | ICD-10-CM | POA: Diagnosis not present

## 2022-11-14 DIAGNOSIS — Z03818 Encounter for observation for suspected exposure to other biological agents ruled out: Secondary | ICD-10-CM | POA: Diagnosis not present

## 2022-12-23 DIAGNOSIS — L821 Other seborrheic keratosis: Secondary | ICD-10-CM | POA: Diagnosis not present

## 2022-12-23 DIAGNOSIS — D225 Melanocytic nevi of trunk: Secondary | ICD-10-CM | POA: Diagnosis not present

## 2022-12-23 DIAGNOSIS — L218 Other seborrheic dermatitis: Secondary | ICD-10-CM | POA: Diagnosis not present

## 2022-12-23 DIAGNOSIS — L853 Xerosis cutis: Secondary | ICD-10-CM | POA: Diagnosis not present

## 2022-12-23 DIAGNOSIS — Z08 Encounter for follow-up examination after completed treatment for malignant neoplasm: Secondary | ICD-10-CM | POA: Diagnosis not present

## 2022-12-23 DIAGNOSIS — L814 Other melanin hyperpigmentation: Secondary | ICD-10-CM | POA: Diagnosis not present

## 2022-12-23 DIAGNOSIS — Z85828 Personal history of other malignant neoplasm of skin: Secondary | ICD-10-CM | POA: Diagnosis not present

## 2022-12-23 DIAGNOSIS — Z09 Encounter for follow-up examination after completed treatment for conditions other than malignant neoplasm: Secondary | ICD-10-CM | POA: Diagnosis not present

## 2022-12-23 DIAGNOSIS — L570 Actinic keratosis: Secondary | ICD-10-CM | POA: Diagnosis not present

## 2023-01-04 DIAGNOSIS — M8580 Other specified disorders of bone density and structure, unspecified site: Secondary | ICD-10-CM | POA: Diagnosis not present

## 2023-01-04 DIAGNOSIS — E1142 Type 2 diabetes mellitus with diabetic polyneuropathy: Secondary | ICD-10-CM | POA: Diagnosis not present

## 2023-01-04 DIAGNOSIS — Z794 Long term (current) use of insulin: Secondary | ICD-10-CM | POA: Diagnosis not present

## 2023-01-04 DIAGNOSIS — E1165 Type 2 diabetes mellitus with hyperglycemia: Secondary | ICD-10-CM | POA: Diagnosis not present

## 2023-01-04 DIAGNOSIS — I6930 Unspecified sequelae of cerebral infarction: Secondary | ICD-10-CM | POA: Diagnosis not present

## 2023-01-07 LAB — HEMOGLOBIN A1C: Hemoglobin A1C: 9.9

## 2023-01-07 LAB — TSH: TSH: 2.03 (ref 0.41–5.90)

## 2023-01-11 ENCOUNTER — Ambulatory Visit: Payer: Medicare Other | Admitting: Family Medicine

## 2023-01-17 NOTE — Patient Instructions (Signed)

## 2023-01-18 ENCOUNTER — Encounter: Payer: Self-pay | Admitting: Family Medicine

## 2023-01-18 ENCOUNTER — Ambulatory Visit (INDEPENDENT_AMBULATORY_CARE_PROVIDER_SITE_OTHER): Payer: Medicare Other | Admitting: Family Medicine

## 2023-01-18 VITALS — BP 116/75 | HR 75 | Wt 142.6 lb

## 2023-01-18 DIAGNOSIS — E119 Type 2 diabetes mellitus without complications: Secondary | ICD-10-CM | POA: Diagnosis not present

## 2023-01-18 DIAGNOSIS — J019 Acute sinusitis, unspecified: Secondary | ICD-10-CM

## 2023-01-18 DIAGNOSIS — Z794 Long term (current) use of insulin: Secondary | ICD-10-CM | POA: Diagnosis not present

## 2023-01-18 DIAGNOSIS — E785 Hyperlipidemia, unspecified: Secondary | ICD-10-CM | POA: Diagnosis not present

## 2023-01-18 LAB — COMPREHENSIVE METABOLIC PANEL
ALT: 23 U/L (ref 0–35)
AST: 34 U/L (ref 0–37)
Albumin: 4.3 g/dL (ref 3.5–5.2)
Alkaline Phosphatase: 108 U/L (ref 39–117)
BUN: 7 mg/dL (ref 6–23)
CO2: 25 mEq/L (ref 19–32)
Calcium: 9.5 mg/dL (ref 8.4–10.5)
Chloride: 100 mEq/L (ref 96–112)
Creatinine, Ser: 0.91 mg/dL (ref 0.40–1.20)
GFR: 62.67 mL/min (ref >60.00)
Glucose, Bld: 191 mg/dL — ABNORMAL HIGH (ref 70–99)
Potassium: 4 mEq/L (ref 3.5–5.1)
Sodium: 135 mEq/L (ref 135–145)
Total Bilirubin: 0.7 mg/dL (ref 0.2–1.2)
Total Protein: 8.7 g/dL — ABNORMAL HIGH (ref 6.0–8.3)

## 2023-01-18 MED ORDER — AMOXICILLIN 875 MG PO TABS: 875.0000 mg | ORAL_TABLET | Freq: Two times a day (BID) | ORAL | 0 refills | Status: AC

## 2023-01-18 NOTE — Progress Notes (Signed)
OFFICE VISIT  01/18/2023  CC:  Chief Complaint  Patient presents with   Medical Management of Chronic Issues    Patient is a 72 y.o. female who presents for 60-month follow-up diabetes and hypercholesterolemia. She also has some sinusitis symptoms. A/P as of last visit: " Diabetes without complication, insulin-requiring. She is followed by Dr. Sharl Ma. In review of her sugars today it looks like she has consistent postprandial elevation, but pretty close to normal when fasting. Continue Tresiba 24 units a day and gradually increase Humalog to achieve 2-hour postprandial level of 140-160 consistently. Hemoglobin A1c today. Will forward results to Dr. Sharl Ma. (Of note, patient stopped Jardiance because she associated this medication with too much weight loss.  Additionally, she refuses to take GLP-1 agonist due to her fears of its effects on the gastrointestinal tract.)   2. hyperlipidemia, history of stroke. Continue atorvastatin 40 mg a day. Plan lipid panel when fasting in 6 months.  Hepatic panel today.   #3 GERD. She is doing well long-term on 20 mg omeprazole 3 times per week.  Refilled today.   4.  Preventative healthcare  vaccines: Shingrix->declines.  Tdap->declines.  Prevnar 20->declines. Breast cancer screening: Next mammogram due 08/2022. Colon cancer screening: Next colonoscopy due December 2024. Osteoporosis screening: needs DEXA-> she states she had bone density done in the remote past and it was normal. She declines further screening"  INTERIM HX: Wendy Mcclain is doing pretty well other than dealing with a lot of stress.  She has a nephew who has bad diabetes and she is caught up and trying to help him control this.  Her home glucoses are elevated, typically in the 150 to the 250 range.  She is apprehensive about her mealtime insulin in particular, history of some hypoglycemia has her hesitant to take much of this. She recently followed up with her endocrinologist, Dr. Sharl Ma, who  has instructed her to take 18 units of her Evaristo Bury every morning and she is experimenting with anywhere from 6 to 10 units of Humalog with breakfast and supper.  She does not typically eat significant meal at lunchtime.  She does have about a 2-week history of nasal congestion, ear pressure bilaterally, facial pressure and sinus headaches.  Slight/occasional cough, no wheezing or shortness of breath.  No fevers. She apparently had an asthma flare a couple of months ago and went to an urgent care and got a steroid injection.  ROS as above, plus--> no CPno dizziness, no rashes, no melena/hematochezia.  No polyuria or polydipsia.  No myalgias or arthralgias.  No focal weakness, paresthesias, or tremors.  No acute vision or hearing abnormalities.  No dysuria or unusual/new urinary urgency or frequency.  No recent changes in lower legs. No n/v/d or abd pain.  No palpitations.    Past Medical History:  Diagnosis Date   Allergic rhinitis 07/09/2013   Anxiety state 06/19/2018   Arrhythmia    Asthma 10/23/2018   Baker's cyst of knee, right 04/20/2017   Carpal tunnel syndrome of right wrist 10/05/2018   Cerebrovascular accident (CVA) (HCC)    09/2016: right caudate lacunar infarct secondary to small vessel disease source. L sided weakness   Diabetes mellitus (HCC) 11/23/2011   Sees Dr Sharl Ma, endocrinology   Diverticulosis    hx of 'itis   Elevated blood pressure (not hypertension) 07/09/2013   GERD (gastroesophageal reflux disease)    Herpes labialis 03/20/2014   Hyperlipidemia    Mood disorder (HCC) 11/23/2011   Neural foraminal stenosis of cervical spine  10/05/2018   Right Side    Orthostatic hypotension 09/18/2015   OSA (obstructive sleep apnea)    Paresthesia    Sleep disorder 07/09/2013   Vitamin D deficiency 05/20/2014    Past Surgical History:  Procedure Laterality Date   ABDOMINAL HYSTERECTOMY  08/03/1991   APPENDECTOMY  08/02/1961   BREAST ENHANCEMENT SURGERY  08/02/1984    COLONOSCOPY     07/2013 no polyps    Outpatient Medications Prior to Visit  Medication Sig Dispense Refill   albuterol (VENTOLIN HFA) 108 (90 Base) MCG/ACT inhaler Inhale 2 puffs into the lungs every 6 (six) hours as needed for wheezing or shortness of breath. 18 g 11   aspirin EC 81 MG tablet Take 1 tablet (81 mg total) by mouth daily. Swallow whole. 30 tablet 11   atorvastatin (LIPITOR) 40 MG tablet TAKE ONE TABLET BY MOUTH EVERY DAY AT 6PM 90 tablet 1   azelastine (ASTELIN) 0.1 % nasal spray Place 2 sprays into both nostrils 2 (two) times daily. Use in each nostril as directed 30 mL 12   cetirizine (ZYRTEC) 10 MG tablet TAKE 1 TABLET(10 MG) BY MOUTH DAILY 90 tablet 3   Cholecalciferol (VITAMIN D-3) 25 MCG (1000 UT) CAPS Take 1,000 Units by mouth daily.      Continuous Blood Gluc Sensor (FREESTYLE LIBRE 3 SENSOR) MISC apply to upper arm, change sensor every 14 days . change sensor every 14 days for 84 days     HUMALOG KWIKPEN 100 UNIT/ML KiwkPen Inject 10 Units into the skin 3 (three) times daily before meals.  0   insulin degludec (TRESIBA) 100 UNIT/ML FlexTouch Pen Inject 20 Units into the skin at bedtime.     Multiple Vitamin (MULTIVITAMIN) tablet Take 1 tablet by mouth See admin instructions. Twice weekly by mouth Monday and Wednesday     omeprazole (PRILOSEC) 20 MG capsule Take 1 capsule (20 mg total) by mouth 3 (three) times a week. 90 capsule 1   fluticasone (FLONASE) 50 MCG/ACT nasal spray Place 2 sprays into both nostrils daily. 16 g 6   No facility-administered medications prior to visit.    Allergies  Allergen Reactions   Canagliflozin Other (See Comments)   Dulaglutide Other (See Comments)   Influenza Vaccine Live     Other Reaction(s): fever, swollen arm   Metformin And Related Diarrhea    Review of Systems As per HPI  PE:    01/18/2023   11:22 AM 09/29/2022    9:58 AM 07/09/2022    1:55 PM  Vitals with BMI  Height   5\' 2"   Weight 142 lbs 10 oz 137 lbs 137 lbs 10  oz  BMI   25.16  Systolic 116  120  Diastolic 75  68  Pulse 75  75     Physical Exam  VS: noted--normal. Gen: alert, NAD, NONTOXIC APPEARING. HEENT: eyes without injection, drainage, or swelling.  Ears: EACs clear, TMs with normal light reflex and landmarks.  Nose: Clear rhinorrhea, with some dried, crusty exudate adherent to mildly injected mucosa.  No purulent d/c.  Bilateral paranasal sinus tenderness.  No facial swelling.  Throat and mouth without focal lesion.  No pharyngial swelling, erythema, or exudate.   Neck: supple, no LAD.   LUNGS: CTA bilat, nonlabored resps.   CV: RRR, no m/r/g. EXT: no c/c/e SKIN: no rash   LABS:  Last CBC Lab Results  Component Value Date   WBC 5.1 07/13/2019   HGB 11.9 (L) 07/13/2019   HCT 36.2  07/13/2019   MCV 91.6 07/13/2019   MCH 30.1 07/13/2019   RDW 12.9 07/13/2019   PLT 179 07/13/2019   Last metabolic panel Lab Results  Component Value Date   GLUCOSE 201 (H) 07/09/2022   NA 136 07/09/2022   K 4.0 07/09/2022   CL 99 07/09/2022   CO2 24 07/09/2022   BUN 10 07/09/2022   CREATININE 0.91 07/09/2022   EGFR 64 10/07/2021   CALCIUM 9.9 07/09/2022   PROT 8.2 (H) 07/09/2022   ALBUMIN 3.3 (L) 07/13/2019   BILITOT 0.4 07/09/2022   ALKPHOS 113 07/13/2019   AST 35 07/09/2022   ALT 25 07/09/2022   ANIONGAP 10 07/13/2019   Last lipids Lab Results  Component Value Date   CHOL 129 10/07/2022   HDL 62 10/07/2022   LDLCALC 53 10/07/2022   TRIG 65 10/07/2022   CHOLHDL 2.0 10/07/2021   Last hemoglobin A1c Lab Results  Component Value Date   HGBA1C 9.9 01/07/2023   Last thyroid functions Lab Results  Component Value Date   TSH 2.03 01/07/2023   Last vitamin D Lab Results  Component Value Date   VD25OH 30 03/26/2014   IMPRESSION AND PLAN:  #1 diabetes without complication, with current long-term use of insulin. Hba1c at Dr. Daune Perch office recently was 9.9 (2 wks ago). As noted in HPI, she is hesitant about insulin  use. She is managed by Dr. Sharl Ma and I defer any significant changes in management to him. We did discuss possibly just focusing on gradual increase of her Evaristo Bury until her fasting glucose is around the 100-110 range.  After that is achieved then she can focus more on fine-tuning her mealtime insulin. Checking electrolytes and kidney function today.  #2 hypercholesterolemia: Most recent lipid panel about 3 months ago--> total cholesterol 129, HDL 62, LDL 53, triglycerides 65. Continue atorvastatin 40 mg a day. Hepatic panel today.  #3 acute sinusitis. Amoxicillin 875 twice daily prescribed. Continue Astelin, Flonase, and Zyrtec.  #4 preventative healthcare  vaccines: Shingrix->declines.  Tdap->declines.  Prevnar 20->declines. Breast cancer screening: Next mammogram overdue as of 08/2022. Colon cancer screening: Next colonoscopy due December 2024. Osteoporosis screening: needs DEXA-> she states she had bone density done in the remote past and it was normal. She declines further screening  An After Visit Summary was printed and given to the patient.  FOLLOW UP: Return in about 6 months (around 07/20/2023) for routine chronic illness f/u.  Signed:  Santiago Bumpers, MD           01/18/2023

## 2023-04-12 DIAGNOSIS — M8580 Other specified disorders of bone density and structure, unspecified site: Secondary | ICD-10-CM | POA: Diagnosis not present

## 2023-04-12 DIAGNOSIS — E1165 Type 2 diabetes mellitus with hyperglycemia: Secondary | ICD-10-CM | POA: Diagnosis not present

## 2023-04-12 DIAGNOSIS — E1142 Type 2 diabetes mellitus with diabetic polyneuropathy: Secondary | ICD-10-CM | POA: Diagnosis not present

## 2023-04-12 DIAGNOSIS — I6930 Unspecified sequelae of cerebral infarction: Secondary | ICD-10-CM | POA: Diagnosis not present

## 2023-04-12 DIAGNOSIS — Z794 Long term (current) use of insulin: Secondary | ICD-10-CM | POA: Diagnosis not present

## 2023-04-21 ENCOUNTER — Other Ambulatory Visit: Payer: Self-pay | Admitting: Family Medicine

## 2023-04-21 NOTE — Telephone Encounter (Signed)
Last RF 11/18/22 (90,1)   RF too early

## 2023-04-21 NOTE — Telephone Encounter (Signed)
Error- please disregard

## 2023-05-13 DIAGNOSIS — J019 Acute sinusitis, unspecified: Secondary | ICD-10-CM | POA: Diagnosis not present

## 2023-05-30 ENCOUNTER — Telehealth: Payer: Self-pay

## 2023-05-30 MED ORDER — ATORVASTATIN CALCIUM 40 MG PO TABS: 40.0000 mg | ORAL_TABLET | Freq: Every day | ORAL | 3 refills | Status: DC

## 2023-05-30 NOTE — Telephone Encounter (Signed)
Prescription Request  05/30/2023  LOV: Visit date not found  What is the name of the medication or equipment?  atorvastatin (LIPITOR) 40 MG tablet  Have you contacted your pharmacy to request a refill? Yes - no record of rx - stated she sent in request over 1.5 weeks ago  Which pharmacy would you like this sent to?  [id number] - [place], [place] - [address] [place] [address] Phone: [phone] Fax: [phone]  [facility] - [place], [place] - [address] [place] [phone] Phone: [phone] Fax: [phone]    Patient notified that their request is being sent to the clinical staff for review and that they should receive a response within 2 business days.   Please advise at Mobile [phone] (mobile)

## 2023-06-19 ENCOUNTER — Encounter: Payer: Self-pay | Admitting: Cardiovascular Disease

## 2023-06-19 NOTE — Progress Notes (Unsigned)
Cardiology Office Note   Date:  06/21/2023   ID:  Wendy Mcclain, DOB 1950/08/04, MRN [id number]  PCP:  Jeoffrey Massed, MD  Cardiologist:   Kristeen Miss, MD   Chief Complaint  Mcclain presents with   orthostatic hypotension   Palpitations        Problem List 1. Heart murmur - trivial MR  2. Hx of syncope 3. Diabetes Mellitus  4.  CVA      Wendy Mcclain is a 72 y.o. female who presents for heart murmur. Has a remote hx of syncope.   Has seen Dr. Graciela Husbands and had a + tilt table test.  Has occasional CP and occasional lightheadedness  Has occasional CP - not necessarily associated with exertion .   No real exercise.  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  Eats a farily good diet.    May not drink enough water   Non smoker    Oct. 8, 2020   Wendy Mcclain is seen today for a follow-up visit.  I saw her several years ago for episodes of presyncope. Has occasional heart palpitations Wendy palpitations last for several minutes   - not associated with cp, dyspnea or pre syncope [place] 6 [place]  Has gained some weight   She has trivial mitral regurgitation by echocardiogram in 2018.  She has about needing to take antibiotics before having dental work.  She does NOT need SBE prophylaxis prior to dental work.  Sept. 19, 2023  Wendy Mcclain is still having some palpitations  Several times a month  Episodes only last for a few seconds   Nov. 19, 2024 Wendy Mcclain is seen for follow up of her palpitations,   had a stroke on 2018  HTN, HLD  Trivial MR   Has rare episodes of pain in her mid back  Rare, perhaps every 2-3 months  Does not get any regular exercise  But she does not have pain when she does walk       Past Medical History:  Diagnosis Date   Allergic rhinitis 07/09/2013   Anxiety state 06/19/2018   Arrhythmia    Asthma 10/23/2018   Baker's cyst of knee, right 04/20/2017   Carpal tunnel syndrome of right wrist  10/05/2018   Cerebrovascular accident (CVA) (HCC)    09/2016: right caudate lacunar infarct secondary to small vessel disease source. L sided weakness   Diabetes mellitus (HCC) 11/23/2011   Sees Dr Sharl Ma, endocrinology   Diverticulosis    hx of 'itis   Elevated blood pressure (not hypertension) 07/09/2013   GERD (gastroesophageal reflux disease)    Herpes labialis 03/20/2014   Hyperlipidemia    Mood disorder (HCC) 11/23/2011   Neural foraminal stenosis of cervical spine 10/05/2018   Right Side    Orthostatic hypotension 09/18/2015   OSA (obstructive sleep apnea)    Paresthesia    Sleep disorder 07/09/2013   Vitamin D deficiency 05/20/2014    Past Surgical History:  Procedure Laterality Date   ABDOMINAL HYSTERECTOMY  08/03/1991   APPENDECTOMY  08/02/1961   BREAST ENHANCEMENT SURGERY  08/02/1984   COLONOSCOPY     07/2013 no polyps     Current Outpatient Medications  Medication Sig Dispense Refill   albuterol (VENTOLIN HFA) 108 (90 Base) MCG/ACT inhaler Inhale 2 puffs into Wendy lungs every 6 (six) hours as needed for wheezing or shortness of breath. 18 g 11  aspirin EC 81 MG tablet Take 1 tablet (81 mg total) by mouth daily. Swallow whole. 30 tablet 11   atorvastatin (LIPITOR) 40 MG tablet Take 1 tablet (40 mg total) by mouth daily. 90 tablet 3   azelastine (ASTELIN) 0.1 % nasal spray Place 2 sprays into both nostrils 2 (two) times daily. Use in each nostril as directed 30 mL 12   cetirizine (ZYRTEC) 10 MG tablet TAKE 1 TABLET(10 MG) BY MOUTH DAILY 90 tablet 3   Cholecalciferol (VITAMIN D-3) 25 MCG (1000 UT) CAPS Take 1,000 Units by mouth daily.      Continuous Blood Gluc Sensor (FREESTYLE LIBRE 3 SENSOR) MISC apply to upper arm, change sensor every 14 days . change sensor every 14 days for 84 days     HUMALOG KWIKPEN 100 UNIT/ML KiwkPen Inject 20 Units into Wendy skin 3 (three) times daily before meals.  0   insulin degludec (TRESIBA) 100 UNIT/ML FlexTouch Pen Inject 18 Units into  Wendy skin at bedtime.     Multiple Vitamin (MULTIVITAMIN) tablet Take 1 tablet by mouth See admin instructions. Twice weekly by mouth Monday and Wednesday     omeprazole (PRILOSEC) 20 MG capsule Take 1 capsule (20 mg total) by mouth 3 (three) times a week. 90 capsule 1   atorvastatin (LIPITOR) 40 MG tablet TAKE ONE TABLET BY MOUTH EVERY DAY AT 6PM (Mcclain not taking: Reported on 06/21/2023) 90 tablet 1   No current facility-administered medications for this visit.    Allergies:   Canagliflozin, Dulaglutide, Influenza vaccine live, and Metformin and related    Social History:  Wendy Mcclain  reports that she has never smoked. She has never used smokeless tobacco. She reports that she does not drink alcohol and does not use drugs.   Family History:  Wendy Mcclain's family history includes Alzheimer's disease in her mother; Aortic aneurysm in her paternal aunt; Cancer in her father, paternal aunt, paternal grandfather, and sister; Coronary artery disease in her paternal grandfather; Crohn's disease in her sister; Diabetes in her brother and another family member; Heart disease in an other family member; Hyperlipidemia in her daughter and another family member; Hypertension in an other family member; Other in her sister; Squamous cell carcinoma in her brother; Stroke in an other family member; Throat cancer in her maternal grandfather; Thyroid disease in her daughter and sister.    ROS:  Please see Wendy history of present illness.    Physical Exam: Blood pressure 122/62, pulse 70, height 5\' 2"  (1.575 m), weight 146 lb 3.2 oz (66.3 kg), SpO2 97%.      GEN:  Well nourished, well developed in no acute distress HEENT: Normal NECK: No JVD; No carotid bruits LYMPHATICS: No lymphadenopathy CARDIAC: RRR , no murmurs, rubs, gallops RESPIRATORY:  Clear to auscultation without rales, wheezing or rhonchi  ABDOMEN: Soft, non-tender, non-distended MUSCULOSKELETAL:  No edema; No deformity  SKIN: Warm and  dry NEUROLOGIC:  Alert and oriented x 3   EKG:  EKG Interpretation Date/Time:  Tuesday June 21 2023 11:09:29 EST Ventricular Rate:  70 PR Interval:  214 QRS Duration:  76 QT Interval:  408 QTC Calculation: 440 R Axis:   18  Text Interpretation: Sinus rhythm with 1st degree A-V block Low voltage QRS Cannot rule out Anterior infarct , age undetermined When compared with ECG of 13-Jul-2019 11:20, No significant change since last tracing Confirmed by Kristeen Miss [phone]) on 06/21/2023 11:35:35 AM     Recent Labs: 01/07/2023: TSH 2.03 01/18/2023: ALT 23; BUN  7; Creatinine, Ser 0.91; Potassium 4.0; Sodium 135    Lipid Panel    Component Value Date/Time   CHOL 129 10/07/2022 0000   CHOL 127 10/07/2021 1148   TRIG 65 10/07/2022 0000   HDL 62 10/07/2022 0000   HDL 64 10/07/2021 1148   CHOLHDL 2.0 10/07/2021 1148   CHOLHDL 2.3 09/29/2018 0229   VLDL 12 09/29/2018 0229   LDLCALC 53 10/07/2022 0000   LDLCALC 47 10/07/2021 1148      Wt Readings from Last 3 Encounters:  06/21/23 146 lb 3.2 oz (66.3 kg)  01/18/23 142 lb 9.6 oz (64.7 kg)  09/29/22 137 lb (62.1 kg)      Other studies Reviewed: Additional studies/ records that were reviewed today include: . Review of Wendy above records demonstrates:    ASSESSMENT AND PLAN:  1.  Orthostatic hypotension:      No further episodes of orthostatic hypotension.  She seems to be doing well.  2. Heart murmur: -    No significant murmur heard on exam today.  3.  Palpitations:     Palpitations seem to be well-controlled.  4.  CVA :   -She does not have any significant residual CVA symptoms .Marland Kitchen  5.  Back  pain :   She has some intermittent back pain.  These do not occur with exertion and only last for a minute or so.  I suspect suspect it might be musculoskeletal.  Advised her to give [place] a call if she starts to develop exertional chest pain that is relieved with rest.  She will follow-up at [facility] in 1 year  with Dr. Duke Salvia.   Current medicines are reviewed at length with Wendy Mcclain today.  Wendy Mcclain does not have concerns regarding medicines.  Wendy following changes have been made:  no change  Labs/ tests ordered today include:   Orders Placed This Encounter  Procedures   EKG 12-Lead     Disposition:      Kristeen Miss, MD  06/21/2023 11:47 AM    [facility] [address] [place], [place], [place]  [address] Phone: [phone]; Fax: [phone]

## 2023-06-21 ENCOUNTER — Encounter: Payer: Self-pay | Admitting: Cardiovascular Disease

## 2023-06-21 ENCOUNTER — Ambulatory Visit: Payer: Medicare Other | Attending: Cardiovascular Disease | Admitting: Cardiovascular Disease

## 2023-06-21 VITALS — BP 122/62 | HR 70 | Ht 62.0 in | Wt 146.2 lb

## 2023-06-21 DIAGNOSIS — Z09 Encounter for follow-up examination after completed treatment for conditions other than malignant neoplasm: Secondary | ICD-10-CM | POA: Insufficient documentation

## 2023-06-21 DIAGNOSIS — R002 Palpitations: Secondary | ICD-10-CM | POA: Diagnosis not present

## 2023-06-21 DIAGNOSIS — I10 Essential (primary) hypertension: Secondary | ICD-10-CM | POA: Diagnosis not present

## 2023-06-21 NOTE — Patient Instructions (Signed)
Follow-Up: At [facility], you and your health needs are our priority.  As part of our continuing mission to provide you with exceptional heart care, we have created designated Provider Care Teams.  These Care Teams include your primary Cardiologist (physician) and Advanced Practice Providers (APPs -  Physician Assistants and Nurse Practitioners) who all work together to provide you with the care you need, when you need it.  Your next appointment:   1 year(s)  Provider:   Chilton Si, MD

## 2023-07-12 DIAGNOSIS — J019 Acute sinusitis, unspecified: Secondary | ICD-10-CM | POA: Diagnosis not present

## 2023-07-12 DIAGNOSIS — M8580 Other specified disorders of bone density and structure, unspecified site: Secondary | ICD-10-CM | POA: Diagnosis not present

## 2023-07-12 DIAGNOSIS — Z794 Long term (current) use of insulin: Secondary | ICD-10-CM | POA: Diagnosis not present

## 2023-07-12 DIAGNOSIS — R051 Acute cough: Secondary | ICD-10-CM | POA: Diagnosis not present

## 2023-07-12 DIAGNOSIS — E1142 Type 2 diabetes mellitus with diabetic polyneuropathy: Secondary | ICD-10-CM | POA: Diagnosis not present

## 2023-07-12 DIAGNOSIS — J00 Acute nasopharyngitis [common cold]: Secondary | ICD-10-CM | POA: Diagnosis not present

## 2023-07-12 DIAGNOSIS — H9203 Otalgia, bilateral: Secondary | ICD-10-CM | POA: Diagnosis not present

## 2023-07-12 DIAGNOSIS — I6930 Unspecified sequelae of cerebral infarction: Secondary | ICD-10-CM | POA: Diagnosis not present

## 2023-07-12 DIAGNOSIS — E1165 Type 2 diabetes mellitus with hyperglycemia: Secondary | ICD-10-CM | POA: Diagnosis not present

## 2023-07-20 ENCOUNTER — Encounter: Payer: Self-pay | Admitting: Family Medicine

## 2023-07-20 ENCOUNTER — Ambulatory Visit (INDEPENDENT_AMBULATORY_CARE_PROVIDER_SITE_OTHER): Payer: Medicare Other | Admitting: Family Medicine

## 2023-07-20 ENCOUNTER — Ambulatory Visit: Payer: Medicare Other | Admitting: Family Medicine

## 2023-07-20 ENCOUNTER — Telehealth: Payer: Medicare Other | Admitting: Physician Assistant

## 2023-07-20 VITALS — BP 120/75 | Wt 144.5 lb

## 2023-07-20 DIAGNOSIS — B9789 Other viral agents as the cause of diseases classified elsewhere: Secondary | ICD-10-CM

## 2023-07-20 DIAGNOSIS — R0981 Nasal congestion: Secondary | ICD-10-CM

## 2023-07-20 DIAGNOSIS — J019 Acute sinusitis, unspecified: Secondary | ICD-10-CM | POA: Diagnosis not present

## 2023-07-20 MED ORDER — IPRATROPIUM BROMIDE 0.03 % NA SOLN: 2.0000 | Freq: Two times a day (BID) | NASAL | 0 refills | Status: AC

## 2023-07-20 NOTE — Progress Notes (Signed)
I have spent 5 minutes in review of e-visit questionnaire, review and updating patient chart, medical decision making and response to patient.   Mia Milan Cody Jacklynn Dehaas, PA-C    

## 2023-07-20 NOTE — Progress Notes (Signed)
E-Visit for Sinus Problems  We are sorry that you are not feeling well.  Here is how we plan to help!  Based on what you have shared with me it looks like you have sinusitis.  Sinusitis is inflammation and infection in the sinus cavities of the head.  Based on your presentation I believe you most likely have Acute Viral Sinusitis.This is an infection most likely caused by a virus. There is not specific treatment for viral sinusitis other than to help you with the symptoms until the infection runs its course.  You may use an oral decongestant such as Mucinex D or if you have glaucoma or high blood pressure use plain Mucinex. Saline nasal spray help and can safely be used as often as needed for congestion, I have prescribed: Ipratropium Bromide nasal spray 0.03% 2 sprays in eah nostril 2-3 times a day  Some authorities believe that zinc sprays or the use of Echinacea may shorten the course of your symptoms.  Sinus infections are not as easily transmitted as other respiratory infection, however we still recommend that you avoid close contact with loved ones, especially the very young and elderly.  Remember to wash your hands thoroughly throughout the day as this is the number one way to prevent the spread of infection!  Home Care: Only take medications as instructed by your medical team. Do not take these medications with alcohol. A steam or ultrasonic humidifier can help congestion.  You can place a towel over your head and breathe in the steam from hot water coming from a faucet. Avoid close contacts especially the very young and the elderly. Cover your mouth when you cough or sneeze. Always remember to wash your hands.  Get Help Right Away If: You develop worsening fever or sinus pain. You develop a severe head ache or visual changes. Your symptoms persist after you have completed your treatment plan.  Make sure you Understand these instructions. Will watch your condition. Will get help  right away if you are not doing well or get worse.   Thank you for choosing an e-visit.  Your e-visit answers were reviewed by a board certified advanced clinical practitioner to complete your personal care plan. Depending upon the condition, your plan could have included both over the counter or prescription medications.  Please review your pharmacy choice. Make sure the pharmacy is open so you can pick up prescription now. If there is a problem, you may contact your provider through MyChart messaging and have the prescription routed to another pharmacy.  Your safety is important to us. If you have drug allergies check your prescription carefully.   For the next 24 hours you can use MyChart to ask questions about today's visit, request a non-urgent call back, or ask for a work or school excuse. You will get an email in the next two days asking about your experience. I hope that your e-visit has been valuable and will speed your recovery.   

## 2023-07-20 NOTE — Progress Notes (Signed)
     Pt unable to connect to virtual platform.

## 2023-07-21 ENCOUNTER — Other Ambulatory Visit (HOSPITAL_COMMUNITY): Payer: Self-pay

## 2023-07-21 ENCOUNTER — Telehealth: Payer: Medicare Other | Admitting: Physician Assistant

## 2023-07-21 ENCOUNTER — Telehealth: Payer: Medicare Other | Admitting: Family Medicine

## 2023-07-21 DIAGNOSIS — U071 COVID-19: Secondary | ICD-10-CM | POA: Diagnosis not present

## 2023-07-21 MED ORDER — NIRMATRELVIR/RITONAVIR (PAXLOVID)TABLET: 3.0000 | ORAL_TABLET | Freq: Two times a day (BID) | ORAL | 0 refills | Status: AC

## 2023-07-21 NOTE — Telephone Encounter (Signed)
Pt scheduled for 4p virtual

## 2023-07-21 NOTE — Telephone Encounter (Signed)
Pt made aware

## 2023-07-21 NOTE — Telephone Encounter (Signed)
Tell her I am sorry she is feeling bad but antibiotics would not help her.   She has a viral infection.  The only treatment for the virus is Paxlovid. Reassure her that it is okay if she does not take Paxlovid, though. Take the over-the-counter cough medication Mucinex DM. Drink plenty of fluids and take Tylenol every 6 hours as needed for body aches or fever. Nothing further to do at this time.

## 2023-07-21 NOTE — Progress Notes (Signed)
Virtual Visit Consent   Wendy Mcclain, you are scheduled for a virtual visit with a [facility] provider today. Just as with appointments in the office, your consent must be obtained to participate. Your consent will be active for this visit and any virtual visit you may have with one of our providers in the next 365 days. If you have a MyChart account, a copy of this consent can be sent to you electronically.  As this is a virtual visit, video technology does not allow for your provider to perform a traditional examination. This may limit your provider's ability to fully assess your condition. If your provider identifies any concerns that need to be evaluated in person or the need to arrange testing (such as labs, EKG, etc.), we will make arrangements to do so. Although advances in technology are sophisticated, we cannot ensure that it will always work on either your end or our end. If the connection with a video visit is poor, the visit may have to be switched to a telephone visit. With either a video or telephone visit, we are not always able to ensure that we have a secure connection.  By engaging in this virtual visit, you consent to the provision of healthcare and authorize for your insurance to be billed (if applicable) for the services provided during this visit. Depending on your insurance coverage, you may receive a charge related to this service.  I need to obtain your verbal consent now. Are you willing to proceed with your visit today? KALIANNAH Mcclain has provided verbal consent on 07/21/2023 for a virtual visit (video or telephone). Wendy Mcclain, [place]  Date: 07/21/2023 8:17 AM  Virtual Visit via Video Note   I, Wendy Mcclain, connected with  Wendy Mcclain  ([id number], 1951/04/21) on 07/21/23 at  8:00 AM EST by a video-enabled telemedicine application and verified that I am speaking with the correct person using two identifiers.  Location: Patient: Virtual Visit Location Patient:  Home Provider: Virtual Visit Location Provider: Home Office   I discussed the limitations of evaluation and management by telemedicine and the availability of in person appointments. The patient expressed understanding and agreed to proceed.    History of Present Illness: Wendy Mcclain is a 72 y.o. who identifies as a female who was assigned female at birth, and is being seen today for viral illness.  HPI: 72 y/o F presents via telehealth video visit for fever, cough, congestion, and body aches x 1 day. Earlier today she tested positive for covid at home.     Problems:  Patient Active Problem List   Diagnosis Date Noted   Actinic keratoses 01/16/2020   Grade 1 ankle sprain, right, initial encounter 04/07/2019   Asthma 10/23/2018   Bronchospasm, acute 10/20/2018   Neural foraminal stenosis of cervical spine 10/05/2018   Carpal tunnel syndrome of right wrist 10/05/2018   Right arm weakness 09/29/2018   Paresthesia    Arm weakness 09/28/2018   Anxiety state 06/19/2018   Tension headache 04/20/2017   Baker's cyst of knee, right 04/20/2017   Grief reaction 04/20/2017   Cerebrovascular accident (CVA) (HCC)    Hyperlipidemia    OSA (obstructive sleep apnea)    Gastro-esophageal reflux 03/20/2014   Allergic rhinitis 07/09/2013   Sleep disorder 07/09/2013   Elevated blood pressure reading without diagnosis of hypertension 07/09/2013   Mood disorder (HCC) 11/23/2011   Diabetes mellitus (HCC) 11/23/2011   Health care maintenance 11/23/2011    Allergies:  Allergies  Allergen Reactions   Canagliflozin Other (See Comments)   Dulaglutide Other (See Comments)   Influenza Vaccine Live     Other Reaction(s): fever, swollen arm   Metformin And Related Diarrhea   Medications:  Current Outpatient Medications:    nirmatrelvir/ritonavir (PAXLOVID) 20 x 150 MG & 10 x 100MG  TABS, Take 3 tablets by mouth 2 (two) times daily for 5 days. (Take nirmatrelvir 150 mg two tablets twice daily for 5  days and ritonavir 100 mg one tablet twice daily for 5 days) Patient GFR is 62 mL/min, Disp: 30 tablet, Rfl: 0   albuterol (VENTOLIN HFA) 108 (90 Base) MCG/ACT inhaler, Inhale 2 puffs into the lungs every 6 (six) hours as needed for wheezing or shortness of breath., Disp: 18 g, Rfl: 11   aspirin EC 81 MG tablet, Take 1 tablet (81 mg total) by mouth daily. Swallow whole., Disp: 30 tablet, Rfl: 11   atorvastatin (LIPITOR) 40 MG tablet, TAKE ONE TABLET BY MOUTH EVERY DAY AT 6PM (Patient not taking: Reported on 07/20/2023), Disp: 90 tablet, Rfl: 1   atorvastatin (LIPITOR) 40 MG tablet, Take 1 tablet (40 mg total) by mouth daily., Disp: 90 tablet, Rfl: 3   azelastine (ASTELIN) 0.1 % nasal spray, Place 2 sprays into both nostrils 2 (two) times daily. Use in each nostril as directed, Disp: 30 mL, Rfl: 12   cetirizine (ZYRTEC) 10 MG tablet, TAKE 1 TABLET(10 MG) BY MOUTH DAILY, Disp: 90 tablet, Rfl: 3   Cholecalciferol (VITAMIN D-3) 25 MCG (1000 UT) CAPS, Take 1,000 Units by mouth daily. , Disp: , Rfl:    Continuous Blood Gluc Sensor (FREESTYLE LIBRE 3 SENSOR) MISC, apply to upper arm, change sensor every 14 days . change sensor every 14 days for 84 days, Disp: , Rfl:    HUMALOG KWIKPEN 100 UNIT/ML KiwkPen, Inject 20 Units into the skin 3 (three) times daily before meals., Disp: , Rfl: 0   insulin degludec (TRESIBA) 100 UNIT/ML FlexTouch Pen, Inject 18 Units into the skin at bedtime., Disp: , Rfl:    ipratropium (ATROVENT) 0.03 % nasal spray, Place 2 sprays into both nostrils every 12 (twelve) hours., Disp: 30 mL, Rfl: 0   Multiple Vitamin (MULTIVITAMIN) tablet, Take 1 tablet by mouth See admin instructions. Twice weekly by mouth Monday and Wednesday, Disp: , Rfl:    omeprazole (PRILOSEC) 20 MG capsule, Take 1 capsule (20 mg total) by mouth 3 (three) times a week., Disp: 90 capsule, Rfl: 1  Observations/Objective: Patient is well-developed, well-nourished in no acute distress.  Resting comfortably  at home.   Head is normocephalic, atraumatic.  No labored breathing.  Speech is clear and coherent with logical content.  Patient is alert and oriented at baseline.    Assessment and Plan: 1. COVID-19 virus infection (Primary) - nirmatrelvir/ritonavir (PAXLOVID) 20 x 150 MG & 10 x 100MG  TABS; Take 3 tablets by mouth 2 (two) times daily for 5 days. (Take nirmatrelvir 150 mg two tablets twice daily for 5 days and ritonavir 100 mg one tablet twice daily for 5 days) Patient GFR is 62 mL/min  Dispense: 30 tablet; Refill: 0  Increase fluids Take otc multi-symptom medicine for your symptoms.  Take Mucinex for cough. Start medicine as prescribed. Continue to watch for worsening symptoms then go to Urgent Care or ER for further evaluation.  Pt verbalized understanding and in agreement.    Follow Up Instructions: I discussed the assessment and treatment plan with the patient. The patient was provided an opportunity to ask  questions and all were answered. The patient agreed with the plan and demonstrated an understanding of the instructions.  A copy of instructions were sent to the patient via MyChart unless otherwise noted below.   Patient has requested to receive PHI (AVS, Work Notes, etc) pertaining to this video visit through e-mail as they are currently without active MyChart. They have voiced understand that email is not considered secure and their health information could be viewed by someone other than the patient.   The patient was advised to call back or seek an in-person evaluation if the symptoms worsen or if the condition fails to improve as anticipated.    Wendy Better, PA-C

## 2023-07-21 NOTE — Patient Instructions (Signed)
Wendy Mcclain, thank you for joining Gilberto Better, PA-C for today's virtual visit.  While this provider is not your primary care provider (PCP), if your PCP is located in our provider database this encounter information will be shared with them immediately following your visit.   A [facility] MyChart account gives you access to today's visit and all your visits, tests, and labs performed at [facility] " click here if you don't have a [facility] MyChart account or go to [email or web address]  Consent: (Patient) Wendy Mcclain provided verbal consent for this virtual visit at the beginning of the encounter.  Current Medications:  Current Outpatient Medications:    nirmatrelvir/ritonavir (PAXLOVID) 20 x 150 MG & 10 x 100MG  TABS, Take 3 tablets by mouth 2 (two) times daily for 5 days. (Take nirmatrelvir 150 mg two tablets twice daily for 5 days and ritonavir 100 mg one tablet twice daily for 5 days) Patient GFR is 62 mL/min, Disp: 30 tablet, Rfl: 0   albuterol (VENTOLIN HFA) 108 (90 Base) MCG/ACT inhaler, Inhale 2 puffs into the lungs every 6 (six) hours as needed for wheezing or shortness of breath., Disp: 18 g, Rfl: 11   aspirin EC 81 MG tablet, Take 1 tablet (81 mg total) by mouth daily. Swallow whole., Disp: 30 tablet, Rfl: 11   atorvastatin (LIPITOR) 40 MG tablet, TAKE ONE TABLET BY MOUTH EVERY DAY AT 6PM (Patient not taking: Reported on 07/20/2023), Disp: 90 tablet, Rfl: 1   atorvastatin (LIPITOR) 40 MG tablet, Take 1 tablet (40 mg total) by mouth daily., Disp: 90 tablet, Rfl: 3   azelastine (ASTELIN) 0.1 % nasal spray, Place 2 sprays into both nostrils 2 (two) times daily. Use in each nostril as directed, Disp: 30 mL, Rfl: 12   cetirizine (ZYRTEC) 10 MG tablet, TAKE 1 TABLET(10 MG) BY MOUTH DAILY, Disp: 90 tablet, Rfl: 3   Cholecalciferol (VITAMIN D-3) 25 MCG (1000 UT) CAPS, Take 1,000 Units by mouth daily. , Disp: , Rfl:    Continuous Blood Gluc Sensor (FREESTYLE LIBRE 3  SENSOR) MISC, apply to upper arm, change sensor every 14 days . change sensor every 14 days for 84 days, Disp: , Rfl:    HUMALOG KWIKPEN 100 UNIT/ML KiwkPen, Inject 20 Units into the skin 3 (three) times daily before meals., Disp: , Rfl: 0   insulin degludec (TRESIBA) 100 UNIT/ML FlexTouch Pen, Inject 18 Units into the skin at bedtime., Disp: , Rfl:    ipratropium (ATROVENT) 0.03 % nasal spray, Place 2 sprays into both nostrils every 12 (twelve) hours., Disp: 30 mL, Rfl: 0   Multiple Vitamin (MULTIVITAMIN) tablet, Take 1 tablet by mouth See admin instructions. Twice weekly by mouth Monday and Wednesday, Disp: , Rfl:    omeprazole (PRILOSEC) 20 MG capsule, Take 1 capsule (20 mg total) by mouth 3 (three) times a week., Disp: 90 capsule, Rfl: 1   Medications ordered in this encounter:  Meds ordered this encounter  Medications   nirmatrelvir/ritonavir (PAXLOVID) 20 x 150 MG & 10 x 100MG  TABS    Sig: Take 3 tablets by mouth 2 (two) times daily for 5 days. (Take nirmatrelvir 150 mg two tablets twice daily for 5 days and ritonavir 100 mg one tablet twice daily for 5 days) Patient GFR is 62 mL/min    Dispense:  30 tablet    Refill:  0    Supervising Provider:   Merrilee Jansky [[id number]]     *If you need refills on other medications prior  to your next appointment, please contact your pharmacy*  Follow-Up: Call back or seek an in-person evaluation if the symptoms worsen or if the condition fails to improve as anticipated.  [facility] [phone]  Other Instructions Increase fluids Take otc multi-symptom medicine for your symptoms.  Take Mucinex for cough. Start medicine as prescribed. Continue to watch for worsening symptoms then go to Urgent Care or ER for further evaluation.    If you have been instructed to have an in-person evaluation today at a local Urgent Care facility, please use the link below. It will take you to a list of all of our available [facility], including address, phone number and hours of operation. Please do not delay care.  [facility]  If you or a family member do not have a primary care provider, use the link below to schedule a visit and establish care. When you choose a [facility] primary care physician or advanced practice provider, you gain a long-term partner in health. Find a Primary Care Provider  Learn more about [facility] in-office and virtual care options:  - Get Care Now

## 2023-07-21 NOTE — Telephone Encounter (Signed)
Pt is wanting to see if she can get an antibiotic prescribed to her because she can not afford the paxlovid. Please advise

## 2023-07-25 ENCOUNTER — Encounter: Payer: Self-pay | Admitting: Internal Medicine

## 2023-07-25 LAB — HEMOGLOBIN A1C: Hemoglobin A1C: 9.6

## 2023-07-28 ENCOUNTER — Encounter: Payer: Self-pay | Admitting: Family Medicine

## 2023-07-28 ENCOUNTER — Telehealth: Payer: Medicare Other | Admitting: Family Medicine

## 2023-07-28 VITALS — HR 77 | Temp 99.2°F

## 2023-07-28 DIAGNOSIS — U071 COVID-19: Secondary | ICD-10-CM

## 2023-07-28 DIAGNOSIS — J069 Acute upper respiratory infection, unspecified: Secondary | ICD-10-CM

## 2023-07-28 DIAGNOSIS — J189 Pneumonia, unspecified organism: Secondary | ICD-10-CM | POA: Diagnosis not present

## 2023-07-28 MED ORDER — HYDROCODONE BIT-HOMATROP MBR 5-1.5 MG/5ML PO SOLN: ORAL | 0 refills | Status: DC

## 2023-07-28 MED ORDER — DOXYCYCLINE HYCLATE 100 MG PO CAPS: 100.0000 mg | ORAL_CAPSULE | Freq: Two times a day (BID) | ORAL | 0 refills | Status: AC

## 2023-07-28 NOTE — Progress Notes (Signed)
Virtual Visit via Video Note  I connected with Wendy Mcclain  on 07/28/23 at  3:20 PM EST by a video enabled telemedicine application and verified that I am speaking with the correct person using two identifiers.  Location patient: [place] Location provider:work or home office Persons participating in the virtual visit: patient, provider  I discussed the limitations and requested verbal permission for telemedicine visit. The patient expressed understanding and agreed to proceed.   HPI: 72 year old female being seen today for recent respiratory symptoms. Onset 9 days ago of nasal congestion/runny nose, postnasal drip, headache, fatigue, and cough. Not long after the onset she was diagnosed with COVID and was prescribed Paxlovid, which she has now finished.  She tested for COVID again yesterday at home and it was negative. She continues to have all the same symptoms.  Denies shortness of breath, chest pain, or chest tightness.  She has had a subjective fever within the last 24 to 48 hours. She is taking Tylenol and has had some hydrocodone cough syrup that she had leftover from a previous respiratory illness.  Glucoses have been higher than usual lately, which often happens when she gets sick.   ROS: See pertinent positives and negatives per HPI.  Past Medical History:  Diagnosis Date   Allergic rhinitis 07/09/2013   Anxiety state 06/19/2018   Arrhythmia    Asthma 10/23/2018   Baker's cyst of knee, right 04/20/2017   Carpal tunnel syndrome of right wrist 10/05/2018   Cerebrovascular accident (CVA) (HCC)    09/2016: right caudate lacunar infarct secondary to small vessel disease source. L sided weakness   Diabetes mellitus (HCC) 11/23/2011   Sees Dr Sharl Ma, endocrinology   Diverticulosis    hx of 'itis   Elevated blood pressure (not hypertension) 07/09/2013   GERD (gastroesophageal reflux disease)    Herpes labialis 03/20/2014   Hyperlipidemia    Mood disorder (HCC) 11/23/2011   Neural  foraminal stenosis of cervical spine 10/05/2018   Right Side    Orthostatic hypotension 09/18/2015   OSA (obstructive sleep apnea)    Paresthesia    Sleep disorder 07/09/2013   Vitamin D deficiency 05/20/2014    Past Surgical History:  Procedure Laterality Date   ABDOMINAL HYSTERECTOMY  08/03/1991   APPENDECTOMY  08/02/1961   BREAST ENHANCEMENT SURGERY  08/02/1984   COLONOSCOPY     07/2013 no polyps    Current Outpatient Medications:    albuterol (VENTOLIN HFA) 108 (90 Base) MCG/ACT inhaler, Inhale 2 puffs into the lungs every 6 (six) hours as needed for wheezing or shortness of breath., Disp: 18 g, Rfl: 11   aspirin EC 81 MG tablet, Take 1 tablet (81 mg total) by mouth daily. Swallow whole., Disp: 30 tablet, Rfl: 11   atorvastatin (LIPITOR) 40 MG tablet, Take 1 tablet (40 mg total) by mouth daily., Disp: 90 tablet, Rfl: 3   azelastine (ASTELIN) 0.1 % nasal spray, Place 2 sprays into both nostrils 2 (two) times daily. Use in each nostril as directed, Disp: 30 mL, Rfl: 12   cetirizine (ZYRTEC) 10 MG tablet, TAKE 1 TABLET(10 MG) BY MOUTH DAILY, Disp: 90 tablet, Rfl: 3   Cholecalciferol (VITAMIN D-3) 25 MCG (1000 UT) CAPS, Take 1,000 Units by mouth daily. , Disp: , Rfl:    Continuous Blood Gluc Sensor (FREESTYLE LIBRE 3 SENSOR) MISC, apply to upper arm, change sensor every 14 days . change sensor every 14 days for 84 days, Disp: , Rfl:    doxycycline (VIBRAMYCIN) 100 MG capsule, Take 1  capsule (100 mg total) by mouth 2 (two) times daily for 7 days., Disp: 14 capsule, Rfl: 0   HUMALOG KWIKPEN 100 UNIT/ML KiwkPen, Inject 20 Units into the skin 3 (three) times daily before meals., Disp: , Rfl: 0   HYDROcodone bit-homatropine (HYCODAN) 5-1.5 MG/5ML syrup, 1-2 tsp po bid prn cough, Disp: 120 mL, Rfl: 0   insulin degludec (TRESIBA) 100 UNIT/ML FlexTouch Pen, Inject 18 Units into the skin at bedtime., Disp: , Rfl:    ipratropium (ATROVENT) 0.03 % nasal spray, Place 2 sprays into both nostrils  every 12 (twelve) hours., Disp: 30 mL, Rfl: 0   Multiple Vitamin (MULTIVITAMIN) tablet, Take 1 tablet by mouth See admin instructions. Twice weekly by mouth Monday and Wednesday, Disp: , Rfl:    omeprazole (PRILOSEC) 20 MG capsule, Take 1 capsule (20 mg total) by mouth 3 (three) times a week., Disp: 90 capsule, Rfl: 1   atorvastatin (LIPITOR) 40 MG tablet, TAKE ONE TABLET BY MOUTH EVERY DAY AT 6PM (Patient not taking: Reported on 07/20/2023), Disp: 90 tablet, Rfl: 1  EXAM:  VITALS per patient if applicable:     07/28/2023    3:12 PM 07/20/2023    2:26 PM 06/21/2023   11:05 AM  Vitals with BMI  Height   5\' 2"   Weight  144 lbs 8 oz 146 lbs 3 oz  BMI   26.73  Systolic  120 122  Diastolic  75 62  Pulse 77  70    GENERAL: alert, oriented, appears well and in no acute distress  HEENT: atraumatic, conjunttiva clear, no obvious abnormalities on inspection of external nose and ears  NECK: normal movements of the head and neck  LUNGS: on inspection no signs of respiratory distress, breathing rate appears normal, no obvious gross SOB, gasping or wheezing  CV: no obvious cyanosis  MS: moves all visible extremities without noticeable abnormality  PSYCH/NEURO: pleasant and cooperative, no obvious depression or anxiety, speech and thought processing grossly intact  ASSESSMENT AND PLAN:  Discussed the following assessment and plan:  COVID-19 upper and lower respiratory tract infection. Symptoms minimally improved with Paxlovid. Will treat for possible community-acquired pneumonia. Doxycycline 100 mg twice daily x 7 days. Hycodan syrup, 1 to 2 teaspoon twice daily as needed, 120 mL. Signs/symptoms to call or return for were reviewed and pt expressed understanding.  I discussed the assessment and treatment plan with the patient. The patient was provided an opportunity to ask questions and all were answered. The patient agreed with the plan and demonstrated an understanding of the  instructions.   F/u: Call or return if not improving in 4 to 5 days.  Has [facility] visit set for 08/11/23  Signed:  Santiago Bumpers, MD           07/28/2023

## 2023-08-11 ENCOUNTER — Ambulatory Visit: Payer: Medicare Other | Admitting: Family Medicine

## 2023-08-22 ENCOUNTER — Ambulatory Visit: Payer: Medicare Other | Admitting: Family Medicine

## 2023-09-01 ENCOUNTER — Ambulatory Visit: Payer: Self-pay | Admitting: Family Medicine

## 2023-09-01 NOTE — Telephone Encounter (Signed)
Summary: Nasal Congestion/Cough   Copied From CRM [id number]. Reason for Triage: Patient had virtual visit with Dr. Milinda Cave last month - says she's still having the cough and nasal congestion     Attempt made to contact pt: no answer: left voicemail.

## 2023-09-01 NOTE — Telephone Encounter (Signed)
  Chief Complaint: Cough Symptoms: Cough, nasal congestion Frequency: Ongoing since she had Covid in December Pertinent Negatives: Patient denies SOB, fever Disposition: [] ED /[] Urgent Care (no appt availability in office) / [x] Appointment(In office/virtual)/ []  [facility]/ [] Home Care/ [] Refused Recommended Disposition /[] [facility]/ []  Follow-up with PCP Additional Notes: Pt reports she has had persistent non-productive cough since she had COVID in December. Pt reports it is intermittent in its intensity but she has not been able to resolve it. Pt notes nasal/sinus congestion and "fullness" in her ears. Pt has been using OTC Nyquil and prescription cough syrup from her last visit. Pt denies fever, SOB. OV scheduled tomorrow AM. This RN educated pt on home care, new-worsening symptoms, when to call back/seek emergent care. Pt verbalized understanding and agrees to plan.    Reason for Disposition  Earache is present  Answer Assessment - Initial Assessment Questions 1. ONSET: "When did the cough begin?"      Covid in December, ongoing since then 3. SPUTUM: "Describe the color of your sputum" (none, dry cough; clear, white, yellow, green)     None 4. HEMOPTYSIS: "Are you coughing up any blood?" If so ask: "How much?" (flecks, streaks, tablespoons, etc.)     None 5. DIFFICULTY BREATHING: "Are you having difficulty breathing?" If Yes, ask: "How bad is it?" (e.g., mild, moderate, severe)    - MILD: No SOB at rest, mild SOB with walking, speaks normally in sentences, can lie down, no retractions, pulse < 100.    - MODERATE: SOB at rest, SOB with minimal exertion and prefers to sit, cannot lie down flat, speaks in phrases, mild retractions, audible wheezing, pulse 100-120.    - SEVERE: Very SOB at rest, speaks in single words, struggling to breathe, sitting hunched forward, retractions, pulse > 120      None 6. FEVER: "Do you have a fever?" If Yes, ask: "What is your  temperature, how was it measured, and when did it start?"     "Once in a while I feel really hot" 10. OTHER SYMPTOMS: "Do you have any other symptoms?" (e.g., runny nose, wheezing, chest pain)       Nasal congestion, ears  Protocols used: Cough - Acute Non-Productive-A-AH

## 2023-09-02 ENCOUNTER — Encounter: Payer: Self-pay | Admitting: Family Medicine

## 2023-09-02 ENCOUNTER — Ambulatory Visit (INDEPENDENT_AMBULATORY_CARE_PROVIDER_SITE_OTHER): Payer: Medicare Other | Admitting: Family Medicine

## 2023-09-02 VITALS — BP 117/74 | HR 84 | Temp 97.5°F | Wt 142.4 lb

## 2023-09-02 DIAGNOSIS — J180 Bronchopneumonia, unspecified organism: Secondary | ICD-10-CM

## 2023-09-02 DIAGNOSIS — J0100 Acute maxillary sinusitis, unspecified: Secondary | ICD-10-CM | POA: Diagnosis not present

## 2023-09-02 DIAGNOSIS — Z8616 Personal history of COVID-19: Secondary | ICD-10-CM | POA: Diagnosis not present

## 2023-09-02 MED ORDER — LEVOFLOXACIN 500 MG PO TABS: 500.0000 mg | ORAL_TABLET | Freq: Every day | ORAL | 0 refills | Status: AC

## 2023-09-02 MED ORDER — PREDNISONE 10 MG PO TABS: ORAL_TABLET | ORAL | 0 refills | Status: DC

## 2023-09-02 MED ORDER — ALBUTEROL SULFATE HFA 108 (90 BASE) MCG/ACT IN AERS: 2.0000 | INHALATION_SPRAY | Freq: Four times a day (QID) | RESPIRATORY_TRACT | 0 refills | Status: DC | PRN

## 2023-09-02 NOTE — Progress Notes (Signed)
OFFICE VISIT  09/02/2023  CC:  Chief Complaint  Patient presents with   Cough    Pt had covid in December; has ongoing cough/congestion. Has been taking Nyquil along with tylenol since finishing the course of antibiotics.     Patient is a 73 y.o. female who presents for cough.  HPI: Persistent URI symptoms and cough for approximately 6 to 7 weeks now. Was covid POS 07/21/23 Paxlovid x 5d.  Symptoms not significantly improved so I prescribed doxycycline on 07/28/2023.  She still feels like she has a persistent cough that sometimes comes in fits.  Has general feeling of breathlessness but no labored breathing. She has some feeling of fevers at night. Sinuses feel full and has sinus headaches. Taking NyQuil and albuterol.  Review of systems: No sore throat, no nausea, no vomiting, no diarrhea, no rash. No acute joint pain.  Past Medical History:  Diagnosis Date   Allergic rhinitis 07/09/2013   Anxiety state 06/19/2018   Arrhythmia    Asthma 10/23/2018   Baker's cyst of knee, right 04/20/2017   Carpal tunnel syndrome of right wrist 10/05/2018   Cerebrovascular accident (CVA) (HCC)    09/2016: right caudate lacunar infarct secondary to small vessel disease source. L sided weakness   Diabetes mellitus (HCC) 11/23/2011   Sees Dr Sharl Ma, endocrinology   Diverticulosis    hx of 'itis   Elevated blood pressure (not hypertension) 07/09/2013   GERD (gastroesophageal reflux disease)    Herpes labialis 03/20/2014   Hyperlipidemia    Mood disorder (HCC) 11/23/2011   Neural foraminal stenosis of cervical spine 10/05/2018   Right Side    Orthostatic hypotension 09/18/2015   OSA (obstructive sleep apnea)    Paresthesia    Sleep disorder 07/09/2013   Vitamin D deficiency 05/20/2014    Past Surgical History:  Procedure Laterality Date   ABDOMINAL HYSTERECTOMY  08/03/1991   APPENDECTOMY  08/02/1961   BREAST ENHANCEMENT SURGERY  08/02/1984   COLONOSCOPY     07/2013 no polyps     Outpatient Medications Prior to Visit  Medication Sig Dispense Refill   aspirin EC 81 MG tablet Take 1 tablet (81 mg total) by mouth daily. Swallow whole. 30 tablet 11   atorvastatin (LIPITOR) 40 MG tablet Take 1 tablet (40 mg total) by mouth daily. 90 tablet 3   azelastine (ASTELIN) 0.1 % nasal spray Place 2 sprays into both nostrils 2 (two) times daily. Use in each nostril as directed 30 mL 12   cetirizine (ZYRTEC) 10 MG tablet TAKE 1 TABLET(10 MG) BY MOUTH DAILY 90 tablet 3   Cholecalciferol (VITAMIN D-3) 25 MCG (1000 UT) CAPS Take 1,000 Units by mouth daily.      Continuous Blood Gluc Sensor (FREESTYLE LIBRE 3 SENSOR) MISC apply to upper arm, change sensor every 14 days . change sensor every 14 days for 84 days     HUMALOG KWIKPEN 100 UNIT/ML KiwkPen Inject 20 Units into the skin 3 (three) times daily before meals.  0   insulin degludec (TRESIBA) 100 UNIT/ML FlexTouch Pen Inject 18 Units into the skin at bedtime.     ipratropium (ATROVENT) 0.03 % nasal spray Place 2 sprays into both nostrils every 12 (twelve) hours. 30 mL 0   Multiple Vitamin (MULTIVITAMIN) tablet Take 1 tablet by mouth See admin instructions. Twice weekly by mouth Monday and Wednesday     omeprazole (PRILOSEC) 20 MG capsule Take 1 capsule (20 mg total) by mouth 3 (three) times a week. 90 capsule 1  albuterol (VENTOLIN HFA) 108 (90 Base) MCG/ACT inhaler Inhale 2 puffs into the lungs every 6 (six) hours as needed for wheezing or shortness of breath. 18 g 11   HYDROcodone bit-homatropine (HYCODAN) 5-1.5 MG/5ML syrup 1-2 tsp po bid prn cough 120 mL 0   atorvastatin (LIPITOR) 40 MG tablet TAKE ONE TABLET BY MOUTH EVERY DAY AT 6PM (Patient not taking: Reported on 07/20/2023) 90 tablet 1   No facility-administered medications prior to visit.    Allergies  Allergen Reactions   Canagliflozin Other (See Comments)   Dulaglutide Other (See Comments)   Influenza Vaccine Live     Other Reaction(s): fever, swollen arm    Metformin And Related Diarrhea    Review of Systems  As per HPI  PE:    09/02/2023    8:54 AM 07/28/2023    3:12 PM 07/20/2023    2:26 PM  Vitals with BMI  Weight 142 lbs 6 oz  144 lbs 8 oz  Systolic 117  120  Diastolic 74  75  Pulse 84 77      Physical Exam  VS: noted--normal. Gen: alert, NAD, NONTOXIC APPEARING. HEENT: eyes without injection, drainage, or swelling.  Ears: EACs clear, TMs with normal light reflex and landmarks.  Nose: Clear rhinorrhea, with some dried, crusty exudate adherent to mildly injected mucosa.  No purulent d/c.  No paranasal sinus TTP.  No facial swelling.  Throat and mouth without focal lesion.  No pharyngial swelling, erythema, or exudate.   Neck: supple, no LAD.   LUNGS: CTA bilat, nonlabored resps.   CV: RRR, no m/r/g. EXT: no c/c/e SKIN: no rash   LABS:  Last CBC Lab Results  Component Value Date   WBC 5.1 07/13/2019   HGB 11.9 (L) 07/13/2019   HCT 36.2 07/13/2019   MCV 91.6 07/13/2019   MCH 30.1 07/13/2019   RDW 12.9 07/13/2019   PLT 179 07/13/2019   Last metabolic panel Lab Results  Component Value Date   GLUCOSE 191 (H) 01/18/2023   NA 135 01/18/2023   K 4.0 01/18/2023   CL 100 01/18/2023   CO2 25 01/18/2023   BUN 7 01/18/2023   CREATININE 0.91 01/18/2023   GFR 62.67 01/18/2023   CALCIUM 9.5 01/18/2023   PROT 8.7 (H) 01/18/2023   ALBUMIN 4.3 01/18/2023   BILITOT 0.7 01/18/2023   ALKPHOS 108 01/18/2023   AST 34 01/18/2023   ALT 23 01/18/2023   ANIONGAP 10 07/13/2019   Lab Results  Component Value Date   HGBA1C 9.9 01/07/2023   IMPRESSION AND PLAN:  Persistent URI and bronchitis status post COVID infection 6 weeks ago. Levaquin 500 mg daily for 7 days. Prednisone 20 mg a day x 3 days then 10 mg a day x 3 days (she gets a very brisk hyperglycemia response on steroids so I am trying to go low-dose). Refilled albuterol inhaler, 1 to 2 puffs every 6 hours as needed.  Continue over-the-counter symptomatic  care.  An After Visit Summary was printed and given to the patient.  FOLLOW UP: Return if symptoms worsen or fail to improve.  Signed:  Santiago Bumpers, MD           09/02/2023

## 2023-09-16 ENCOUNTER — Ambulatory Visit: Payer: Self-pay | Admitting: Family Medicine

## 2023-09-16 DIAGNOSIS — J029 Acute pharyngitis, unspecified: Secondary | ICD-10-CM | POA: Diagnosis not present

## 2023-09-16 NOTE — Telephone Encounter (Addendum)
3 attempts have been made to contact the patient with voicemail left for each call. Routing to clinic.   Copied from CRM [id number]. Topic: Clinical - Medical Advice >> Sep 16, 2023  4:21 PM Wendy Mcclain wrote: Reason for CRM: The patient Wendy Mcclain is calling on behalf of her grandson who is not an established patient but who is having a fever on and off and also having a runny nose. She wanted to get him seen today but there were no available appointments. She would like a call back for medical advice on what she should do. [phone] is the call back number

## 2023-09-21 ENCOUNTER — Telehealth (INDEPENDENT_AMBULATORY_CARE_PROVIDER_SITE_OTHER): Payer: Medicare Other | Admitting: Urgent Care

## 2023-09-21 ENCOUNTER — Encounter: Payer: Self-pay | Admitting: Urgent Care

## 2023-09-21 DIAGNOSIS — J029 Acute pharyngitis, unspecified: Secondary | ICD-10-CM | POA: Diagnosis not present

## 2023-09-21 DIAGNOSIS — Z20818 Contact with and (suspected) exposure to other bacterial communicable diseases: Secondary | ICD-10-CM

## 2023-09-21 MED ORDER — CEPHALEXIN 500 MG PO CAPS: 500.0000 mg | ORAL_CAPSULE | Freq: Two times a day (BID) | ORAL | 0 refills | Status: AC

## 2023-09-21 NOTE — Progress Notes (Signed)
Virtual Visit via Video Note  I connected with Wendy Mcclain on 09/21/23 at  8:40 AM EST by a video enabled telemedicine application and verified that I am speaking with the correct person using two identifiers.  Patient Location: Home Provider Location: Office/Clinic  I discussed the limitations, risks, security, and privacy concerns of performing an evaluation and management service by video and the availability of in person appointments. I also discussed with the patient that there may be a patient responsible charge related to this service. The patient expressed understanding and agreed to proceed.   Subjective  PCP: Jeoffrey Massed, MD  No chief complaint on file.   HPI: Patient complains of cough, sore throat, and fever.  She denies head congestion, chest congestion, head/chest congestion, headache, sneezing, facial pain/pressure, postnasal drainage, shortness of breath, wheezing, nausea, vomiting, abdominal pain, diarrhea, fatigue, body aches, and rash .  Onset of symptoms was 2 days ago, gradually worsening since that time.  She is drinking plenty of fluids. Evaluation to date: none.  Treatment to date: none.  She has a history of covid back in Dec 2024, does not believe to have covid now. Her grandson was diagnosed with strep throat on Friday, and he lives with patient. She states her symptoms started 3 days after his diagnosis. Believes she has strep throat given exposure and her current throat symptoms.  She does not smoke.   ROS: Per HPI. All other pertinent systems are negative.   Current Outpatient Medications:    albuterol (VENTOLIN HFA) 108 (90 Base) MCG/ACT inhaler, Inhale 2 puffs into the lungs every 6 (six) hours as needed for wheezing or shortness of breath., Disp: 18 g, Rfl: 0   aspirin EC 81 MG tablet, Take 1 tablet (81 mg total) by mouth daily. Swallow whole., Disp: 30 tablet, Rfl: 11   atorvastatin (LIPITOR) 40 MG tablet, Take 1 tablet (40 mg total) by  mouth daily., Disp: 90 tablet, Rfl: 3   azelastine (ASTELIN) 0.1 % nasal spray, Place 2 sprays into both nostrils 2 (two) times daily. Use in each nostril as directed, Disp: 30 mL, Rfl: 12   cetirizine (ZYRTEC) 10 MG tablet, TAKE 1 TABLET(10 MG) BY MOUTH DAILY, Disp: 90 tablet, Rfl: 3   Cholecalciferol (VITAMIN D-3) 25 MCG (1000 UT) CAPS, Take 1,000 Units by mouth daily. , Disp: , Rfl:    Continuous Blood Gluc Sensor (FREESTYLE LIBRE 3 SENSOR) MISC, apply to upper arm, change sensor every 14 days . change sensor every 14 days for 84 days, Disp: , Rfl:    HUMALOG KWIKPEN 100 UNIT/ML KiwkPen, Inject 20 Units into the skin 3 (three) times daily before meals., Disp: , Rfl: 0   insulin degludec (TRESIBA) 100 UNIT/ML FlexTouch Pen, Inject 18 Units into the skin at bedtime., Disp: , Rfl:    ipratropium (ATROVENT) 0.03 % nasal spray, Place 2 sprays into both nostrils every 12 (twelve) hours., Disp: 30 mL, Rfl: 0   Multiple Vitamin (MULTIVITAMIN) tablet, Take 1 tablet by mouth See admin instructions. Twice weekly by mouth Monday and Wednesday, Disp: , Rfl:    omeprazole (PRILOSEC) 20 MG capsule, Take 1 capsule (20 mg total) by mouth 3 (three) times a week., Disp: 90 capsule, Rfl: 1   predniSONE (DELTASONE) 10 MG tablet, 2 tabs po every day x 3d then 1 tab po every day x 3d, Disp: 9 tablet, Rfl: 0    Objective  Vital signs not able to be obtained due to this being a  virtual visit.  Physical Exam  Due to video complications, actual visualization was not possible however the visit was conducted through our [employer]. Assessment & Plan Sore throat  1. Sore throat  Exposure to strep throat  1. Sore throat  2. Exposure to strep throat   Given patients known exposure to strep throat, current symptoms of sore throat and low grade fever (99.3), will start treatment with keflex 500mg  twice daily x 10 days. No additional sx to suggest scarlet fever. Pt with no known antibiotic allergies. Pt  given the opportunity to ask questions - all concerns addressed. Encouraged pt to follow up in office if any new or worsening symptoms, or failure to respond to current treatment plan.  No follow-ups on file.   I discussed the assessment and treatment plan with the patient. The patient was provided an opportunity to ask questions, and all were answered. The patient agreed with the plan and demonstrated an understanding of the instructions.   The patient was advised to call back or seek an in-person evaluation if the symptoms worsen or if the condition fails to improve as anticipated.  The above assessment and management plan was discussed with the patient. The patient verbalized understanding of and has agreed to the management plan.   Maretta Bees, PA

## 2023-09-21 NOTE — Patient Instructions (Signed)
Please read the attached handout regarding strep throat.  Please start taking the antibiotics as prescribed. Do not stop taking them until all have been completed. (10 days) After completing the third day of antibiotics, throw away your current toothbrush and get a new one. This will prevent re-contamination.  You may alternate ibuprofen and tylenol every 4 hours for fever and pain control.  Do not share food, beverages, or kiss on the lips until >48 hours after starting antibiotics and >24 hours after fever resolved. This is contagious.   If any new or worsening symptoms, or failure to respond to current treatment plan, please schedule an in office follow up visit.

## 2023-10-05 ENCOUNTER — Ambulatory Visit: Payer: Medicare Other | Admitting: *Deleted

## 2023-10-05 DIAGNOSIS — Z0000 Encounter for general adult medical examination without abnormal findings: Secondary | ICD-10-CM

## 2023-10-05 NOTE — Progress Notes (Signed)
 Subjective:   Wendy Mcclain is a 73 y.o. female who presents for Medicare Annual (Subsequent) preventive examination.  Visit Complete: Virtual I connected with  Wendy Mcclain on 10/05/23 by a audio enabled telemedicine application and verified that I am speaking with the correct person using two identifiers.  Patient Location: Home  Provider Location: Home Office  I discussed the limitations of evaluation and management by telemedicine. The patient expressed understanding and agreed to proceed.  Vital Signs: Because this visit was a virtual/telehealth visit, some criteria may be missing or patient reported. Any vitals not documented were not able to be obtained and vitals that have been documented are patient reported.  Patient Medicare AWV questionnaire was completed by the patient on 10-05-2023; I have confirmed that all information answered by patient is correct and no changes since this date.  Cardiac Risk Factors include: advanced age (>43men, >4 women);diabetes mellitus     Objective:    There were no vitals filed for this visit. There is no height or weight on file to calculate BMI.     10/05/2023    9:25 AM 09/29/2022   10:08 AM 01/19/2022    8:49 AM 10/07/2021   10:52 AM 09/22/2021   10:03 AM 04/29/2021    9:02 AM 12/26/2019   11:37 AM  Advanced Directives  Does Patient Have a Medical Advance Directive? Yes Yes No Yes Yes No Yes  Type of Advance Directive Living will Healthcare Power of [place];Living will  Living will Living will  Healthcare Power of [place];Living will  Does patient want to make changes to medical advance directive?     No - Patient declined  No - Guardian declined  Copy of Healthcare Power of Attorney in Chart?  No - copy requested     No - copy requested  Would patient like information on creating a medical advance directive?   No - Patient declined No - Patient declined  No - Patient declined     Current Medications (verified) Outpatient Encounter  Medications as of 10/05/2023  Medication Sig   albuterol (VENTOLIN HFA) 108 (90 Base) MCG/ACT inhaler Inhale 2 puffs into the lungs every 6 (six) hours as needed for wheezing or shortness of breath.   aspirin EC 81 MG tablet Take 1 tablet (81 mg total) by mouth daily. Swallow whole.   atorvastatin (LIPITOR) 40 MG tablet Take 1 tablet (40 mg total) by mouth daily.   azelastine (ASTELIN) 0.1 % nasal spray Place 2 sprays into both nostrils 2 (two) times daily. Use in each nostril as directed   cetirizine (ZYRTEC) 10 MG tablet TAKE 1 TABLET(10 MG) BY MOUTH DAILY   Cholecalciferol (VITAMIN D-3) 25 MCG (1000 UT) CAPS Take 1,000 Units by mouth daily.    Continuous Blood Gluc Sensor (FREESTYLE LIBRE 3 SENSOR) MISC apply to upper arm, change sensor every 14 days . change sensor every 14 days for 84 days   HUMALOG KWIKPEN 100 UNIT/ML KiwkPen Inject 20 Units into the skin 3 (three) times daily before meals.   insulin degludec (TRESIBA) 100 UNIT/ML FlexTouch Pen Inject 18 Units into the skin at bedtime.   ipratropium (ATROVENT) 0.03 % nasal spray Place 2 sprays into both nostrils every 12 (twelve) hours.   Multiple Vitamin (MULTIVITAMIN) tablet Take 1 tablet by mouth See admin instructions. Twice weekly by mouth Monday and Wednesday   omeprazole (PRILOSEC) 20 MG capsule Take 1 capsule (20 mg total) by mouth 3 (three) times a week.   predniSONE (  DELTASONE) 10 MG tablet 2 tabs po every day x 3d then 1 tab po every day x 3d   No facility-administered encounter medications on file as of 10/05/2023.    Allergies (verified) Canagliflozin, Dulaglutide, Influenza vaccine live, and Metformin and related   History: Past Medical History:  Diagnosis Date   Allergic rhinitis 07/09/2013   Anxiety state 06/19/2018   Arrhythmia    Asthma 10/23/2018   Baker's cyst of knee, right 04/20/2017   Carpal tunnel syndrome of right wrist 10/05/2018   Cerebrovascular accident (CVA) (HCC)    09/2016: right caudate lacunar  infarct secondary to small vessel disease source. L sided weakness   Diabetes mellitus (HCC) 11/23/2011   Sees Dr Sharl Ma, endocrinology   Diverticulosis    hx of 'itis   Elevated blood pressure (not hypertension) 07/09/2013   GERD (gastroesophageal reflux disease)    Herpes labialis 03/20/2014   Hyperlipidemia    Mood disorder (HCC) 11/23/2011   Neural foraminal stenosis of cervical spine 10/05/2018   Right Side    Orthostatic hypotension 09/18/2015   OSA (obstructive sleep apnea)    Paresthesia    Sleep disorder 07/09/2013   Vitamin D deficiency 05/20/2014   Past Surgical History:  Procedure Laterality Date   ABDOMINAL HYSTERECTOMY  08/03/1991   APPENDECTOMY  08/02/1961   BREAST ENHANCEMENT SURGERY  08/02/1984   COLONOSCOPY     07/2013 no polyps   Family History  Problem Relation Age of Onset   Alzheimer's disease Mother    Cancer Father        esophageal   Cancer Sister        stomach   Thyroid disease Sister    Other Sister        MVA   Crohn's disease Sister    Squamous cell carcinoma Brother    Diabetes Brother    Throat cancer Maternal Grandfather        jaw   Cancer Paternal Grandfather        mouth & larynx   Coronary artery disease Paternal Grandfather    Hyperlipidemia Daughter    Thyroid disease Daughter    Aortic aneurysm Paternal Aunt        2 aunts died at age 83   Cancer Paternal Aunt        colon   Heart disease Other    Diabetes Other    Hyperlipidemia Other    Hypertension Other    Stroke Other    Colon cancer Neg Hx    Social History   Socioeconomic History   Marital status: Widowed    Spouse name: Not on file   Number of children: 2   Years of education: 12   Highest education level: High school graduate  Occupational History   Occupation: Retired  Tobacco Use   Smoking status: Never   Smokeless tobacco: Never  Vaping Use   Vaping status: Never Used  Substance and Sexual Activity   Alcohol use: No   Drug use: No   Sexual  activity: Not Currently  Other Topics Concern   Not on file  Social History Narrative   Patient lives alone in [place], she is a widow.    Patient has 2 daughters she is very close with.    Patient enjoys going to the beach (daughter has house there) working out, family, TV, and reading.       Social Drivers of Health   Financial Resource Strain: Low Risk  (10/05/2023)   Overall Financial  Resource Strain (CARDIA)    Difficulty of Paying Living Expenses: Not hard at all  Food Insecurity: No Food Insecurity (10/05/2023)   Hunger Vital Sign    Worried About Running Out of Food in the Last Year: Never true    Ran Out of Food in the Last Year: Never true  Transportation Needs: No Transportation Needs (10/05/2023)   PRAPARE - Administrator, Civil Service (Medical): No    Lack of Transportation (Non-Medical): No  Physical Activity: Insufficiently Active (10/05/2023)   Exercise Vital Sign    Days of Exercise per Week: 4 days    Minutes of Exercise per Session: 30 min  Stress: No Stress Concern Present (10/05/2023)   [employer] of Occupational Health - Occupational Stress Questionnaire    Feeling of Stress : Not at all  Social Connections: Moderately Isolated (10/05/2023)   Social Connection and Isolation Panel [NHANES]    Frequency of Communication with Friends and Family: More than three times a week    Frequency of Social Gatherings with Friends and Family: More than three times a week    Attends Religious Services: More than 4 times per year    Active Member of [employer] or Organizations: No    Attends Banker Meetings: Never    Marital Status: Widowed    Tobacco Counseling Counseling given: Not Answered   Clinical Intake:  Pre-visit preparation completed: Yes  Pain : No/denies pain     Diabetes: Yes CBG done?: No Did pt. bring in CBG monitor from home?: No  How often do you need to have someone help you when you read instructions, pamphlets, or  other written materials from your doctor or pharmacy?: 1 - Never  Interpreter Needed?: No  Information entered by :: Remi Haggard LPN   Activities of Daily Living    10/05/2023    9:26 AM 10/05/2023    8:01 AM  In your present state of health, do you have any difficulty performing the following activities:  Hearing? 0 0  Vision? 0 0  Difficulty concentrating or making decisions? 0 0  Walking or climbing stairs? 0 0  Dressing or bathing? 0 0  Doing errands, shopping? 0 0  Preparing Food and eating ? N N  Using the Toilet? N N  In the past six months, have you accidently leaked urine? N N  Do you have problems with loss of bowel control? N N  Managing your Medications? N N  Managing your Finances? N N  Housekeeping or managing your Housekeeping? N N    Patient Care Team: Jeoffrey Massed, MD as PCP - General (Family Medicine) Talmage Coin, MD as Consulting Physician (Endocrinology) Hilarie Fredrickson, MD as Consulting Physician (Gastroenterology) Nahser, Deloris Ping, MD as Consulting Physician (Cardiology) Genia Del Daisy Blossom, MD as Consulting Physician (Ophthalmology) Doug Sou, MD as Referring Physician (Dermatology)  Indicate any recent Medical Services you may have received from other than [facility] providers in the past year (date may be approximate).     Assessment:   This is a routine wellness examination for Evoleht.  Hearing/Vision screen Hearing Screening - Comments:: No trouble hearing Vision Screening - Comments:: [facility]  Up to date   Goals Addressed               This Visit's Progress     Become More Active   Not on track     Timeframe:  Long-Range Goal Priority:  High Start Date:  Expected End Date:                       Follow Up Date 03/22/2022    - keep track of how long I exercise - keep track of how often I exercise    Why is this important?   It is easy to come up with reasons not to exercise.  These steps  will help you get started and have fun doing it.    Notes:       Control Diabetes-obtain A1C <7.0 (pt-stated)   On track     A1C 9.0 in 04/2019.      Patient Stated        Doreatha Martin lawsuit for husband        Depression Screen    10/05/2023    9:29 AM 01/18/2023   11:24 AM 09/29/2022   10:06 AM 07/09/2022    1:57 PM 01/19/2022    8:47 AM 10/07/2021   10:52 AM 09/22/2021   10:06 AM  PHQ 2/9 Scores  PHQ - 2 Score 0 0 0 0 0 0 0  PHQ- 9 Score 0 1   1 0 0    Fall Risk    10/05/2023    9:24 AM 10/05/2023    8:01 AM 01/18/2023   11:24 AM 09/29/2022   10:09 AM 01/19/2022    8:50 AM  Fall Risk   Falls in the past year? 0 0 0 0 0  Number falls in past yr: 0 0  0 0  Injury with Fall? 0 0 0 0 0  Risk for fall due to :    Impaired vision   Follow up Falls evaluation completed;Education provided;Falls prevention discussed   Falls prevention discussed     MEDICARE RISK AT HOME: Medicare Risk at Home Any stairs in or around the home?: No If so, are there any without handrails?: No Home free of loose throw rugs in walkways, pet beds, electrical cords, etc?: Yes Adequate lighting in your home to reduce risk of falls?: Yes Life alert?: No Use of a cane, walker or w/c?: No Grab bars in the bathroom?: Yes Shower chair or bench in shower?: Yes Elevated toilet seat or a handicapped toilet?: No  TIMED UP AND GO:  Was the test performed?  No    Cognitive Function:        10/05/2023    9:25 AM 09/29/2022   10:09 AM 09/22/2021   10:12 AM 12/26/2019   11:18 AM  6CIT Screen  What Year? 0 points 0 points 0 points 0 points  What month? 0 points 0 points 0 points 0 points  What time? 0 points 0 points 0 points 0 points  Count back from 20 0 points 0 points 0 points 0 points  Months in reverse 0 points 0 points 0 points 0 points  Repeat phrase 0 points 0 points 2 points 2 points  Total Score 0 points 0 points 2 points 2 points    Immunizations  There is no immunization history on file for this  patient.  TDAP status: Due, Education has been provided regarding the importance of this vaccine. Advised may receive this vaccine at local pharmacy or Health Dept. Aware to provide a copy of the vaccination record if obtained from local pharmacy or Health Dept. Verbalized acceptance and understanding.  Flu Vaccine status: Declined, Education has been provided regarding the importance of this vaccine but patient still declined. Advised may receive this  vaccine at local pharmacy or Health Dept. Aware to provide a copy of the vaccination record if obtained from local pharmacy or Health Dept. Verbalized acceptance and understanding.  Pneumococcal vaccine status: Due, Education has been provided regarding the importance of this vaccine. Advised may receive this vaccine at local pharmacy or Health Dept. Aware to provide a copy of the vaccination record if obtained from local pharmacy or Health Dept. Verbalized acceptance and understanding.  Covid-19 vaccine status: Information provided on how to obtain vaccines.   Qualifies for Shingles Vaccine? Yes   Zostavax completed No   Shingrix Completed?: No.    Education has been provided regarding the importance of this vaccine. Patient has been advised to call insurance company to determine out of pocket expense if they have not yet received this vaccine. Advised may also receive vaccine at local pharmacy or Health Dept. Verbalized acceptance and understanding.  Screening Tests Health Maintenance  Topic Date Due   COVID-19 Vaccine (1) Never done   DEXA SCAN  Never done   FOOT EXAM  05/19/2023   OPHTHALMOLOGY EXAM  06/05/2023   HEMOGLOBIN A1C  07/09/2023   Colonoscopy  08/02/2023   MAMMOGRAM  09/02/2023   Diabetic kidney evaluation - Urine ACR  10/07/2023   Zoster Vaccines- Shingrix (1 of 2) 10/26/2023 (Originally 09/21/1969)   INFLUENZA VACCINE  10/31/2023 (Originally 03/03/2023)   Pneumonia Vaccine 20+ Years old (1 of 2 - PCV) 07/27/2024 (Originally  09/22/1956)   Diabetic kidney evaluation - eGFR measurement  01/18/2024   Medicare Annual Wellness (AWV)  10/04/2024   Hepatitis C Screening  Completed   HPV VACCINES  Aged Out   DTaP/Tdap/Td  Discontinued    Health Maintenance  Health Maintenance Due  Topic Date Due   COVID-19 Vaccine (1) Never done   DEXA SCAN  Never done   FOOT EXAM  05/19/2023   OPHTHALMOLOGY EXAM  06/05/2023   HEMOGLOBIN A1C  07/09/2023   Colonoscopy  08/02/2023   MAMMOGRAM  09/02/2023   Diabetic kidney evaluation - Urine ACR  10/07/2023    Colonoscopy  declined  Mammogram declined    Bone Density  declined  Lung Cancer Screening: (Low Dose CT Chest recommended if Age 53-80 years, 20 pack-year currently smoking OR have quit w/in 15years.) does not qualify.   Lung Cancer Screening Referral:   Additional Screening:  Hepatitis C Screening: does not qualify; Completed 2020  Vision Screening: Recommended annual ophthalmology exams for early detection of glaucoma and other disorders of the eye. Is the patient up to date with their annual eye exam?  Yes  Who is the provider or what is the name of the office in which the patient attends annual eye exams? [facility] If pt is not established with a provider, would they like to be referred to a provider to establish care? No .   Dental Screening: Recommended annual dental exams for proper oral hygiene  Nutrition Risk Assessment:  Has the patient had any N/V/D within the last 2 months?  No  Does the patient have any non-healing wounds?  No  Has the patient had any unintentional weight loss or weight gain?  No   Diabetes:  Is the patient diabetic?  Yes  If diabetic, was a CBG obtained today?  No  Did the patient bring in their glucometer from home?  No  How often do you monitor your CBG's? Continuous Blood Glucose  .   Financial Strains and Diabetes Management:  Are you having any financial strains  with the device, your supplies or your medication?  No .  Does the patient want to be seen by Chronic Care Management for management of their diabetes?  No  Would the patient like to be referred to a Nutritionist or for Diabetic Management?  No   Diabetic Exams:  Diabetic Eye Exam Pt has been advised about the importance in completing this exam.  Diabetic Foot Exam: . Pt has been advised about the importance in completing this exam. .    Community Resource Referral / Chronic Care Management: CRR required this visit?  No   CCM required this visit?  No     Plan:     I have personally reviewed and noted the following in the patient's chart:   Medical and social history Use of alcohol, tobacco or illicit drugs  Current medications and supplements including opioid prescriptions. Patient is not currently taking opioid prescriptions. Functional ability and status Nutritional status Physical activity Advanced directives List of other physicians Hospitalizations, surgeries, and ER visits in previous 12 months Vitals Screenings to include cognitive, depression, and falls Referrals and appointments  In addition, I have reviewed and discussed with patient certain preventive protocols, quality metrics, and best practice recommendations. A written personalized care plan for preventive services as well as general preventive health recommendations were provided to patient.     Remi Haggard, LPN   01/31/5365   After Visit Summary: (MyChart) Due to this being a telephonic visit, the after visit summary with patients personalized plan was offered to patient via MyChart   Nurse Notes:

## 2023-10-05 NOTE — Patient Instructions (Signed)
 Wendy Mcclain , Thank you for taking time to come for your Medicare Wellness Visit. I appreciate your ongoing commitment to your health goals. Please review the following plan we discussed and let me know if I can assist you in the future.   Screening recommendations/referrals: Colonoscopy: Education provided Mammogram: Education provided Bone Density: Education provided Recommended yearly ophthalmology/optometry visit for glaucoma screening and checkup Recommended yearly dental visit for hygiene and checkup  Vaccinations: Influenza vaccine: education provided Pneumococcal vaccine: Education provided Tdap vaccine: Education provided Shingles vaccine: Educaiton provided    Advanced directives: yes     Preventive Care 65 Years and Older, Female Preventive care refers to lifestyle choices and visits with your health care provider that can promote health and wellness. What does preventive care include? A yearly physical exam. This is also called an annual well check. Dental exams once or twice a year. Routine eye exams. Ask your health care provider how often you should have your eyes checked. Personal lifestyle choices, including: Daily care of your teeth and gums. Regular physical activity. Eating a healthy diet. Avoiding tobacco and drug use. Limiting alcohol use. Practicing safe sex. Taking low-dose aspirin every day. Taking vitamin and mineral supplements as recommended by your health care provider. What happens during an annual well check? The services and screenings done by your health care provider during your annual well check will depend on your age, overall health, lifestyle risk factors, and family history of disease. Counseling  Your health care provider may ask you questions about your: Alcohol use. Tobacco use. Drug use. Emotional well-being. Home and relationship well-being. Sexual activity. Eating habits. History of falls. Memory and ability to understand  (cognition). Work and work Astronomer. Reproductive health. Screening  You may have the following tests or measurements: Height, weight, and BMI. Blood pressure. Lipid and cholesterol levels. These may be checked every 5 years, or more frequently if you are over 74 years old. Skin check. Lung cancer screening. You may have this screening every year starting at age 74 if you have a 30-pack-year history of smoking and currently smoke or have quit within the past 15 years. Fecal occult blood test (FOBT) of the stool. You may have this test every year starting at age 74. Flexible sigmoidoscopy or colonoscopy. You may have a sigmoidoscopy every 5 years or a colonoscopy every 10 years starting at age 74. Hepatitis C blood test. Hepatitis B blood test. Sexually transmitted disease (STD) testing. Diabetes screening. This is done by checking your blood sugar (glucose) after you have not eaten for a while (fasting). You may have this done every 1-3 years. Bone density scan. This is done to screen for osteoporosis. You may have this done starting at age 74. Mammogram. This may be done every 1-2 years. Talk to your health care provider about how often you should have regular mammograms. Talk with your health care provider about your test results, treatment options, and if necessary, the need for more tests. Vaccines  Your health care provider may recommend certain vaccines, such as: Influenza vaccine. This is recommended every year. Tetanus, diphtheria, and acellular pertussis (Tdap, Td) vaccine. You may need a Td booster every 10 years. Zoster vaccine. You may need this after age 45. Pneumococcal 13-valent conjugate (PCV13) vaccine. One dose is recommended after age 74. Pneumococcal polysaccharide (PPSV23) vaccine. One dose is recommended after age 74. Talk to your health care provider about which screenings and vaccines you need and how often you need them. This information is  not intended to  replace advice given to you by your health care provider. Make sure you discuss any questions you have with your health care provider. Document Released: 08/15/2015 Document Revised: 04/07/2016 Document Reviewed: 05/20/2015 Elsevier Interactive Patient Education  2017 [employer].  Fall Prevention in the Home Falls can cause injuries. They can happen to people of all ages. There are many things you can do to make your home safe and to help prevent falls. What can I do on the outside of my home? Regularly fix the edges of walkways and driveways and fix any cracks. Remove anything that might make you trip as you walk through a door, such as a raised step or threshold. Trim any bushes or trees on the path to your home. Use bright outdoor lighting. Clear any walking paths of anything that might make someone trip, such as rocks or tools. Regularly check to see if handrails are loose or broken. Make sure that both sides of any steps have handrails. Any raised decks and porches should have guardrails on the edges. Have any leaves, snow, or ice cleared regularly. Use sand or salt on walking paths during winter. Clean up any spills in your garage right away. This includes oil or grease spills. What can I do in the bathroom? Use night lights. Install grab bars by the toilet and in the tub and shower. Do not use towel bars as grab bars. Use non-skid mats or decals in the tub or shower. If you need to sit down in the shower, use a plastic, non-slip stool. Keep the floor dry. Clean up any water that spills on the floor as soon as it happens. Remove soap buildup in the tub or shower regularly. Attach bath mats securely with double-sided non-slip rug tape. Do not have throw rugs and other things on the floor that can make you trip. What can I do in the bedroom? Use night lights. Make sure that you have a light by your bed that is easy to reach. Do not use any sheets or blankets that are too big for  your bed. They should not hang down onto the floor. Have a firm chair that has side arms. You can use this for support while you get dressed. Do not have throw rugs and other things on the floor that can make you trip. What can I do in the kitchen? Clean up any spills right away. Avoid walking on wet floors. Keep items that you use a lot in easy-to-reach places. If you need to reach something above you, use a strong step stool that has a grab bar. Keep electrical cords out of the way. Do not use floor polish or wax that makes floors slippery. If you must use wax, use non-skid floor wax. Do not have throw rugs and other things on the floor that can make you trip. What can I do with my stairs? Do not leave any items on the stairs. Make sure that there are handrails on both sides of the stairs and use them. Fix handrails that are broken or loose. Make sure that handrails are as long as the stairways. Check any carpeting to make sure that it is firmly attached to the stairs. Fix any carpet that is loose or worn. Avoid having throw rugs at the top or bottom of the stairs. If you do have throw rugs, attach them to the floor with carpet tape. Make sure that you have a light switch at the top of the  stairs and the bottom of the stairs. If you do not have them, ask someone to add them for you. What else can I do to help prevent falls? Wear shoes that: Do not have high heels. Have rubber bottoms. Are comfortable and fit you well. Are closed at the toe. Do not wear sandals. If you use a stepladder: Make sure that it is fully opened. Do not climb a closed stepladder. Make sure that both sides of the stepladder are locked into place. Ask someone to hold it for you, if possible. Clearly mark and make sure that you can see: Any grab bars or handrails. First and last steps. Where the edge of each step is. Use tools that help you move around (mobility aids) if they are needed. These  include: Canes. Walkers. Scooters. Crutches. Turn on the lights when you go into a dark area. Replace any light bulbs as soon as they burn out. Set up your furniture so you have a clear path. Avoid moving your furniture around. If any of your floors are uneven, fix them. If there are any pets around you, be aware of where they are. Review your medicines with your doctor. Some medicines can make you feel dizzy. This can increase your chance of falling. Ask your doctor what other things that you can do to help prevent falls. This information is not intended to replace advice given to you by your health care provider. Make sure you discuss any questions you have with your health care provider. Document Released: 05/15/2009 Document Revised: 12/25/2015 Document Reviewed: 08/23/2014 Elsevier Interactive Patient Education  2017 [employer].

## 2023-10-20 DIAGNOSIS — Z03818 Encounter for observation for suspected exposure to other biological agents ruled out: Secondary | ICD-10-CM | POA: Diagnosis not present

## 2023-10-20 DIAGNOSIS — J020 Streptococcal pharyngitis: Secondary | ICD-10-CM | POA: Diagnosis not present

## 2023-10-20 DIAGNOSIS — R051 Acute cough: Secondary | ICD-10-CM | POA: Diagnosis not present

## 2023-10-27 NOTE — Telephone Encounter (Signed)
 Pt declined, HM updated

## 2023-11-15 DIAGNOSIS — I6930 Unspecified sequelae of cerebral infarction: Secondary | ICD-10-CM | POA: Diagnosis not present

## 2023-11-15 DIAGNOSIS — Z794 Long term (current) use of insulin: Secondary | ICD-10-CM | POA: Diagnosis not present

## 2023-11-15 DIAGNOSIS — M8580 Other specified disorders of bone density and structure, unspecified site: Secondary | ICD-10-CM | POA: Diagnosis not present

## 2023-11-15 DIAGNOSIS — E1165 Type 2 diabetes mellitus with hyperglycemia: Secondary | ICD-10-CM | POA: Diagnosis not present

## 2023-11-15 DIAGNOSIS — E1142 Type 2 diabetes mellitus with diabetic polyneuropathy: Secondary | ICD-10-CM | POA: Diagnosis not present

## 2023-11-15 LAB — HEMOGLOBIN A1C
Hemoglobin A1C: 10.3
Hemoglobin A1C: 10.3

## 2023-11-15 LAB — PROTEIN / CREATININE RATIO, URINE
Creatinine, Urine: 143
Creatinine, Urine: 143

## 2023-11-15 LAB — MICROALBUMIN, URINE
Microalb, Ur: 1.31
Microalb, Ur: 1.31

## 2023-11-15 LAB — MICROALBUMIN / CREATININE URINE RATIO: Microalb Creat Ratio: 9.2

## 2023-12-16 DIAGNOSIS — R519 Headache, unspecified: Secondary | ICD-10-CM | POA: Diagnosis not present

## 2023-12-16 DIAGNOSIS — R051 Acute cough: Secondary | ICD-10-CM | POA: Diagnosis not present

## 2023-12-29 DIAGNOSIS — M8580 Other specified disorders of bone density and structure, unspecified site: Secondary | ICD-10-CM | POA: Diagnosis not present

## 2023-12-29 DIAGNOSIS — E1165 Type 2 diabetes mellitus with hyperglycemia: Secondary | ICD-10-CM | POA: Diagnosis not present

## 2023-12-29 DIAGNOSIS — Z794 Long term (current) use of insulin: Secondary | ICD-10-CM | POA: Diagnosis not present

## 2023-12-29 DIAGNOSIS — E1142 Type 2 diabetes mellitus with diabetic polyneuropathy: Secondary | ICD-10-CM | POA: Diagnosis not present

## 2023-12-29 DIAGNOSIS — I6930 Unspecified sequelae of cerebral infarction: Secondary | ICD-10-CM | POA: Diagnosis not present

## 2024-01-03 DIAGNOSIS — L814 Other melanin hyperpigmentation: Secondary | ICD-10-CM | POA: Diagnosis not present

## 2024-01-03 DIAGNOSIS — Z85828 Personal history of other malignant neoplasm of skin: Secondary | ICD-10-CM | POA: Diagnosis not present

## 2024-01-03 DIAGNOSIS — Z08 Encounter for follow-up examination after completed treatment for malignant neoplasm: Secondary | ICD-10-CM | POA: Diagnosis not present

## 2024-01-03 DIAGNOSIS — L821 Other seborrheic keratosis: Secondary | ICD-10-CM | POA: Diagnosis not present

## 2024-01-03 DIAGNOSIS — D225 Melanocytic nevi of trunk: Secondary | ICD-10-CM | POA: Diagnosis not present

## 2024-02-14 DIAGNOSIS — Z136 Encounter for screening for cardiovascular disorders: Secondary | ICD-10-CM | POA: Diagnosis not present

## 2024-02-14 DIAGNOSIS — R079 Chest pain, unspecified: Secondary | ICD-10-CM | POA: Diagnosis not present

## 2024-02-21 DIAGNOSIS — Z794 Long term (current) use of insulin: Secondary | ICD-10-CM | POA: Diagnosis not present

## 2024-02-21 DIAGNOSIS — I6930 Unspecified sequelae of cerebral infarction: Secondary | ICD-10-CM | POA: Diagnosis not present

## 2024-02-21 DIAGNOSIS — E1165 Type 2 diabetes mellitus with hyperglycemia: Secondary | ICD-10-CM | POA: Diagnosis not present

## 2024-02-21 DIAGNOSIS — E1142 Type 2 diabetes mellitus with diabetic polyneuropathy: Secondary | ICD-10-CM | POA: Diagnosis not present

## 2024-02-21 DIAGNOSIS — M8580 Other specified disorders of bone density and structure, unspecified site: Secondary | ICD-10-CM | POA: Diagnosis not present

## 2024-02-21 LAB — COMPREHENSIVE METABOLIC PANEL WITH GFR
Albumin: 4.2 (ref 3.5–5.0)
eGFR: 67

## 2024-02-21 LAB — HEPATIC FUNCTION PANEL
ALT: 20 U/L (ref 7–35)
AST: 30 (ref 13–35)
Alkaline Phosphatase: 107 (ref 25–125)
Bilirubin, Direct: 0.2 (ref 0.01–0.4)
Bilirubin, Total: 0.6

## 2024-02-21 LAB — LIPID PANEL
Cholesterol: 122 (ref 0–200)
HDL: 60 (ref 35–70)
LDL Cholesterol: 46
LDl/HDL Ratio: 2
Triglycerides: 87 (ref 40–160)

## 2024-02-21 LAB — HEMOGLOBIN A1C: Hemoglobin A1C: 9.4

## 2024-02-21 LAB — BASIC METABOLIC PANEL WITH GFR
Creatinine: 0.9 (ref 0.5–1.1)
Potassium: 4.3 meq/L (ref 3.5–5.1)

## 2024-03-08 ENCOUNTER — Telehealth: Admitting: Physician Assistant

## 2024-03-08 ENCOUNTER — Telehealth: Admitting: Emergency Medicine

## 2024-03-08 DIAGNOSIS — B9689 Other specified bacterial agents as the cause of diseases classified elsewhere: Secondary | ICD-10-CM

## 2024-03-08 DIAGNOSIS — J019 Acute sinusitis, unspecified: Secondary | ICD-10-CM

## 2024-03-08 DIAGNOSIS — J069 Acute upper respiratory infection, unspecified: Secondary | ICD-10-CM

## 2024-03-08 MED ORDER — AMOXICILLIN-POT CLAVULANATE 875-125 MG PO TABS
1.0000 | ORAL_TABLET | Freq: Two times a day (BID) | ORAL | 0 refills | Status: DC
Start: 2024-03-08 — End: 2024-05-25

## 2024-03-08 NOTE — Progress Notes (Signed)
 Patient with technology issues during appointment. Unable to see face or hear voice. Will mark this visit as no charge. I did call the patient and she said she will try to do an evisit instead.

## 2024-03-08 NOTE — Progress Notes (Signed)
 E-Visit for Sinus Problems  We are sorry that you are not feeling well.  Here is how we plan to help!  Based on what you have shared with me it looks like you have sinusitis.  Sinusitis is inflammation and infection in the sinus cavities of the head.  Based on your presentation I believe you most likely have Acute Bacterial Sinusitis.  This is an infection caused by bacteria and is treated with antibiotics. I have prescribed Augmentin  875mg /125mg  one tablet twice daily with food, for 7 days. You may use an oral decongestant such as Mucinex  D or if you have glaucoma or high blood pressure use plain Mucinex . Saline nasal spray help and can safely be used as often as needed for congestion.  If you develop worsening sinus pain, fever or notice severe headache and vision changes, or if symptoms are not better after completion of antibiotic, please schedule an appointment with a health care provider.    Sinus infections are not as easily transmitted as other respiratory infection, however we still recommend that you avoid close contact with loved ones, especially the very young and elderly.  Remember to wash your hands thoroughly throughout the day as this is the number one way to prevent the spread of infection!  Home Care: Only take medications as instructed by your medical team. Complete the entire course of an antibiotic. Do not take these medications with alcohol. A steam or ultrasonic humidifier can help congestion.  You can place a towel over your head and breathe in the steam from hot water coming from a faucet. Avoid close contacts especially the very young and the elderly. Cover your mouth when you cough or sneeze. Always remember to wash your hands.  Get Help Right Away If: You develop worsening fever or sinus pain. You develop a severe head ache or visual changes. Your symptoms persist after you have completed your treatment plan.  Make sure you Understand these instructions. Will watch  your condition. Will get help right away if you are not doing well or get worse.  Thank you for choosing an e-visit.  Your e-visit answers were reviewed by a board certified advanced clinical practitioner to complete your personal care plan. Depending upon the condition, your plan could have included both over the counter or prescription medications.  Please review your pharmacy choice. Make sure the pharmacy is open so you can pick up prescription now. If there is a problem, you may contact your provider through [employer] and have the prescription routed to another pharmacy.  Your safety is important to us . If you have drug allergies check your prescription carefully.   For the next 24 hours you can use MyChart to ask questions about today's visit, request a non-urgent call back, or ask for a work or school excuse. You will get an email in the next two days asking about your experience. I hope that your e-visit has been valuable and will speed your recovery.  I have spent 5 minutes in review of e-visit questionnaire, review and updating patient chart, medical decision making and response to patient.   Jon Belt, PhD, FNP-BC   Review of care everywhere shows A1c was 9.4 on 02/21/24

## 2024-03-13 ENCOUNTER — Other Ambulatory Visit: Payer: Self-pay

## 2024-03-19 DIAGNOSIS — H353131 Nonexudative age-related macular degeneration, bilateral, early dry stage: Secondary | ICD-10-CM | POA: Diagnosis not present

## 2024-03-19 DIAGNOSIS — E119 Type 2 diabetes mellitus without complications: Secondary | ICD-10-CM | POA: Diagnosis not present

## 2024-04-12 DIAGNOSIS — R059 Cough, unspecified: Secondary | ICD-10-CM | POA: Diagnosis not present

## 2024-04-12 DIAGNOSIS — J019 Acute sinusitis, unspecified: Secondary | ICD-10-CM | POA: Diagnosis not present

## 2024-04-24 DIAGNOSIS — L538 Other specified erythematous conditions: Secondary | ICD-10-CM | POA: Diagnosis not present

## 2024-04-24 DIAGNOSIS — L820 Inflamed seborrheic keratosis: Secondary | ICD-10-CM | POA: Diagnosis not present

## 2024-05-28 ENCOUNTER — Encounter: Admitting: Family Medicine

## 2024-06-08 ENCOUNTER — Encounter: Payer: Self-pay | Admitting: Family Medicine

## 2024-06-08 ENCOUNTER — Ambulatory Visit (INDEPENDENT_AMBULATORY_CARE_PROVIDER_SITE_OTHER): Admitting: Family Medicine

## 2024-06-08 VITALS — BP 130/76 | HR 77 | Temp 97.4°F | Ht 62.0 in | Wt 140.8 lb

## 2024-06-08 DIAGNOSIS — F5104 Psychophysiologic insomnia: Secondary | ICD-10-CM | POA: Diagnosis not present

## 2024-06-08 DIAGNOSIS — B3731 Acute candidiasis of vulva and vagina: Secondary | ICD-10-CM | POA: Diagnosis not present

## 2024-06-08 DIAGNOSIS — J4530 Mild persistent asthma, uncomplicated: Secondary | ICD-10-CM

## 2024-06-08 DIAGNOSIS — E7800 Pure hypercholesterolemia, unspecified: Secondary | ICD-10-CM

## 2024-06-08 MED ORDER — TRAZODONE HCL 50 MG PO TABS
ORAL_TABLET | ORAL | 0 refills | Status: AC
Start: 2024-06-08 — End: ?

## 2024-06-08 MED ORDER — OMEPRAZOLE 20 MG PO CPDR: 20.0000 mg | DELAYED_RELEASE_CAPSULE | ORAL | 3 refills | Status: AC

## 2024-06-08 MED ORDER — ATORVASTATIN CALCIUM 40 MG PO TABS: 40.0000 mg | ORAL_TABLET | Freq: Every day | ORAL | 3 refills | Status: AC

## 2024-06-08 MED ORDER — ALBUTEROL SULFATE HFA 108 (90 BASE) MCG/ACT IN AERS: 2.0000 | INHALATION_SPRAY | Freq: Four times a day (QID) | RESPIRATORY_TRACT | 1 refills | Status: AC | PRN

## 2024-06-08 MED ORDER — FLUCONAZOLE 150 MG PO TABS: ORAL_TABLET | ORAL | 0 refills | Status: AC

## 2024-06-08 MED ORDER — FLUTICASONE-SALMETEROL 250-50 MCG/ACT IN AEPB: 1.0000 | INHALATION_SPRAY | Freq: Two times a day (BID) | RESPIRATORY_TRACT | 1 refills | Status: AC

## 2024-06-08 NOTE — Progress Notes (Signed)
 OFFICE VISIT  06/08/2024  CC:  Chief Complaint  Patient presents with   Medical Management of Chronic Issues    Pt is requesting refill for Albuterol , Lipitor and Prilosec. She also mentioned issues with sleep, sinus pressure and HA. Possible yeast infection    Patient is a 73 y.o. female who presents for follow-up diabetes, hypercholesterolemia, A/P as of last visit: #1 diabetes without complication, with current long-term use of insulin . Hba1c at Dr. Micheline office recently was 9.9 (2 wks ago). As noted in HPI, she is hesitant about insulin  use. She is managed by Dr. Faythe and I defer any significant changes in management to him. We did discuss possibly just focusing on gradual increase of her [place] until her fasting glucose is around the 100-110 range.  After that is achieved then she can focus more on fine-tuning her mealtime insulin . Checking electrolytes and kidney function today.   #2 hypercholesterolemia: Most recent lipid panel about 3 months ago--> total cholesterol 129, HDL 62, LDL 53, triglycerides 65. Continue atorvastatin  40 mg a day. Hepatic panel today.   #3 acute sinusitis. Amoxicillin  875 twice daily prescribed. Continue Astelin , Flonase , and Zyrtec .  INTERIM HX: She has increased worry and anxiety since her sister and brother are both in nursing home. This has caused her some difficulty sleeping, most times laying in bed for 45 minutes to an hour before getting to sleep.  Broken sleep for 6 or 7 hours. Has tried some over-the-counter Benadryl  without significant improvement. No panic attacks, no significant prolonged depressed mood.  She continues to see Dr. Faythe for management of her diabetes. She is on Tresiba and Humalog.  Control has not been all that good. She is concerned about vaginal irritation and itching but no discharge.  She takes omeprazole  for her reflux about 3 times a week on average.  She is compliant with atorvastatin  40 mg a day and  aspirin  81 mg a day.  She has a history of mild persistent asthma.  Usually gets worse in the winter.  She has an albuterol  inhaler which helps significantly.  She uses it typically a few times a week for chest tightness and cough.  She has never been a smoker. She has pressure in her sinuses and ears, postnasal drip mostly in the morning.  ROS as above, plus--> no fevers, no CP, no SOB, no dizziness, no HAs, no rashes, no melena/hematochezia.   No myalgias or arthralgias.  No focal weakness, paresthesias, or tremors.  No acute vision or hearing abnormalities.  No dysuria or unusual/new urinary urgency or frequency.  No recent changes in lower legs. No n/v/d or abd pain.  No palpitations.    Past Medical History:  Diagnosis Date   Allergic rhinitis 07/09/2013   Anxiety and depression    Asthma 10/23/2018   Baker's cyst of knee, right 04/20/2017   Carpal tunnel syndrome of right wrist 10/05/2018   Cerebrovascular accident (CVA) (HCC)    09/2016: right caudate lacunar infarct secondary to small vessel disease source. L sided weakness   Diabetes mellitus (HCC) 11/23/2011   Sees Dr Faythe, endocrinology   Diverticulosis    hx of 'itis   Elevated blood pressure (not hypertension) 07/09/2013   GERD (gastroesophageal reflux disease)    Herpes labialis 03/20/2014   Hyperlipidemia    Neural foraminal stenosis of cervical spine 10/05/2018   Right Side    Orthostatic hypotension 09/18/2015   +tilt table   OSA (obstructive sleep apnea)    Palpitations  Paresthesia    Vitamin D  deficiency 05/20/2014    Past Surgical History:  Procedure Laterality Date   ABDOMINAL HYSTERECTOMY  08/03/1991   APPENDECTOMY  08/02/1961   BREAST ENHANCEMENT SURGERY  08/02/1984   COLONOSCOPY     07/2013 no polyps   TRANSTHORACIC ECHOCARDIOGRAM     09/2016 normal except grd I DD and trivial MR    Outpatient Medications Prior to Visit  Medication Sig Dispense Refill   albuterol  (VENTOLIN  HFA) 108 (90 Base)  MCG/ACT inhaler Inhale 2 puffs into the lungs every 6 (six) hours as needed for wheezing or shortness of breath. 18 g 0   aspirin  EC 81 MG tablet Take 1 tablet (81 mg total) by mouth daily. Swallow whole. 30 tablet 11   atorvastatin  (LIPITOR) 40 MG tablet Take 1 tablet (40 mg total) by mouth daily. 90 tablet 3   azelastine  (ASTELIN ) 0.1 % nasal spray Place 2 sprays into both nostrils 2 (two) times daily. Use in each nostril as directed 30 mL 12   cetirizine  (ZYRTEC ) 10 MG tablet TAKE 1 TABLET(10 MG) BY MOUTH DAILY 90 tablet 3   Continuous Blood Gluc Sensor (FREESTYLE LIBRE 3 SENSOR) MISC apply to upper arm, change sensor every 14 days . change sensor every 14 days for 84 days     HUMALOG KWIKPEN 100 UNIT/ML KiwkPen Inject 20 Units into the skin 3 (three) times daily before meals.  0   insulin  degludec (TRESIBA) 100 UNIT/ML FlexTouch Pen Inject 18 Units into the skin at bedtime.     ipratropium (ATROVENT ) 0.03 % nasal spray Place 2 sprays into both nostrils every 12 (twelve) hours. 30 mL 0   Multiple Vitamin (MULTIVITAMIN) tablet Take 1 tablet by mouth See admin instructions. Twice weekly by mouth Monday and Wednesday     omeprazole  (PRILOSEC) 20 MG capsule Take 1 capsule (20 mg total) by mouth 3 (three) times a week. 90 capsule 1   Cholecalciferol (VITAMIN D -3) 25 MCG (1000 UT) CAPS Take 1,000 Units by mouth daily.      No facility-administered medications prior to visit.    Allergies  Allergen Reactions   Canagliflozin Other (See Comments)   Dulaglutide Other (See Comments)   Influenza Vaccine Live     Other Reaction(s): fever, swollen arm   Metformin  And Related Diarrhea    Review of Systems As per HPI  PE:    06/08/2024    3:41 PM 09/02/2023    8:54 AM 07/28/2023    3:12 PM  Vitals with BMI  Height 5' 2    Weight 140 lbs 13 oz 142 lbs 6 oz   BMI 25.75    Systolic 130 117   Diastolic 76 74   Pulse 77 84 77     Physical Exam  Gen: Alert, well appearing.  Patient is  oriented to person, place, time, and situation. AFFECT: pleasant, lucid thought and speech.   LABS:  Last CBC Lab Results  Component Value Date   WBC 5.1 07/13/2019   HGB 11.9 (L) 07/13/2019   HCT 36.2 07/13/2019   MCV 91.6 07/13/2019   MCH 30.1 07/13/2019   RDW 12.9 07/13/2019   PLT 179 07/13/2019   Last metabolic panel Lab Results  Component Value Date   GLUCOSE 191 (H) 01/18/2023   NA 135 01/18/2023   K 4.3 02/21/2024   CL 100 01/18/2023   CO2 25 01/18/2023   BUN 7 01/18/2023   CREATININE 0.9 02/21/2024   EGFR 67 02/21/2024   CALCIUM   9.5 01/18/2023   PROT 8.7 (H) 01/18/2023   ALBUMIN 4.2 02/21/2024   BILITOT 0.7 01/18/2023   ALKPHOS 107 02/21/2024   AST 30 02/21/2024   ALT 20 02/21/2024   ANIONGAP 10 07/13/2019   Last lipids Lab Results  Component Value Date   CHOL 122 02/21/2024   HDL 60 02/21/2024   LDLCALC 46 02/21/2024   TRIG 87 02/21/2024   CHOLHDL 2.0 10/07/2021   Last hemoglobin A1c Lab Results  Component Value Date   HGBA1C 9.4 02/21/2024   Last thyroid  functions Lab Results  Component Value Date   TSH 2.03 01/07/2023   Last vitamin D  Lab Results  Component Value Date   VD25OH 30 03/26/2014   IMPRESSION AND PLAN:  #1 mild persistent asthma.  Will get her on Advair 250/51 puff twice daily for the wintertime months at least. Refilled albuterol .  #2 insomnia, anxiety-induced. Trial of trazodone 50 mg, 1-2 nightly as needed.  3.  Yeast vaginitis.  Diflucan  150 mg x 1 dose, repeat in 1 week.  4.  Hypercholesterolemia well-controlled on atorvastatin  40 mg a day.  Refilled today. (02/21/2024 lipid panel: Total cholesterol at 122, HDL 60, triglyceride 87, LDL 46.)  #5 diabetes, poor control, Hemoglobin A1c 9.4% on 02/21/2024. Tresiba and Humalog per Dr. Faythe.  #6 preventative healthcare: vaccines: Shingrix->declines.  Tdap->declines.  Prevnar 20->declines. Breast cancer screening: She declines any further screening mammograms. Colon  cancer screening: Overdue for repeat colonoscopy as of December 2024. Osteoporosis screening: needs DEXA-> she states she had bone density done in the remote past and it was normal. She declines further screening  An After Visit Summary was printed and given to the patient.  FOLLOW UP: No follow-ups on file.  Signed:  Gerlene Hockey, MD           06/08/2024

## 2024-07-09 ENCOUNTER — Ambulatory Visit: Admitting: Family Medicine

## 2024-07-18 ENCOUNTER — Ambulatory Visit: Admitting: Family Medicine

## 2024-08-23 ENCOUNTER — Ambulatory Visit: Payer: Self-pay

## 2024-08-23 ENCOUNTER — Telehealth: Admitting: Family Medicine

## 2024-08-23 DIAGNOSIS — B9689 Other specified bacterial agents as the cause of diseases classified elsewhere: Secondary | ICD-10-CM | POA: Diagnosis not present

## 2024-08-23 DIAGNOSIS — J019 Acute sinusitis, unspecified: Secondary | ICD-10-CM

## 2024-08-23 MED ORDER — AZITHROMYCIN 250 MG PO TABS: ORAL_TABLET | ORAL | 0 refills | Status: AC

## 2024-08-23 NOTE — Progress Notes (Signed)
 " Virtual Visit Consent   Wendy Mcclain, you are scheduled for a virtual visit with a [facility] provider today. Just as with appointments in the office, your consent must be obtained to participate. Your consent will be active for this visit and any virtual visit you may have with one of our providers in the next 365 days. If you have a MyChart account, a copy of this consent can be sent to you electronically.  As this is a virtual visit, video technology does not allow for your provider to perform a traditional examination. This may limit your provider's ability to fully assess your condition. If your provider identifies any concerns that need to be evaluated in person or the need to arrange testing (such as labs, EKG, etc.), we will make arrangements to do so. Although advances in technology are sophisticated, we cannot ensure that it will always work on either your end or our end. If the connection with a video visit is poor, the visit may have to be switched to a telephone visit. With either a video or telephone visit, we are not always able to ensure that we have a secure connection.  By engaging in this virtual visit, you consent to the provision of healthcare and authorize for your insurance to be billed (if applicable) for the services provided during this visit. Depending on your insurance coverage, you may receive a charge related to this service.  I need to obtain your verbal consent now. Are you willing to proceed with your visit today? Wendy Mcclain has provided verbal consent on 08/23/2024 for a virtual visit (video or telephone). Chiquita CHRISTELLA Barefoot, NP  Date: 08/23/2024 12:24 PM   Virtual Visit via Video Note   I, Chiquita CHRISTELLA Barefoot, connected with  Wendy Mcclain  ([id number], Jun 16, 1951) on 08/23/24 at 12:15 PM EST by a video-enabled telemedicine application and verified that I am speaking with the correct person using two identifiers.  Location: Patient: Virtual Visit Location Patient:  Home Provider: Virtual Visit Location Provider: Home Office   I discussed the limitations of evaluation and management by telemedicine and the availability of in person appointments. The patient expressed understanding and agreed to proceed.    History of Present Illness: Wendy Mcclain is a 74 y.o. who identifies as a female who was assigned female at birth, and is being seen today for sinus congestion  Onset was in the last week, began to get more severe in the last several days.  Associated symptoms reported dry cough, headaches, sneezing at times is felt warm pulse ox is still good as per triage note Additionally reports a fever up to 101 Has been using her albuterol  inhaler, drinking green tea and warm fluids, Tylenol  and NyQuil. Reports not currently using her Astelin , Flonase , Zyrtec  as previously prescribed by PCP. Denies having any chest pain, shortness of breath, dizziness,  chills No current COVID and/or flu testing.  Notes to have asthma, gets bronchitis/symptoms of this nature with sinuses as well during the winter  Recently was on Augmentin  back in November.  Problems:  Patient Active Problem List   Diagnosis Date Noted   Actinic keratoses 01/16/2020   Grade 1 ankle sprain, right, initial encounter 04/07/2019   Asthma 10/23/2018   Bronchospasm, acute 10/20/2018   Neural foraminal stenosis of cervical spine 10/05/2018   Carpal tunnel syndrome of right wrist 10/05/2018   Right arm weakness 09/29/2018   Paresthesia    Arm weakness 09/28/2018   Anxiety state  06/19/2018   Tension headache 04/20/2017   Baker's cyst of knee, right 04/20/2017   Grief reaction 04/20/2017   Cerebrovascular accident (CVA) (HCC)    Hyperlipidemia    OSA (obstructive sleep apnea)    Gastro-esophageal reflux 03/20/2014   Allergic rhinitis 07/09/2013   Sleep disorder 07/09/2013   Elevated blood pressure reading without diagnosis of hypertension 07/09/2013   Mood disorder 11/23/2011    Diabetes mellitus (HCC) 11/23/2011   Health care maintenance 11/23/2011    Allergies: Allergies[1] Medications: Current Medications[2]  Observations/Objective: Patient is well-developed, well-nourished in no acute distress.  Resting comfortably  at home.  Head is normocephalic, atraumatic.  No labored breathing.  Speech is clear and coherent with logical content.  Patient is alert and oriented at baseline.    Assessment and Plan:  1. Acute bacterial sinusitis (Primary)  - azithromycin  (ZITHROMAX ) 250 MG tablet; Take 2 tablets on day 1, then 1 tablet daily on days 2 through 5  Dispense: 6 tablet; Refill: 0   Questionable bronchitis, sinus given symptom overlap. Was on Augmentin  in November, will do Zithromax  just to cover for possible pneumonia-given fever though cough is dry at this time Additionally educated on use of Flonase , Astelin , Zyrtec  to help manage symptoms of sinuses Encouraged to get hydration, rest Follow-up if not improving.  Reviewed side effects, risks and benefits of medication.    Patient acknowledged agreement and understanding of the plan.   Past Medical, Surgical, Social History, Allergies, and Medications have been Reviewed.    Follow Up Instructions: I discussed the assessment and treatment plan with the patient. The patient was provided an opportunity to ask questions and all were answered. The patient agreed with the plan and demonstrated an understanding of the instructions.  A copy of instructions were sent to the patient via MyChart unless otherwise noted below.    The patient was advised to call back or seek an in-person evaluation if the symptoms worsen or if the condition fails to improve as anticipated.    Chiquita CHRISTELLA Barefoot, NP     [1]  Allergies Allergen Reactions   Canagliflozin Other (See Comments)   Dulaglutide Other (See Comments)   Influenza Vaccine Live     Other Reaction(s): fever, swollen arm   Metformin  And Related Diarrhea   [2]  Current Outpatient Medications:    albuterol  (VENTOLIN  HFA) 108 (90 Base) MCG/ACT inhaler, Inhale 2 puffs into the lungs every 6 (six) hours as needed for wheezing or shortness of breath., Disp: 18 g, Rfl: 1   aspirin  EC 81 MG tablet, Take 1 tablet (81 mg total) by mouth daily. Swallow whole., Disp: 30 tablet, Rfl: 11   atorvastatin  (LIPITOR) 40 MG tablet, Take 1 tablet (40 mg total) by mouth daily., Disp: 90 tablet, Rfl: 3   azelastine  (ASTELIN ) 0.1 % nasal spray, Place 2 sprays into both nostrils 2 (two) times daily. Use in each nostril as directed, Disp: 30 mL, Rfl: 12   cetirizine  (ZYRTEC ) 10 MG tablet, TAKE 1 TABLET(10 MG) BY MOUTH DAILY, Disp: 90 tablet, Rfl: 3   Continuous Blood Gluc Sensor (FREESTYLE LIBRE 3 SENSOR) MISC, apply to upper arm, change sensor every 14 days . change sensor every 14 days for 84 days, Disp: , Rfl:    fluconazole  (DIFLUCAN ) 150 MG tablet, 1 tab po x 1 dose, repeat in 1 week, Disp: 2 tablet, Rfl: 0   fluticasone -salmeterol (ADVAIR DISKUS) 250-50 MCG/ACT AEPB, Inhale 1 puff into the lungs in the morning and at bedtime., Disp:  180 each, Rfl: 1   HUMALOG KWIKPEN 100 UNIT/ML KiwkPen, Inject 20 Units into the skin 3 (three) times daily before meals., Disp: , Rfl: 0   insulin  degludec (TRESIBA) 100 UNIT/ML FlexTouch Pen, Inject 18 Units into the skin at bedtime., Disp: , Rfl:    ipratropium (ATROVENT ) 0.03 % nasal spray, Place 2 sprays into both nostrils every 12 (twelve) hours., Disp: 30 mL, Rfl: 0   Multiple Vitamin (MULTIVITAMIN) tablet, Take 1 tablet by mouth See admin instructions. Twice weekly by mouth Monday and Wednesday, Disp: , Rfl:    omeprazole  (PRILOSEC) 20 MG capsule, Take 1 capsule (20 mg total) by mouth 3 (three) times a week., Disp: 90 capsule, Rfl: 3   traZODone  (DESYREL ) 50 MG tablet, 1-2 tabs po at bedtime as needed for insomnia, Disp: 30 tablet, Rfl: 0  "

## 2024-08-23 NOTE — Telephone Encounter (Signed)
 FYI Only or Action Required?: FYI only for provider: appointment scheduled on 1/22. VUC  Patient was last seen in primary care on 06/08/2024 by McGowen, Aleene DEL, MD.  Called Nurse Triage reporting Cough and Fever.  Symptoms began several days ago.  Interventions attempted: OTC medications: tylenol , nyquil.  Symptoms are: gradually worsening.  Triage Disposition: See Physician Within 24 Hours  Patient/caregiver understands and will follow disposition?: Yes  Message from [facility] ORN sent at 08/23/2024 11:04 AM EST  Reason for Triage: low grade fever, sneezing, deep chest coughing, nasal drainage and headache for the past 3 days.   Reason for Disposition  Fever present > 3 days (72 hours)  Answer Assessment - Initial Assessment Questions Starting 3 days ago- Dry cough, headaches, sneezing then developed into sinus congestion and deep cough. Fever 101 Asthma- inhaler- couple times a day - Pulse Ox watching and is good still  Hot tea- Tylenol -  Nyquil  Denies CP, SOB, Dizziness.   Patient unable to make it into an appt today due to transport- hoping to get something called in. VUC appt made and ED/UC precautions understood.   1. ONSET: When did the nasal discharge start?      3 days  2. AMOUNT: How much discharge is there?      Mild-moderate  3. COUGH: Do you have a cough? If Yes, ask: Describe the color of your mucus. (e.g., clear, white, yellow, green)     Light green yellow  4. RESPIRATORY DISTRESS: Describe your breathing.      Denies- just lots of coughing fits and inhaler use  5. FEVER: Do you have a fever? If Yes, ask: What is your temperature, how was it measured, and when did it start?     101 fever - warm to touch- yesterday last night  6. SEVERITY: Overall, how bad are you feeling right now? (e.g., doesn't interfere with normal activities, staying home from school/work, staying in bed)      Overall feeling bad-  7. OTHER SYMPTOMS: Do you have any other  symptoms? (e.g., earache, mouth sores, sore throat, wheezing)     Headache, dry cough, earache/irritation  Protocols used: Common Cold-A-AH

## 2024-08-23 NOTE — Patient Instructions (Addendum)
 " Wendy Mcclain, thank you for joining Wendy CHRISTELLA Barefoot, NP for today's virtual visit.  While this provider is not your primary care provider (PCP), if your PCP is located in our provider database this encounter information will be shared with them immediately following your visit.   A [facility] MyChart account gives you access to today's visit and all your visits, tests, and labs performed at [facility]  click here if you don't have a [facility] MyChart account or go to [email or web address]  Consent: (Patient) Wendy Mcclain provided verbal consent for this virtual visit at the beginning of the encounter.  Current Medications:  Current Outpatient Medications:    azithromycin  (ZITHROMAX ) 250 MG tablet, Take 2 tablets on day 1, then 1 tablet daily on days 2 through 5, Disp: 6 tablet, Rfl: 0   albuterol  (VENTOLIN  HFA) 108 (90 Base) MCG/ACT inhaler, Inhale 2 puffs into the lungs every 6 (six) hours as needed for wheezing or shortness of breath., Disp: 18 g, Rfl: 1   aspirin  EC 81 MG tablet, Take 1 tablet (81 mg total) by mouth daily. Swallow whole., Disp: 30 tablet, Rfl: 11   atorvastatin  (LIPITOR) 40 MG tablet, Take 1 tablet (40 mg total) by mouth daily., Disp: 90 tablet, Rfl: 3   azelastine  (ASTELIN ) 0.1 % nasal spray, Place 2 sprays into both nostrils 2 (two) times daily. Use in each nostril as directed, Disp: 30 mL, Rfl: 12   cetirizine  (ZYRTEC ) 10 MG tablet, TAKE 1 TABLET(10 MG) BY MOUTH DAILY, Disp: 90 tablet, Rfl: 3   Continuous Blood Gluc Sensor (FREESTYLE LIBRE 3 SENSOR) MISC, apply to upper arm, change sensor every 14 days . change sensor every 14 days for 84 days, Disp: , Rfl:    fluconazole  (DIFLUCAN ) 150 MG tablet, 1 tab po x 1 dose, repeat in 1 week, Disp: 2 tablet, Rfl: 0   fluticasone -salmeterol (ADVAIR DISKUS) 250-50 MCG/ACT AEPB, Inhale 1 puff into the lungs in the morning and at bedtime., Disp: 180 each, Rfl: 1   HUMALOG KWIKPEN 100 UNIT/ML KiwkPen, Inject 20  Units into the skin 3 (three) times daily before meals., Disp: , Rfl: 0   insulin  degludec (TRESIBA) 100 UNIT/ML FlexTouch Pen, Inject 18 Units into the skin at bedtime., Disp: , Rfl:    ipratropium (ATROVENT ) 0.03 % nasal spray, Place 2 sprays into both nostrils every 12 (twelve) hours., Disp: 30 mL, Rfl: 0   Multiple Vitamin (MULTIVITAMIN) tablet, Take 1 tablet by mouth See admin instructions. Twice weekly by mouth Monday and Wednesday, Disp: , Rfl:    omeprazole  (PRILOSEC) 20 MG capsule, Take 1 capsule (20 mg total) by mouth 3 (three) times a week., Disp: 90 capsule, Rfl: 3   traZODone  (DESYREL ) 50 MG tablet, 1-2 tabs po at bedtime as needed for insomnia, Disp: 30 tablet, Rfl: 0   Medications ordered in this encounter:  Meds ordered this encounter  Medications   azithromycin  (ZITHROMAX ) 250 MG tablet    Sig: Take 2 tablets on day 1, then 1 tablet daily on days 2 through 5    Dispense:  6 tablet    Refill:  0    Supervising Provider:   LAMPTEY, PHILIP O [phone]     *If you need refills on other medications prior to your next appointment, please contact your pharmacy*  Follow-Up: Call back or seek an in-person evaluation if the symptoms worsen or if the condition fails to improve as anticipated.  [facility] [phone]  Other Instructions   - Take meds as prescribed - Rest voice - Use a cool mist humidifier especially during the winter months when heat dries out the air. - Use saline nose sprays frequently to help soothe nasal passages if they are drying out. - Stay hydrated by drinking plenty of fluids - Keep thermostat turn down low to prevent drying out which can cause a dry cough.  -Additionally educated on use of Flonase , Astelin , Zyrtec  to help manage symptoms of sinuses  If you do not improve you will need a follow up visit in person.                 If you have been instructed to have an in-person evaluation today at a local Urgent Care facility,  please use the link below. It will take you to a list of all of our available [facility], including address, phone number and hours of operation. Please do not delay care.  [facility]  If you or a family member do not have a primary care provider, use the link below to schedule a visit and establish care. When you choose a [facility] primary care physician or advanced practice provider, you gain a long-term partner in health. Find a Primary Care Provider  Learn more about [facility]'s in-office and virtual care options: [facility] - Get Care Now  "

## 2024-08-31 NOTE — Progress Notes (Signed)
 This encounter was created in error - please disregard.

## 2024-10-10 ENCOUNTER — Ambulatory Visit
